# Patient Record
Sex: Female | Born: 1945
Health system: Southern US, Community
[De-identification: ages and names within clinical notes are randomized; demographics above are authoritative.]

## PROBLEM LIST (undated history)

## (undated) DIAGNOSIS — Z808 Family history of malignant neoplasm of other organs or systems: Secondary | ICD-10-CM

## (undated) DIAGNOSIS — R011 Cardiac murmur, unspecified: Secondary | ICD-10-CM

## (undated) DIAGNOSIS — C541 Malignant neoplasm of endometrium: Secondary | ICD-10-CM

## (undated) DIAGNOSIS — IMO0001 Reserved for inherently not codable concepts without codable children: Secondary | ICD-10-CM

## (undated) DIAGNOSIS — Z803 Family history of malignant neoplasm of breast: Secondary | ICD-10-CM

## (undated) DIAGNOSIS — E785 Hyperlipidemia, unspecified: Secondary | ICD-10-CM

## (undated) DIAGNOSIS — J302 Other seasonal allergic rhinitis: Secondary | ICD-10-CM

## (undated) DIAGNOSIS — I10 Essential (primary) hypertension: Secondary | ICD-10-CM

## (undated) DIAGNOSIS — IMO0002 Reserved for concepts with insufficient information to code with codable children: Secondary | ICD-10-CM

## (undated) HISTORY — DX: Essential (primary) hypertension: I10

## (undated) HISTORY — DX: Hyperlipidemia, unspecified: E78.5

## (undated) HISTORY — PX: OTHER SURGICAL HISTORY: SHX169

## (undated) HISTORY — PX: WISDOM TOOTH EXTRACTION: SHX21

## (undated) HISTORY — DX: Family history of malignant neoplasm of other organs or systems: Z80.8

## (undated) HISTORY — PX: TONSILLECTOMY: SUR1361

## (undated) HISTORY — DX: Cardiac murmur, unspecified: R01.1

## (undated) HISTORY — DX: Malignant neoplasm of endometrium: C54.1

## (undated) HISTORY — PX: BREAST SURGERY: SHX581

## (undated) HISTORY — DX: Family history of malignant neoplasm of breast: Z80.3

---

## 1998-06-07 ENCOUNTER — Other Ambulatory Visit: Admission: RE | Admit: 1998-06-07 | Discharge: 1998-06-07 | Payer: Self-pay | Admitting: *Deleted

## 1999-02-12 ENCOUNTER — Other Ambulatory Visit: Admission: RE | Admit: 1999-02-12 | Discharge: 1999-02-12 | Payer: Self-pay | Admitting: *Deleted

## 1999-08-19 ENCOUNTER — Other Ambulatory Visit: Admission: RE | Admit: 1999-08-19 | Discharge: 1999-08-19 | Payer: Self-pay | Admitting: *Deleted

## 1999-12-18 ENCOUNTER — Ambulatory Visit (HOSPITAL_COMMUNITY): Admission: RE | Admit: 1999-12-18 | Discharge: 1999-12-18 | Payer: Self-pay | Admitting: Gastroenterology

## 2000-02-12 ENCOUNTER — Other Ambulatory Visit: Admission: RE | Admit: 2000-02-12 | Discharge: 2000-02-12 | Payer: Self-pay | Admitting: *Deleted

## 2000-09-23 ENCOUNTER — Other Ambulatory Visit: Admission: RE | Admit: 2000-09-23 | Discharge: 2000-09-23 | Payer: Self-pay | Admitting: *Deleted

## 2001-10-19 ENCOUNTER — Other Ambulatory Visit: Admission: RE | Admit: 2001-10-19 | Discharge: 2001-10-19 | Payer: Self-pay | Admitting: *Deleted

## 2002-10-25 ENCOUNTER — Other Ambulatory Visit: Admission: RE | Admit: 2002-10-25 | Discharge: 2002-10-25 | Payer: Self-pay | Admitting: *Deleted

## 2003-10-30 ENCOUNTER — Other Ambulatory Visit: Admission: RE | Admit: 2003-10-30 | Discharge: 2003-10-30 | Payer: Self-pay | Admitting: *Deleted

## 2004-02-25 ENCOUNTER — Encounter: Admission: RE | Admit: 2004-02-25 | Discharge: 2004-02-25 | Payer: Self-pay | Admitting: Internal Medicine

## 2005-03-06 ENCOUNTER — Ambulatory Visit (HOSPITAL_COMMUNITY): Admission: RE | Admit: 2005-03-06 | Discharge: 2005-03-06 | Payer: Self-pay | Admitting: Gastroenterology

## 2008-04-13 ENCOUNTER — Ambulatory Visit: Payer: Self-pay | Admitting: Internal Medicine

## 2008-08-24 ENCOUNTER — Ambulatory Visit: Payer: Self-pay | Admitting: Internal Medicine

## 2009-02-15 ENCOUNTER — Ambulatory Visit: Payer: Self-pay | Admitting: Internal Medicine

## 2009-05-26 ENCOUNTER — Emergency Department (HOSPITAL_BASED_OUTPATIENT_CLINIC_OR_DEPARTMENT_OTHER): Admission: EM | Admit: 2009-05-26 | Discharge: 2009-05-27 | Payer: Self-pay | Admitting: Emergency Medicine

## 2009-05-27 ENCOUNTER — Ambulatory Visit: Payer: Self-pay | Admitting: Interventional Radiology

## 2009-05-27 ENCOUNTER — Ambulatory Visit: Payer: Self-pay | Admitting: Internal Medicine

## 2009-05-31 ENCOUNTER — Ambulatory Visit (HOSPITAL_COMMUNITY): Admission: RE | Admit: 2009-05-31 | Discharge: 2009-05-31 | Payer: Self-pay | Admitting: Internal Medicine

## 2009-06-06 ENCOUNTER — Ambulatory Visit: Payer: Self-pay | Admitting: Internal Medicine

## 2009-06-25 ENCOUNTER — Ambulatory Visit: Payer: Self-pay | Admitting: Internal Medicine

## 2009-08-20 ENCOUNTER — Ambulatory Visit: Payer: Self-pay | Admitting: Internal Medicine

## 2009-09-20 ENCOUNTER — Ambulatory Visit: Payer: Self-pay | Admitting: Internal Medicine

## 2009-11-21 ENCOUNTER — Ambulatory Visit: Payer: Self-pay | Admitting: Internal Medicine

## 2010-02-20 ENCOUNTER — Ambulatory Visit: Payer: Self-pay | Admitting: Internal Medicine

## 2010-03-28 ENCOUNTER — Ambulatory Visit: Payer: Self-pay | Admitting: Internal Medicine

## 2010-06-09 ENCOUNTER — Ambulatory Visit: Payer: Self-pay | Admitting: Internal Medicine

## 2010-09-01 ENCOUNTER — Other Ambulatory Visit: Payer: Self-pay | Admitting: Internal Medicine

## 2010-09-02 ENCOUNTER — Ambulatory Visit (INDEPENDENT_AMBULATORY_CARE_PROVIDER_SITE_OTHER): Payer: BC Managed Care – PPO | Admitting: Internal Medicine

## 2010-09-02 DIAGNOSIS — E785 Hyperlipidemia, unspecified: Secondary | ICD-10-CM

## 2010-09-02 DIAGNOSIS — I10 Essential (primary) hypertension: Secondary | ICD-10-CM

## 2010-10-01 LAB — DIFFERENTIAL
Basophils Absolute: 0.1 10*3/uL (ref 0.0–0.1)
Basophils Relative: 2 % — ABNORMAL HIGH (ref 0–1)
Eosinophils Absolute: 0.1 10*3/uL (ref 0.0–0.7)
Eosinophils Relative: 2 % (ref 0–5)
Lymphocytes Relative: 32 % (ref 12–46)
Lymphs Abs: 2.4 10*3/uL (ref 0.7–4.0)
Monocytes Absolute: 0.7 10*3/uL (ref 0.1–1.0)
Monocytes Relative: 9 % (ref 3–12)
Neutro Abs: 4.1 10*3/uL (ref 1.7–7.7)
Neutrophils Relative %: 56 % (ref 43–77)

## 2010-10-01 LAB — POCT CARDIAC MARKERS
CKMB, poc: 1.1 ng/mL (ref 1.0–8.0)
CKMB, poc: <1 ng/mL — ABNORMAL LOW (ref 1.0–8.0)
Myoglobin, poc: 33 ng/mL (ref 12–200)
Myoglobin, poc: 51.4 ng/mL (ref 12–200)
Troponin i, poc: <0.05 ng/mL (ref 0.00–0.09)
Troponin i, poc: <0.05 ng/mL (ref 0.00–0.09)

## 2010-10-01 LAB — BASIC METABOLIC PANEL
BUN: 15 mg/dL (ref 6–23)
CO2: 26 mEq/L (ref 19–32)
Calcium: 9.7 mg/dL (ref 8.4–10.5)
Chloride: 104 mEq/L (ref 96–112)
Creatinine, Ser: 0.7 mg/dL (ref 0.4–1.2)
GFR calc Af Amer: >60 mL/min (ref >60)
GFR calc non Af Amer: >60 mL/min (ref >60)
Glucose, Bld: 110 mg/dL — ABNORMAL HIGH (ref 70–99)
Potassium: 4.7 mEq/L (ref 3.5–5.1)
Sodium: 142 mEq/L (ref 135–145)

## 2010-10-01 LAB — CBC
HCT: 43.3 % (ref 36.0–46.0)
Hemoglobin: 14.2 g/dL (ref 12.0–15.0)
MCHC: 32.9 g/dL (ref 30.0–36.0)
MCV: 93.8 fL (ref 78.0–100.0)
Platelets: 248 10*3/uL (ref 150–400)
RBC: 4.62 MIL/uL (ref 3.87–5.11)
RDW: 12.2 % (ref 11.5–15.5)
WBC: 7.4 10*3/uL (ref 4.0–10.5)

## 2010-11-14 NOTE — Procedures (Signed)
[place]. [facility]  Patient:    Annette Tucker, Annette Tucker                          MRN: [id number] Proc. Date: 12/18/99 Adm. Date:  [id number] Disc. Date: [id number] Attending:  Charna Elizabeth CC:         Heather Roberts, M.D.                           Procedure Report  DATE OF BIRTH:  10/14/2045  PROCEDURE PERFORMED:  Flexible sigmoidoscopy.  ENDOSCOPIST:  Anselmo Rod, M.D.  INSTRUMENT USED:  Olympus video colonoscope.  INDICATIONS:  Screening flexible sigmoidoscopy being performed in a 65 year old, healthy, white female, rule out colonic polyps, masses, and hemorrhoids.  PREPROCEDURE PREPARATION:  Informed consent was procured.  The patient was fasted for eight hours prior to the procedure and prepped with two Fleets enemas the morning of the procedure.  DESCRIPTION OF PROCEDURE:  A flexible sigmoidoscopy was attempted and there was a large amount of stool in the colon.  Therefore, the scope had to be withdrawn and the patient was reprepped with an additional two Fleets enemas. Once the patient was adequately prepped, she was placed in the left lateral decubitus position.  The Olympus video colonoscope was advanced from the rectum to 80 cm without difficulty.  No masses, polyps, erosions, ulcerations, or diverticula were seen.  The visualization was adequate.  The colonic mucosa appeared healthy and without lesions.  The patient tolerated the procedure well without complications.  A small nonbleeding internal hemorrhoid was seen in retroflexion.  IMPRESSION:  Normal flexible sigmoidoscopy, except for small internal hemorrhoids.  RECOMMENDATIONS:  The patient had been advised to increase the fluids and fiber in her diet and have repeat colorectal cancer screening in the next five years, sooner if she has any symptoms in the interim. DD:  12/18/99 TD:  12/20/99 Job: [id number]

## 2010-11-14 NOTE — Op Note (Signed)
Annette Tucker, Annette Tucker                   ACCOUNT NO.:  [id number]   MEDICAL RECORD NO.:  [id number]          PATIENT TYPE:  AMB   LOCATION:  ENDO                         FACILITY:  [facility]   PHYSICIAN:  Anselmo Rod, M.D.  DATE OF BIRTH:  1946/02/15   DATE OF PROCEDURE:  03/06/2005  DATE OF DISCHARGE:                                 OPERATIVE REPORT   PROCEDURE:  Screening colonoscopy.   ENDOSCOPIST:  Anselmo Rod, M.D.   INSTRUMENT USED:  Olympus video colonoscope.   INDICATIONS FOR PROCEDURE:  A 65 year old white female underwent a screening  colonoscopy to rule out colonic polyps, masses, etc.   PRE-PROCEDURE PREPARATION:  An informed consent was procured from the  patient.  The patient was fasted for eight hours prior to the procedure and  prepped with a bottle of magnesium citrate and one gallon of GoLYTELY on the  night prior to the procedure.  The risks and benefits of the procedure,  including a 10% mid-rate of cancer and polyps were discussed with the  patient as well.   PRE-PROCEDURE PHYSICAL EXAMINATION:  VITAL SIGNS:  Stable.  NECK:  Supple.  CHEST:  Clear to auscultation.  HEART:  S1, S2 regular.  ABDOMEN:  Soft with normal bowel sounds.   DESCRIPTION OF PROCEDURE:  The patient was placed in the left lateral  decubitus position and sedated with 75 mg of Demerol and 7.5 mg of Versed in  slow incremental doses.  She also received 1 gram of Ancef for mitral valve  prolapse prophylaxis. Once the patient was adequately sedated and maintained  on low-flow oxygen and continuous cardiac monitoring, the Olympus video  colonoscope was advanced from the rectum to the cecum.  The appendicular  orifice and the ileocecal valve were clearly visualized and photographed.  No masses, polyps, erosions, ulcerations or diverticula were seen.  Retroflexion in the rectum revealed no abnormalities.  The patient had a  somewhat tortuous colon.  The patient's position had to be changed from  the  left lateral to the supine position.  With gentle application of abdominal  pressure, we reached the cecal base.   The patient tolerated the procedure well without immediate complications.   IMPRESSION:  1.  Normal colonoscopy to the cecum.  No masses or polyps seen.  No evidence      of diverticulosis.  2.  Tortuous colon.   RECOMMENDATIONS:  1.  Continue a high-fiber diet with liberal fluid intake.  2.  Repeat colonoscopy in the next 10 years, unless the patient develops any      abnormal symptoms in the interim.  3.  Outpatient followup as the need arises in the future.      Anselmo Rod, M.D.  Electronically Signed     JNM/MEDQ  D:  03/06/2005  T:  03/06/2005  Job:  [id number]   cc:   Luanna Cole. Lenord Fellers, M.D.  [address]  [place]  [place] [address]  Fax: [phone]   Pershing Cox, M.D.  [address]  [place]  [place] [address]  Fax:  [phone] 

## 2011-02-26 ENCOUNTER — Other Ambulatory Visit: Payer: BC Managed Care – PPO | Admitting: Internal Medicine

## 2011-02-26 DIAGNOSIS — Z0000 Encounter for general adult medical examination without abnormal findings: Secondary | ICD-10-CM

## 2011-02-26 DIAGNOSIS — E785 Hyperlipidemia, unspecified: Secondary | ICD-10-CM

## 2011-02-26 LAB — HEPATIC FUNCTION PANEL
ALT: 13 U/L (ref 0–35)
AST: 19 U/L (ref 0–37)
Albumin: 4.4 g/dL (ref 3.5–5.2)
Alkaline Phosphatase: 63 U/L (ref 39–117)
Bilirubin, Direct: 0.1 mg/dL (ref 0.0–0.3)
Indirect Bilirubin: 0.4 mg/dL (ref 0.0–0.9)
Total Bilirubin: 0.5 mg/dL (ref 0.3–1.2)
Total Protein: 7 g/dL (ref 6.0–8.3)

## 2011-02-26 LAB — CBC WITH DIFFERENTIAL/PLATELET
Basophils Absolute: 0 10*3/uL (ref 0.0–0.1)
Basophils Relative: 0 % (ref 0–1)
Eosinophils Absolute: 0.1 10*3/uL (ref 0.0–0.7)
Eosinophils Relative: 2 % (ref 0–5)
HCT: 42.5 % (ref 36.0–46.0)
Hemoglobin: 13.7 g/dL (ref 12.0–15.0)
Lymphocytes Relative: 38 % (ref 12–46)
Lymphs Abs: 2.1 10*3/uL (ref 0.7–4.0)
MCH: 29.8 pg (ref 26.0–34.0)
MCHC: 32.2 g/dL (ref 30.0–36.0)
MCV: 92.4 fL (ref 78.0–100.0)
Monocytes Absolute: 0.6 10*3/uL (ref 0.1–1.0)
Monocytes Relative: 11 % (ref 3–12)
Neutro Abs: 2.6 10*3/uL (ref 1.7–7.7)
Neutrophils Relative %: 49 % (ref 43–77)
Platelets: 238 10*3/uL (ref 150–400)
RBC: 4.6 MIL/uL (ref 3.87–5.11)
RDW: 13.9 % (ref 11.5–15.5)
WBC: 5.4 10*3/uL (ref 4.0–10.5)

## 2011-02-26 LAB — LIPID PANEL
Cholesterol: 191 mg/dL (ref 0–200)
HDL: 71 mg/dL (ref >39)
LDL Cholesterol: 105 mg/dL — ABNORMAL HIGH (ref 0–99)
Total CHOL/HDL Ratio: 2.7 Ratio
Triglycerides: 76 mg/dL (ref <150)
VLDL: 15 mg/dL (ref 0–40)

## 2011-02-26 LAB — BASIC METABOLIC PANEL WITH GFR
BUN: 15 mg/dL (ref 6–23)
CO2: 25 meq/L (ref 19–32)
Calcium: 9.5 mg/dL (ref 8.4–10.5)
Chloride: 103 meq/L (ref 96–112)
Creat: 0.78 mg/dL (ref 0.50–1.10)
Glucose, Bld: 91 mg/dL (ref 70–99)
Potassium: 4.2 meq/L (ref 3.5–5.3)
Sodium: 140 meq/L (ref 135–145)

## 2011-02-26 LAB — TSH: TSH: 2.473 u[IU]/mL (ref 0.350–4.500)

## 2011-02-27 ENCOUNTER — Encounter: Payer: Self-pay | Admitting: Internal Medicine

## 2011-02-27 ENCOUNTER — Ambulatory Visit (INDEPENDENT_AMBULATORY_CARE_PROVIDER_SITE_OTHER): Payer: BC Managed Care – PPO | Admitting: Internal Medicine

## 2011-02-27 VITALS — BP 124/86 | HR 72 | Temp 98.3°F | Ht 62.75 in | Wt 122.0 lb

## 2011-02-27 DIAGNOSIS — I059 Rheumatic mitral valve disease, unspecified: Secondary | ICD-10-CM

## 2011-02-27 DIAGNOSIS — E785 Hyperlipidemia, unspecified: Secondary | ICD-10-CM | POA: Insufficient documentation

## 2011-02-27 DIAGNOSIS — M8580 Other specified disorders of bone density and structure, unspecified site: Secondary | ICD-10-CM | POA: Insufficient documentation

## 2011-02-27 DIAGNOSIS — M1990 Unspecified osteoarthritis, unspecified site: Secondary | ICD-10-CM

## 2011-02-27 DIAGNOSIS — I341 Nonrheumatic mitral (valve) prolapse: Secondary | ICD-10-CM | POA: Insufficient documentation

## 2011-02-27 DIAGNOSIS — I10 Essential (primary) hypertension: Secondary | ICD-10-CM | POA: Insufficient documentation

## 2011-02-27 DIAGNOSIS — M899 Disorder of bone, unspecified: Secondary | ICD-10-CM

## 2011-02-27 DIAGNOSIS — Z0000 Encounter for general adult medical examination without abnormal findings: Secondary | ICD-10-CM

## 2011-02-27 LAB — POCT URINALYSIS DIPSTICK
Bilirubin, UA: NEGATIVE
Blood, UA: NEGATIVE
Glucose, UA: NEGATIVE
Ketones, UA: NEGATIVE
Leukocytes, UA: NEGATIVE
Nitrite, UA: NEGATIVE
Protein, UA: NEGATIVE
Spec Grav, UA: 1.01
Urobilinogen, UA: NEGATIVE
pH, UA: 6.5

## 2011-02-27 LAB — VITAMIN D 25 HYDROXY (VIT D DEFICIENCY, FRACTURES): Vit D, 25-Hydroxy: 54 ng/mL (ref 30–89)

## 2011-02-27 NOTE — Progress Notes (Signed)
  Subjective:    Patient ID: Annette Tucker, female    DOB: Jun 12, 1946, 65 y.o.   MRN: [id number]  HPI  and in a 75 white female with history of osteoarthritis hips and back, mitral valve prolapse diagnosed in 2001, osteopenia, hyperlipidemia and hypertension in today for evaluation of medical problems. Patient has been maintained on Zocor 20 mg daily, amlodipine 5 mg daily, Altace 5 mg daily, calcium and vitamin D supplementation as well as a baby aspirin daily. Says at times when she checks her blood pressure at time it can be in the low 100s. Sometimes she feels fatigued and falls asleep easily. Explained to her she could try discontinuing Altace for a few weeks to see if that would help percent films; however, we tried discontinuing amlodipine a while back and her blood pressure became elevated once again. I am pleased with her blood pressure today. It is exactly were a wanted. She had a mammogram April 2012, influenza vaccine September 2011, Pneumovax immunization 2003, shingles vaccine September 2011.  Had tonsillectomy at age 45, TMJ mandibular advancement surgery 1986  No other complaints or problems. Fasting lab work within normal limits    Review of Systems  Constitutional: Positive for fatigue.  HENT: Negative.   Eyes: Negative.   Respiratory: Negative.   Cardiovascular: Negative.   Gastrointestinal: Negative.   Genitourinary: Positive for vaginal bleeding.       Patient complains of having vaginal bleeding while using Premarin vaginal cream. She saw GYN physician, Dr. Algie Coffer who did some testing. Patient was to go back for an endometrial biopsy but says when she quit using Premarin vaginal cream to vaginal bleeding stopped  Musculoskeletal: Negative.   Neurological: Negative.   Hematological: Negative.   Psychiatric/Behavioral: Negative.        Objective:   Physical Exam  Vitals reviewed. Constitutional: She is oriented to person, place, and time. She appears well-nourished.  No distress.  HENT:  Head: Normocephalic and atraumatic.  Right Ear: External ear normal.  Mouth/Throat: Oropharynx is clear and moist.  Neck: Neck supple. No JVD present. No thyromegaly present.  Cardiovascular: Normal rate, regular rhythm, normal heart sounds and intact distal pulses.   No murmur heard. Pulmonary/Chest: Effort normal and breath sounds normal. No respiratory distress. She has no rales.  Abdominal: Soft. Bowel sounds are normal. She exhibits no distension and no mass. There is no rebound.  Genitourinary:       Pelvic exam deferred to GYN physician  Musculoskeletal: Normal range of motion. She exhibits no edema.  Lymphadenopathy:    She has no cervical adenopathy.  Neurological: She is alert and oriented to person, place, and time. She has normal reflexes. No cranial nerve deficit. Coordination normal.  Skin: Skin is warm and dry. No rash noted.  Psychiatric: She has a normal mood and affect.          Assessment & Plan:  Hypertension  Hyperlipidemia  Osteopenia  History of mitral valve prolapse  History of osteoarthritis hips and back  For now, advise continue with same medication regimen. Return in 6 months for fasting lipid panel liver functions and office visit.  Patient had colonoscopy 2006 and next one is due 2016. Had bone density study done in GYN office 2009.

## 2011-03-25 ENCOUNTER — Ambulatory Visit (INDEPENDENT_AMBULATORY_CARE_PROVIDER_SITE_OTHER): Payer: BC Managed Care – PPO | Admitting: Internal Medicine

## 2011-03-25 DIAGNOSIS — Z23 Encounter for immunization: Secondary | ICD-10-CM

## 2011-04-20 ENCOUNTER — Encounter: Payer: Self-pay | Admitting: Internal Medicine

## 2011-08-31 ENCOUNTER — Other Ambulatory Visit: Payer: BC Managed Care – PPO | Admitting: Internal Medicine

## 2011-08-31 DIAGNOSIS — E785 Hyperlipidemia, unspecified: Secondary | ICD-10-CM

## 2011-08-31 DIAGNOSIS — Z79899 Other long term (current) drug therapy: Secondary | ICD-10-CM

## 2011-09-01 ENCOUNTER — Ambulatory Visit (INDEPENDENT_AMBULATORY_CARE_PROVIDER_SITE_OTHER): Payer: BC Managed Care – PPO | Admitting: Internal Medicine

## 2011-09-01 ENCOUNTER — Encounter: Payer: Self-pay | Admitting: Internal Medicine

## 2011-09-01 DIAGNOSIS — M549 Dorsalgia, unspecified: Secondary | ICD-10-CM

## 2011-09-01 DIAGNOSIS — I10 Essential (primary) hypertension: Secondary | ICD-10-CM

## 2011-09-01 DIAGNOSIS — E785 Hyperlipidemia, unspecified: Secondary | ICD-10-CM

## 2011-09-01 LAB — LIPID PANEL
Cholesterol: 167 mg/dL (ref 0–200)
HDL: 69 mg/dL (ref >39)
LDL Cholesterol: 85 mg/dL (ref 0–99)
Total CHOL/HDL Ratio: 2.4 Ratio
Triglycerides: 63 mg/dL (ref <150)
VLDL: 13 mg/dL (ref 0–40)

## 2011-09-01 LAB — HEPATIC FUNCTION PANEL
ALT: 13 U/L (ref 0–35)
AST: 25 U/L (ref 0–37)
Albumin: 4.4 g/dL (ref 3.5–5.2)
Alkaline Phosphatase: 83 U/L (ref 39–117)
Bilirubin, Direct: 0.1 mg/dL (ref 0.0–0.3)
Indirect Bilirubin: 0.4 mg/dL (ref 0.0–0.9)
Total Bilirubin: 0.5 mg/dL (ref 0.3–1.2)
Total Protein: 7.2 g/dL (ref 6.0–8.3)

## 2011-09-01 NOTE — Progress Notes (Signed)
  Subjective:    Patient ID: Annette Tucker, female    DOB: [birth year], 66 y.o.   MRN: [id number]  HPI 66 year old white female with history of hypertension and hyperlipidemia in today for six-month recheck. Has some occasional low back pain. Walks 2 miles daily on a treadmill. Went to see dermatologist and had some lesions removed that were benign. Sees Dr. Algie Coffer at [facility] for GYN care. Had some vaginal discharge. Stopped using Premarin vaginal cream and discharge stopped. Immunizations are up-to-date. Has had colonoscopy.    Review of Systems     Objective:   Physical Exam neck is supple without thyromegaly or carotid bruits; chest clear to auscultation; cardiac exam regular rate and rhythm; extremities without edema.        Assessment & Plan:  Hypertension  Hyperlipidemia  Plan: Continue same regimen and return in 6 months for physical examination. Lab work reviewed with her today including lipid panel and liver functions entirely within normal limits.

## 2011-09-01 NOTE — Patient Instructions (Signed)
Continue same medications. Return in early September for physical examination.

## 2011-09-29 ENCOUNTER — Encounter (HOSPITAL_BASED_OUTPATIENT_CLINIC_OR_DEPARTMENT_OTHER): Payer: Self-pay | Admitting: *Deleted

## 2011-09-29 ENCOUNTER — Emergency Department (HOSPITAL_BASED_OUTPATIENT_CLINIC_OR_DEPARTMENT_OTHER)
Admission: EM | Admit: 2011-09-29 | Discharge: 2011-09-29 | Disposition: A | Discharge status: Home / Self Care | Payer: Worker's Compensation | Attending: Emergency Medicine | Admitting: Emergency Medicine

## 2011-09-29 ENCOUNTER — Emergency Department (INDEPENDENT_AMBULATORY_CARE_PROVIDER_SITE_OTHER): Payer: Worker's Compensation

## 2011-09-29 DIAGNOSIS — M25569 Pain in unspecified knee: Secondary | ICD-10-CM | POA: Insufficient documentation

## 2011-09-29 DIAGNOSIS — W1809XA Striking against other object with subsequent fall, initial encounter: Secondary | ICD-10-CM

## 2011-09-29 DIAGNOSIS — I10 Essential (primary) hypertension: Secondary | ICD-10-CM | POA: Insufficient documentation

## 2011-09-29 DIAGNOSIS — S8000XA Contusion of unspecified knee, initial encounter: Secondary | ICD-10-CM | POA: Insufficient documentation

## 2011-09-29 DIAGNOSIS — Z79899 Other long term (current) drug therapy: Secondary | ICD-10-CM | POA: Insufficient documentation

## 2011-09-29 DIAGNOSIS — W19XXXA Unspecified fall, initial encounter: Secondary | ICD-10-CM

## 2011-09-29 DIAGNOSIS — R040 Epistaxis: Secondary | ICD-10-CM

## 2011-09-29 DIAGNOSIS — Z7982 Long term (current) use of aspirin: Secondary | ICD-10-CM | POA: Insufficient documentation

## 2011-09-29 DIAGNOSIS — R220 Localized swelling, mass and lump, head: Secondary | ICD-10-CM | POA: Insufficient documentation

## 2011-09-29 DIAGNOSIS — IMO0002 Reserved for concepts with insufficient information to code with codable children: Secondary | ICD-10-CM | POA: Insufficient documentation

## 2011-09-29 DIAGNOSIS — S0990XA Unspecified injury of head, initial encounter: Secondary | ICD-10-CM

## 2011-09-29 DIAGNOSIS — R51 Headache: Secondary | ICD-10-CM | POA: Insufficient documentation

## 2011-09-29 DIAGNOSIS — W101XXA Fall (on)(from) sidewalk curb, initial encounter: Secondary | ICD-10-CM | POA: Insufficient documentation

## 2011-09-29 DIAGNOSIS — S0993XA Unspecified injury of face, initial encounter: Secondary | ICD-10-CM

## 2011-09-29 DIAGNOSIS — E785 Hyperlipidemia, unspecified: Secondary | ICD-10-CM | POA: Insufficient documentation

## 2011-09-29 DIAGNOSIS — M810 Age-related osteoporosis without current pathological fracture: Secondary | ICD-10-CM | POA: Insufficient documentation

## 2011-09-29 DIAGNOSIS — M129 Arthropathy, unspecified: Secondary | ICD-10-CM | POA: Insufficient documentation

## 2011-09-29 DIAGNOSIS — S0003XA Contusion of scalp, initial encounter: Secondary | ICD-10-CM | POA: Insufficient documentation

## 2011-09-29 DIAGNOSIS — S0083XA Contusion of other part of head, initial encounter: Secondary | ICD-10-CM

## 2011-09-29 MED ORDER — IBUPROFEN 800 MG PO TABS: 800.0000 mg | ORAL_TABLET | Freq: Three times a day (TID) | ORAL | Status: AC

## 2011-09-29 MED ORDER — TETANUS-DIPHTH-ACELL PERTUSSIS 5-2.5-18.5 LF-MCG/0.5 IM SUSP
0.5000 mL | Freq: Once | INTRAMUSCULAR | Status: AC
  Administered 2011-09-29: 0.5 mL via INTRAMUSCULAR
  Filled 2011-09-29: qty 0.5

## 2011-09-29 NOTE — ED Provider Notes (Signed)
History     CSN: [id number]  Arrival date & time 09/29/11  1733   First MD Initiated Contact with Patient 09/29/11 1803      Chief Complaint  Patient presents with  . Abrasion  . Epistaxis    (Consider location/radiation/quality/duration/timing/severity/associated sxs/prior treatment) HPI Comments: Patient a mechanical fall this afternoon when she tripped on a curb and scraped her face and left knee on the sidewalk. She remembers incident and that was consciousness. She complains of nose pain and abrasion to her for head and left knee. She did have some nosebleed initially that has since resolved. Denies any chest pain, shortness of breath no abdominal pain, back pain. She's no pain in her neck, numbness, tingling or weakness. She denies any dizziness or lightheadedness prior to the fall.  The history is provided by the patient.    Past Medical History  Diagnosis Date  . Arthritis   . Heart murmur     mitralvalve prolapse  . Osteoporosis     osteopenia  . Hyperlipidemia   . Hypertension     Past Surgical History  Procedure Date  . Tonsillectomy     age 34  . Tmj mandibular advancement     Family History  Problem Relation Age of Onset  . Mental retardation Mother   . Stroke Mother   . Kidney disease Mother     History  Substance Use Topics  . Smoking status: Never Smoker   . Smokeless tobacco: Never Used  . Alcohol Use: Yes     rarely    OB History    Grav Para Term Preterm Abortions TAB SAB Ect Mult Living                  Review of Systems  Constitutional: Negative for fever.  HENT: Positive for nosebleeds and facial swelling. Negative for neck pain and neck stiffness.   Eyes: Negative for photophobia.  Respiratory: Negative for cough, chest tightness and shortness of breath.   Cardiovascular: Negative for chest pain.  Gastrointestinal: Negative for nausea, vomiting and abdominal pain.  Genitourinary: Negative for dysuria.  Musculoskeletal: Positive  for joint swelling. Negative for back pain.  Skin: Negative for rash.  Neurological: Positive for headaches.    Allergies  Latex  Home Medications   Current Outpatient Rx  Name Route Sig Dispense Refill  . ACETAMINOPHEN 500 MG PO TABS Oral Take 500 mg by mouth once as needed. For pain    . AMLODIPINE BESYLATE 5 MG PO TABS Oral Take 5 mg by mouth daily.     . ASPIRIN 81 MG PO TBEC Oral Take 81 mg by mouth every evening.     Marland Kitchen CALCIUM CARBONATE 600 MG PO TABS Oral Take 600 mg by mouth 2 (two) times daily with a meal.      . VITAMIN D 1000 UNITS PO TABS Oral Take 1,000 Units by mouth daily.    . CO Q-10 100 MG PO CAPS Oral Take 1 capsule by mouth daily.    . OMEGA-3 FATTY ACIDS 1000 MG PO CAPS Oral Take 1 g by mouth daily.     Marland Kitchen FLAX SEED OIL PO Oral Take 1 capsule by mouth every evening.     Marland Kitchen LORATADINE 10 MG PO TABS Oral Take 10 mg by mouth daily.    Marland Kitchen MAGNESIUM 250 MG PO TABS Oral Take 1 tablet by mouth every evening.     . MULTI-VITAMIN/MINERALS PO TABS Oral Take 1 tablet by mouth daily.      Marland Kitchen  RAMIPRIL 5 MG PO CAPS Oral Take 5 mg by mouth daily.      Marland Kitchen SIMVASTATIN 20 MG PO TABS Oral Take 20 mg by mouth at bedtime.      Marland Kitchen VITAMIN E 400 UNITS PO CAPS Oral Take 400 Units by mouth daily.      . IBUPROFEN 800 MG PO TABS Oral Take 1 tablet (800 mg total) by mouth 3 (three) times daily. 21 tablet 0    BP 127/94  Pulse 100  Temp(Src) 98 F (36.7 C) (Oral)  Resp 20  Wt 52 lb 9 oz (23.842 kg)  SpO2 100%  Physical Exam  Constitutional: She is oriented to person, place, and time. She appears well-developed and well-nourished. No distress.  HENT:  Head: Normocephalic and atraumatic.  Right Ear: External ear normal.  Left Ear: External ear normal.  Mouth/Throat: Oropharynx is clear and moist. No oropharyngeal exudate.       No septal hematoma or hemotympanum. Abrasion to the midforehead and bridge of nose with ecchymosis Dried blood in left nare No malocclusion  Eyes:  Conjunctivae and EOM are normal. Pupils are equal, round, and reactive to light.  Neck: Normal range of motion. Neck supple.       No C-spine pain, step-off or deformity  Cardiovascular: Normal rate, regular rhythm and normal heart sounds.   Pulmonary/Chest: Effort normal and breath sounds normal. No respiratory distress.  Abdominal: Soft. There is no tenderness. There is no rebound and no guarding.  Musculoskeletal: She exhibits tenderness.       Ecchymosis and abrasion to her left knee with full range of motion  Neurological: She is alert and oriented to person, place, and time. No cranial nerve deficit.  Skin: Skin is warm.    ED Course  Procedures (including critical care time)  Labs Reviewed - No data to display Ct Head Wo Contrast  09/29/2011  *RADIOLOGY REPORT*  Clinical Data:  Larey Seat.  Hit face and head.  CT HEAD WITHOUT CONTRAST CT MAXILLOFACIAL WITHOUT CONTRAST  Technique:  Multidetector CT imaging of the head and maxillofacial structures were performed using the standard protocol without intravenous contrast. Multiplanar CT image reconstructions of the maxillofacial structures were also generated.  Comparison:  None  CT HEAD  Findings: The ventricles are normal.  No extra-axial fluid collections are seen.  The brainstem and cerebellum are unremarkable.  No acute intracranial findings such as infarction or hemorrhage.  No mass lesions.  The bony calvarium is intact.  The visualized paranasal sinuses and mastoid air cells are clear.  IMPRESSION: No acute intracranial findings or skull fracture.  CT MAXILLOFACIAL  Findings:   No facial bone fractures are identified.  There are small screws and both sides of the mandible likely from previous trauma fixation.  No acute mandible fracture.  The mandibular condyles are normally located.  The globes are intact.  The paranasal sinuses mastoid air cells are clear.  There is marked deviation of the bony nasal septum leftward with leftward spurring and  narrowing of the left inferior meatus. Moderate mucosal thickening of both inferior turbinates is noted. The ostiomeatal complexes are patent.  IMPRESSION: No acute facial bone fractures.  Original Report Authenticated By: P. Loralie Champagne, M.D.   Dg Knee Complete 4 Views Left  09/29/2011  *RADIOLOGY REPORT*  Clinical Data: Left knee pain.  LEFT KNEE - COMPLETE 4+ VIEW  Comparison: None  Findings: The joint spaces are maintained.  Minimal degenerative changes.  No acute fracture or osteochondral lesion.  No joint effusion.  IMPRESSION: Minimal degenerative changes.  No acute bony findings.  Original Report Authenticated By: P. Loralie Champagne, M.D.   Ct Maxillofacial Wo Cm  09/29/2011  *RADIOLOGY REPORT*  Clinical Data:  Larey Seat.  Hit face and head.  CT HEAD WITHOUT CONTRAST CT MAXILLOFACIAL WITHOUT CONTRAST  Technique:  Multidetector CT imaging of the head and maxillofacial structures were performed using the standard protocol without intravenous contrast. Multiplanar CT image reconstructions of the maxillofacial structures were also generated.  Comparison:  None  CT HEAD  Findings: The ventricles are normal.  No extra-axial fluid collections are seen.  The brainstem and cerebellum are unremarkable.  No acute intracranial findings such as infarction or hemorrhage.  No mass lesions.  The bony calvarium is intact.  The visualized paranasal sinuses and mastoid air cells are clear.  IMPRESSION: No acute intracranial findings or skull fracture.  CT MAXILLOFACIAL  Findings:   No facial bone fractures are identified.  There are small screws and both sides of the mandible likely from previous trauma fixation.  No acute mandible fracture.  The mandibular condyles are normally located.  The globes are intact.  The paranasal sinuses mastoid air cells are clear.  There is marked deviation of the bony nasal septum leftward with leftward spurring and narrowing of the left inferior meatus. Moderate mucosal thickening of both  inferior turbinates is noted. The ostiomeatal complexes are patent.  IMPRESSION: No acute facial bone fractures.  Original Report Authenticated By: P. Loralie Champagne, M.D.     1. Fall   2. Facial contusion       MDM  Mechanical fall with abrasion to face and knee. Neurologically intact.  No active epistaxis or septal hematoma.  Imaging negative for fractures. Wounds cleaned.  Tetanus up to date.   Will treat contusions and abrasions.  Return precautions discussed.        Glynn Octave, MD 09/30/11 1009

## 2011-09-29 NOTE — ED Notes (Signed)
Pt fell at 3 pm today fell onto face scraped face and left knee on concrete no loss of consciousness pt reports nose started bleeding during the fall but has since stopped swelling noted to nose

## 2011-09-29 NOTE — ED Notes (Signed)
Vitals and weight at 1751 are incorrect. RN Earlene Plater

## 2011-09-29 NOTE — Discharge Instructions (Signed)

## 2012-03-04 ENCOUNTER — Other Ambulatory Visit: Payer: BC Managed Care – PPO | Admitting: Internal Medicine

## 2012-03-04 DIAGNOSIS — Z79899 Other long term (current) drug therapy: Secondary | ICD-10-CM

## 2012-03-04 DIAGNOSIS — I10 Essential (primary) hypertension: Secondary | ICD-10-CM

## 2012-03-04 DIAGNOSIS — Z0000 Encounter for general adult medical examination without abnormal findings: Secondary | ICD-10-CM

## 2012-03-04 DIAGNOSIS — E785 Hyperlipidemia, unspecified: Secondary | ICD-10-CM

## 2012-03-04 LAB — CBC WITH DIFFERENTIAL/PLATELET
Basophils Absolute: 0 10*3/uL (ref 0.0–0.1)
Basophils Relative: 1 % (ref 0–1)
Eosinophils Absolute: 0.1 10*3/uL (ref 0.0–0.7)
Eosinophils Relative: 2 % (ref 0–5)
HCT: 39.8 % (ref 36.0–46.0)
Hemoglobin: 13.8 g/dL (ref 12.0–15.0)
Lymphocytes Relative: 39 % (ref 12–46)
Lymphs Abs: 1.7 10*3/uL (ref 0.7–4.0)
MCH: 30.4 pg (ref 26.0–34.0)
MCHC: 34.7 g/dL (ref 30.0–36.0)
MCV: 87.7 fL (ref 78.0–100.0)
Monocytes Absolute: 0.4 10*3/uL (ref 0.1–1.0)
Monocytes Relative: 10 % (ref 3–12)
Neutro Abs: 2 10*3/uL (ref 1.7–7.7)
Neutrophils Relative %: 48 % (ref 43–77)
Platelets: 280 10*3/uL (ref 150–400)
RBC: 4.54 MIL/uL (ref 3.87–5.11)
RDW: 13.9 % (ref 11.5–15.5)
WBC: 4.2 10*3/uL (ref 4.0–10.5)

## 2012-03-04 LAB — LIPID PANEL
Cholesterol: 176 mg/dL (ref 0–200)
HDL: 68 mg/dL (ref >39)
LDL Cholesterol: 96 mg/dL (ref 0–99)
Total CHOL/HDL Ratio: 2.6 Ratio
Triglycerides: 59 mg/dL (ref <150)
VLDL: 12 mg/dL (ref 0–40)

## 2012-03-04 LAB — COMPREHENSIVE METABOLIC PANEL
ALT: 14 U/L (ref 0–35)
AST: 20 U/L (ref 0–37)
Albumin: 4.5 g/dL (ref 3.5–5.2)
Alkaline Phosphatase: 63 U/L (ref 39–117)
BUN: 15 mg/dL (ref 6–23)
CO2: 24 mEq/L (ref 19–32)
Calcium: 9.7 mg/dL (ref 8.4–10.5)
Chloride: 102 mEq/L (ref 96–112)
Creat: 0.78 mg/dL (ref 0.50–1.10)
Glucose, Bld: 89 mg/dL (ref 70–99)
Potassium: 4.2 mEq/L (ref 3.5–5.3)
Sodium: 136 mEq/L (ref 135–145)
Total Bilirubin: 0.6 mg/dL (ref 0.3–1.2)
Total Protein: 7 g/dL (ref 6.0–8.3)

## 2012-03-04 LAB — TSH: TSH: 2.453 u[IU]/mL (ref 0.350–4.500)

## 2012-03-05 LAB — VITAMIN D 25 HYDROXY (VIT D DEFICIENCY, FRACTURES): Vit D, 25-Hydroxy: 71 ng/mL (ref 30–89)

## 2012-03-07 ENCOUNTER — Other Ambulatory Visit: Payer: Self-pay | Admitting: Internal Medicine

## 2012-03-08 ENCOUNTER — Encounter: Payer: Self-pay | Admitting: Internal Medicine

## 2012-03-08 ENCOUNTER — Ambulatory Visit (INDEPENDENT_AMBULATORY_CARE_PROVIDER_SITE_OTHER): Payer: BC Managed Care – PPO | Admitting: Internal Medicine

## 2012-03-08 VITALS — BP 122/84 | HR 80 | Temp 97.7°F | Ht 63.0 in | Wt 124.0 lb

## 2012-03-08 DIAGNOSIS — M899 Disorder of bone, unspecified: Secondary | ICD-10-CM

## 2012-03-08 DIAGNOSIS — I10 Essential (primary) hypertension: Secondary | ICD-10-CM

## 2012-03-08 DIAGNOSIS — E785 Hyperlipidemia, unspecified: Secondary | ICD-10-CM

## 2012-03-08 DIAGNOSIS — Z0000 Encounter for general adult medical examination without abnormal findings: Secondary | ICD-10-CM

## 2012-03-08 DIAGNOSIS — M8580 Other specified disorders of bone density and structure, unspecified site: Secondary | ICD-10-CM

## 2012-03-08 DIAGNOSIS — Z8739 Personal history of other diseases of the musculoskeletal system and connective tissue: Secondary | ICD-10-CM

## 2012-03-08 DIAGNOSIS — M1990 Unspecified osteoarthritis, unspecified site: Secondary | ICD-10-CM

## 2012-03-08 DIAGNOSIS — Z8679 Personal history of other diseases of the circulatory system: Secondary | ICD-10-CM

## 2012-03-08 LAB — POCT URINALYSIS DIPSTICK
Bilirubin, UA: NEGATIVE
Blood, UA: NEGATIVE
Glucose, UA: NEGATIVE
Ketones, UA: NEGATIVE
Leukocytes, UA: NEGATIVE
Nitrite, UA: NEGATIVE
Protein, UA: NEGATIVE
Spec Grav, UA: <=1.005
Urobilinogen, UA: NEGATIVE
pH, UA: 7

## 2012-03-08 MED ORDER — SIMVASTATIN 20 MG PO TABS: 20.0000 mg | ORAL_TABLET | Freq: Every day | ORAL | Status: DC

## 2012-03-08 MED ORDER — AMLODIPINE BESYLATE 5 MG PO TABS: 5.0000 mg | ORAL_TABLET | Freq: Every day | ORAL | Status: DC

## 2012-03-08 MED ORDER — RAMIPRIL 5 MG PO CAPS: 5.0000 mg | ORAL_CAPSULE | Freq: Every day | ORAL | Status: DC

## 2012-03-08 NOTE — Patient Instructions (Addendum)
Continue same meds and return in one year. 

## 2012-03-10 ENCOUNTER — Other Ambulatory Visit: Payer: Self-pay | Admitting: Internal Medicine

## 2012-03-11 ENCOUNTER — Encounter: Payer: Self-pay | Admitting: Internal Medicine

## 2012-03-30 ENCOUNTER — Ambulatory Visit (INDEPENDENT_AMBULATORY_CARE_PROVIDER_SITE_OTHER): Payer: BC Managed Care – PPO | Admitting: Internal Medicine

## 2012-03-30 DIAGNOSIS — Z23 Encounter for immunization: Secondary | ICD-10-CM

## 2012-05-24 ENCOUNTER — Encounter: Payer: Self-pay | Admitting: Internal Medicine

## 2012-05-24 NOTE — Progress Notes (Signed)
Subjective:    Patient ID: Annette Tucker, female    DOB: [birth year], 66 y.o.   MRN: [id number]  HPI 66 year old white female in today for health maintenance and evaluation of medical problems. History of hyperlipidemia, hypertension, osteopenia, back pain, osteoarthritis. Patient has a new lesion left forehead she wants me to check.   Was diagnosed with mitral valve prolapse in 2001 by Dr. Caprice Kluver on echocardiogram. She has been maintained on Zocor 20 mg daily, amlodipine 5 mg daily, Altase 5 mg daily, calcium and vitamin D supplementation. Also takes baby aspirin daily.  Had Pneumovax immunization 2003, zoster vaccine September 2011.  Has seen Dr. Algie Coffer for GYN care. At one point developed vaginal bleeding while using Premarin cream. Patient was to go back for endometrial biopsy but says when she quit using the cream to vaginal bleeding stopped. Needs followup with GYN.  Had tonsillectomy at age 40, TMJ mandibular advancement surgery 1986.   Patient took Fosamax for couple of years but GYN told her to stop it in 2008. She had a colonoscopy in September 2006.  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 She is a native of [place], [place]. Mother lives with her. She does not smoke or consume alcohol. He is single.  Family history: Mother has Alzheimer's disease. Father died at age 66 with kidney disease with history of MI, hypertension, and CVA. One brother in good health with hyperlipidemia.    Review of Systems  Constitutional: Negative.   HENT: Negative.   Eyes: Negative.   Respiratory: Negative.   Cardiovascular: Negative.   Gastrointestinal: Negative.   Genitourinary: Negative.   Musculoskeletal: Negative.   Skin: Negative.         New forehead lesion  Hematological: Negative.   Psychiatric/Behavioral: Negative.        Objective:   Physical Exam  Vitals reviewed. Constitutional: She is oriented to person, place, and time. She appears well-developed and well-nourished. No distress.  HENT:  Head: Normocephalic and atraumatic.  Right Ear: External ear normal.  Left Ear: External ear normal.  Mouth/Throat: Oropharynx is clear and moist. No oropharyngeal exudate.  Eyes: Conjunctivae normal and EOM are normal. Pupils are equal, round, and reactive to light. Right eye exhibits no discharge. Left eye exhibits no discharge. No scleral icterus.  Neck: Neck supple. No JVD present. No thyromegaly present.  Cardiovascular: Normal rate, regular rhythm, normal heart sounds and intact distal pulses.   No murmur heard. Pulmonary/Chest: Effort normal and breath sounds normal. No respiratory distress. She has no wheezes. She has no rales. She exhibits no tenderness.  Abdominal: Soft. Bowel sounds are normal. She exhibits no distension and no mass. There is no tenderness. There is no rebound and no guarding.  Genitourinary:       Deferred to GYN  Musculoskeletal: She exhibits no edema.  Lymphadenopathy:    She has no cervical adenopathy.  Neurological: She is alert and oriented to person, place, and time. She has normal reflexes. She displays normal reflexes. No cranial nerve deficit. Coordination normal.  Skin: Skin is warm and dry. No rash noted. She is not diaphoretic.       Seborrheic keratosis left forehead  Psychiatric: She has a normal mood and affect. Her behavior is normal. Judgment and thought content normal.          Assessment & Plan:  Hypertension-well-controlled on current regimen  Hyperlipidemia-stable on Zocor  History of mitral valve prolapse diagnosed in 2001 by echocardiogram-asymptomatic does not need SBE prophylaxis  Osteoarthritis of hips and back-stable on glucosamine and chondroitin sulfate  History of osteopenia treated with vitamin D and calcium.  New keratosis forehead     Plan: Patient is  to return in 6 months for office visit lipid panel liver functions and blood pressure check. If she prefers we can see her in one year if she is concerned about expense.

## 2012-11-21 ENCOUNTER — Other Ambulatory Visit: Payer: Self-pay | Admitting: Internal Medicine

## 2013-02-22 ENCOUNTER — Other Ambulatory Visit: Payer: Self-pay | Admitting: Internal Medicine

## 2013-03-10 ENCOUNTER — Other Ambulatory Visit: Payer: Self-pay | Admitting: Internal Medicine

## 2013-03-13 ENCOUNTER — Other Ambulatory Visit: Payer: BC Managed Care – PPO | Admitting: Internal Medicine

## 2013-03-13 DIAGNOSIS — I10 Essential (primary) hypertension: Secondary | ICD-10-CM

## 2013-03-13 DIAGNOSIS — Z130 Encounter for screening for diseases of the blood and blood-forming organs and certain disorders involving the immune mechanism: Secondary | ICD-10-CM

## 2013-03-13 DIAGNOSIS — Z1329 Encounter for screening for other suspected endocrine disorder: Secondary | ICD-10-CM

## 2013-03-13 DIAGNOSIS — E785 Hyperlipidemia, unspecified: Secondary | ICD-10-CM

## 2013-03-13 LAB — CBC WITH DIFFERENTIAL/PLATELET
Basophils Absolute: 0 10*3/uL (ref 0.0–0.1)
Basophils Relative: 1 % (ref 0–1)
Eosinophils Absolute: 0.1 10*3/uL (ref 0.0–0.7)
Eosinophils Relative: 2 % (ref 0–5)
HCT: 41.7 % (ref 36.0–46.0)
Hemoglobin: 14.4 g/dL (ref 12.0–15.0)
Lymphocytes Relative: 42 % (ref 12–46)
Lymphs Abs: 2 10*3/uL (ref 0.7–4.0)
MCH: 30.3 pg (ref 26.0–34.0)
MCHC: 34.5 g/dL (ref 30.0–36.0)
MCV: 87.8 fL (ref 78.0–100.0)
Monocytes Absolute: 0.5 10*3/uL (ref 0.1–1.0)
Monocytes Relative: 9 % (ref 3–12)
Neutro Abs: 2.3 10*3/uL (ref 1.7–7.7)
Neutrophils Relative %: 46 % (ref 43–77)
Platelets: 272 10*3/uL (ref 150–400)
RBC: 4.75 MIL/uL (ref 3.87–5.11)
RDW: 14.1 % (ref 11.5–15.5)
WBC: 4.8 10*3/uL (ref 4.0–10.5)

## 2013-03-13 LAB — COMPREHENSIVE METABOLIC PANEL
ALT: 14 U/L (ref 0–35)
AST: 20 U/L (ref 0–37)
Albumin: 4.6 g/dL (ref 3.5–5.2)
Alkaline Phosphatase: 68 U/L (ref 39–117)
BUN: 14 mg/dL (ref 6–23)
CO2: 29 mEq/L (ref 19–32)
Calcium: 10.1 mg/dL (ref 8.4–10.5)
Chloride: 101 mEq/L (ref 96–112)
Creat: 0.87 mg/dL (ref 0.50–1.10)
Glucose, Bld: 90 mg/dL (ref 70–99)
Potassium: 4.4 mEq/L (ref 3.5–5.3)
Sodium: 138 mEq/L (ref 135–145)
Total Bilirubin: 0.7 mg/dL (ref 0.3–1.2)
Total Protein: 7.5 g/dL (ref 6.0–8.3)

## 2013-03-13 LAB — LIPID PANEL
Cholesterol: 192 mg/dL (ref 0–200)
HDL: 73 mg/dL (ref >39)
LDL Cholesterol: 105 mg/dL — ABNORMAL HIGH (ref 0–99)
Total CHOL/HDL Ratio: 2.6 Ratio
Triglycerides: 70 mg/dL (ref <150)
VLDL: 14 mg/dL (ref 0–40)

## 2013-03-14 ENCOUNTER — Encounter: Payer: Self-pay | Admitting: Internal Medicine

## 2013-03-14 ENCOUNTER — Ambulatory Visit (INDEPENDENT_AMBULATORY_CARE_PROVIDER_SITE_OTHER): Payer: BC Managed Care – PPO | Admitting: Internal Medicine

## 2013-03-14 VITALS — BP 106/66 | HR 80 | Ht 63.0 in | Wt 121.0 lb

## 2013-03-14 DIAGNOSIS — R609 Edema, unspecified: Secondary | ICD-10-CM

## 2013-03-14 DIAGNOSIS — Z8679 Personal history of other diseases of the circulatory system: Secondary | ICD-10-CM

## 2013-03-14 DIAGNOSIS — Z23 Encounter for immunization: Secondary | ICD-10-CM

## 2013-03-14 DIAGNOSIS — M899 Disorder of bone, unspecified: Secondary | ICD-10-CM

## 2013-03-14 DIAGNOSIS — I10 Essential (primary) hypertension: Secondary | ICD-10-CM

## 2013-03-14 DIAGNOSIS — Z0000 Encounter for general adult medical examination without abnormal findings: Secondary | ICD-10-CM

## 2013-03-14 DIAGNOSIS — M8580 Other specified disorders of bone density and structure, unspecified site: Secondary | ICD-10-CM

## 2013-03-14 DIAGNOSIS — E785 Hyperlipidemia, unspecified: Secondary | ICD-10-CM

## 2013-03-14 LAB — POCT URINALYSIS DIPSTICK
Bilirubin, UA: NEGATIVE
Blood, UA: NEGATIVE
Glucose, UA: NEGATIVE
Ketones, UA: NEGATIVE
Leukocytes, UA: NEGATIVE
Nitrite, UA: NEGATIVE
Protein, UA: NEGATIVE
Spec Grav, UA: 1.01
Urobilinogen, UA: NEGATIVE
pH, UA: 6.5

## 2013-03-14 LAB — TSH: TSH: 2.718 u[IU]/mL (ref 0.350–4.500)

## 2013-03-14 LAB — VITAMIN D 25 HYDROXY (VIT D DEFICIENCY, FRACTURES): Vit D, 25-Hydroxy: 69 ng/mL (ref 30–89)

## 2013-03-14 NOTE — Progress Notes (Signed)
Subjective:    Patient ID: Annette Tucker, female    DOB: August 28, 1945, 67 y.o.   MRN: [id number]  HPI 67 year old White female for health maintenance and evaluation of medical problems. Went to [place] recently and had dependent edema on trip which could have exacerbated by amlodipine, salt intake, flying for an extended period of time.   She has a history of hyperlipidemia, hypertension, osteopenia, back pain and osteoarthritis.  Was diagnosed with mitral valve prolapse in 2001 by Dr. Caprice Kluver on echocardiogram. She has been maintained on Zocor, amlodipine, Altace, calcium and vitamin D supplement. She takes baby aspirin daily.  Had Pneumovax immunization 2003, Zostavax vaccine September 2011.  Has seen Dr. Algie Coffer for GYN care. At one point developed vaginal bleeding while using Premarin cream. Patient was to go back for endometrial biopsy but says when she quit using the cream to vaginal bleeding stopped.  Additional past medical history: Tonsillectomy at age 24, TMJ mandibular advancement surgery 1986. Colonoscopy September 2006. Patient took Fosamax for a couple of years but gynecologist told her to stop it in 2008.  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 [place]. Her mother lives with her. She does not smoke or consume alcohol. She is single.  Family history: Mother has Alzheimer's disease. Father died at age 73 with kidney disease with history of MI, hypertension and CVA. One brother in good health with hyperlipidemia.    Review of Systems  Constitutional:       Falls aslepp in afternoon and BP running low  HENT: Negative.   Eyes: Negative.   Respiratory: Negative.   Cardiovascular: Negative.   Gastrointestinal: Negative.   Endocrine: Negative.   Genitourinary: Negative.   Allergic/Immunologic: Positive for environmental  allergies.  Neurological: Negative.   Hematological: Negative.   Psychiatric/Behavioral: Negative.        Objective:   Physical Exam  Vitals reviewed. Constitutional: She is oriented to person, place, and time. She appears well-developed and well-nourished. No distress.  Falls asleep when BP is running low.  HENT:  Head: Normocephalic and atraumatic.  Right Ear: External ear normal.  Left Ear: External ear normal.  Mouth/Throat: Oropharynx is clear and moist. No oropharyngeal exudate.  Eyes: Conjunctivae and EOM are normal. Pupils are equal, round, and reactive to light. Right eye exhibits no discharge. Left eye exhibits no discharge. No scleral icterus.  Neck: Neck supple. No JVD present. No thyromegaly present.  Cardiovascular: Normal rate, regular rhythm, normal heart sounds and intact distal pulses.   No murmur heard. No click appreciated  Pulmonary/Chest: Effort normal and breath sounds normal. No respiratory distress. She has no wheezes. She has no rales. She exhibits no tenderness.  Breasts normal female  Abdominal: Soft. Bowel sounds are normal. She exhibits no distension and no mass. There is no tenderness. There is no rebound and no guarding.  Genitourinary:  Deferred to GYN  Musculoskeletal: Normal range of motion. She exhibits no edema.  Lymphadenopathy:    She has no cervical adenopathy.  Neurological: She is alert and oriented to person, place, and time. She has normal reflexes.  Skin: Skin is warm and dry. No rash noted. She is not diaphoretic.  Psychiatric: She has a normal mood and affect. Her behavior is normal. Judgment and thought content normal.  Assessment & Plan:  BP running low at times will discontinue amlodipine and monitor but continue other meds  History of hypertension   Hyperlipidemia  Osteopenia- GYN says to defer bone density until next year.  History of MVP-asymptomatic     Plan: Stop amlodipine and monitor blood pressure.  Return in 6 months.

## 2013-03-18 NOTE — Patient Instructions (Addendum)
Stop amlodipine, continue to monitor blood pressure and  return in 6 months

## 2013-05-22 ENCOUNTER — Other Ambulatory Visit: Payer: Self-pay | Admitting: Internal Medicine

## 2013-08-22 ENCOUNTER — Other Ambulatory Visit: Payer: Self-pay | Admitting: Internal Medicine

## 2013-11-21 ENCOUNTER — Other Ambulatory Visit: Payer: Self-pay | Admitting: Internal Medicine

## 2014-02-20 ENCOUNTER — Other Ambulatory Visit: Payer: Self-pay | Admitting: Internal Medicine

## 2014-02-23 ENCOUNTER — Other Ambulatory Visit: Payer: Medicare HMO | Admitting: Internal Medicine

## 2014-02-23 DIAGNOSIS — Z130 Encounter for screening for diseases of the blood and blood-forming organs and certain disorders involving the immune mechanism: Secondary | ICD-10-CM

## 2014-02-23 DIAGNOSIS — E785 Hyperlipidemia, unspecified: Secondary | ICD-10-CM

## 2014-02-23 DIAGNOSIS — I10 Essential (primary) hypertension: Secondary | ICD-10-CM

## 2014-02-23 DIAGNOSIS — Z79899 Other long term (current) drug therapy: Secondary | ICD-10-CM

## 2014-02-23 DIAGNOSIS — Z1329 Encounter for screening for other suspected endocrine disorder: Secondary | ICD-10-CM

## 2014-02-23 LAB — CBC WITH DIFFERENTIAL/PLATELET
Basophils Absolute: 0 10*3/uL (ref 0.0–0.1)
Basophils Relative: 1 % (ref 0–1)
Eosinophils Absolute: 0 10*3/uL (ref 0.0–0.7)
Eosinophils Relative: 1 % (ref 0–5)
HCT: 39.9 % (ref 36.0–46.0)
Hemoglobin: 13.9 g/dL (ref 12.0–15.0)
Lymphocytes Relative: 39 % (ref 12–46)
Lymphs Abs: 1.7 10*3/uL (ref 0.7–4.0)
MCH: 30.2 pg (ref 26.0–34.0)
MCHC: 34.8 g/dL (ref 30.0–36.0)
MCV: 86.6 fL (ref 78.0–100.0)
Monocytes Absolute: 0.4 10*3/uL (ref 0.1–1.0)
Monocytes Relative: 8 % (ref 3–12)
Neutro Abs: 2.2 10*3/uL (ref 1.7–7.7)
Neutrophils Relative %: 51 % (ref 43–77)
Platelets: 260 10*3/uL (ref 150–400)
RBC: 4.61 MIL/uL (ref 3.87–5.11)
RDW: 14 % (ref 11.5–15.5)
WBC: 4.4 10*3/uL (ref 4.0–10.5)

## 2014-02-23 LAB — COMPREHENSIVE METABOLIC PANEL
ALT: 12 U/L (ref 0–35)
AST: 18 U/L (ref 0–37)
Albumin: 4.5 g/dL (ref 3.5–5.2)
Alkaline Phosphatase: 68 U/L (ref 39–117)
BUN: 14 mg/dL (ref 6–23)
CO2: 28 mEq/L (ref 19–32)
Calcium: 9.7 mg/dL (ref 8.4–10.5)
Chloride: 103 mEq/L (ref 96–112)
Creat: 0.76 mg/dL (ref 0.50–1.10)
Glucose, Bld: 92 mg/dL (ref 70–99)
Potassium: 4.1 mEq/L (ref 3.5–5.3)
Sodium: 139 mEq/L (ref 135–145)
Total Bilirubin: 0.6 mg/dL (ref 0.2–1.2)
Total Protein: 7.2 g/dL (ref 6.0–8.3)

## 2014-02-23 LAB — LIPID PANEL
Cholesterol: 179 mg/dL (ref 0–200)
HDL: 70 mg/dL (ref >39)
LDL Cholesterol: 94 mg/dL (ref 0–99)
Total CHOL/HDL Ratio: 2.6 Ratio
Triglycerides: 75 mg/dL (ref <150)
VLDL: 15 mg/dL (ref 0–40)

## 2014-02-23 LAB — TSH: TSH: 2.7 u[IU]/mL (ref 0.350–4.500)

## 2014-02-26 ENCOUNTER — Encounter: Payer: Self-pay | Admitting: Internal Medicine

## 2014-02-26 ENCOUNTER — Ambulatory Visit (INDEPENDENT_AMBULATORY_CARE_PROVIDER_SITE_OTHER): Payer: Medicare HMO | Admitting: Internal Medicine

## 2014-02-26 VITALS — BP 120/86 | HR 72 | Ht 63.0 in | Wt 121.5 lb

## 2014-02-26 DIAGNOSIS — I10 Essential (primary) hypertension: Secondary | ICD-10-CM

## 2014-02-26 DIAGNOSIS — Z0000 Encounter for general adult medical examination without abnormal findings: Secondary | ICD-10-CM

## 2014-02-26 DIAGNOSIS — E785 Hyperlipidemia, unspecified: Secondary | ICD-10-CM

## 2014-02-26 LAB — POCT URINALYSIS DIPSTICK
Bilirubin, UA: NEGATIVE
Blood, UA: NEGATIVE
Glucose, UA: NEGATIVE
Ketones, UA: NEGATIVE
Leukocytes, UA: NEGATIVE
Nitrite, UA: NEGATIVE
Protein, UA: NEGATIVE
Spec Grav, UA: <=1.005
Urobilinogen, UA: NEGATIVE
pH, UA: 7.5

## 2014-02-26 NOTE — Patient Instructions (Signed)
Try Advil and back off of exercising for a few days to see if back and abdominal pain resolved. Continue same medications for hypertension and hyperlipidemia. Return in one year or as needed.

## 2014-02-26 NOTE — Progress Notes (Signed)
Subjective:    Patient ID: Annette Tucker, female    DOB: Jul 20, 1945, 68 y.o.   MRN: [id number]  HPI 68 year old white female in today for welcome to Medicare exam. She has a history of hyperlipidemia and hypertension both are well controlled. She is on Altace 5 mg daily. Not taking amlodipine at the present time. When she did take amlodipine her blood pressure tended to run low and she did not feel well. She is on Zocor 20 mg daily and aspirin 81 mg daily. She has a history of some back pain related to a coccyx injury a number of years ago. Recently she noticed recurrence of that lower back pain and says it has been radiating around to her right abdomen. She also saw Dr. Valentino Saxon in June ,her GYN physician, because of some vaginal spotting. This was evaluated and thought not to be serious. She is now concerned about this right-sided abdominal pain. Denies being constipated or bloated. Never had this pain until her back started to hurt. Her urine is normal today. She was worried about a kidney stone. Has not tried Advil. He gets on the treadmill and walks a couple of miles several days a week. Usually walks at a brisk pace.  She has a history of osteopenia and osteoarthritis.  Was diagnosed with mitral valve prolapse in 2001 by Dr. Aldona Bar on echocardiogram. She takes vitamin D supplement. Had Zostavax vaccine September 2011.  At one point developed vaginal bleeding while using Premarin cream. She says she quit using the cream and vaginal bleeding stopped.  Tonsillectomy at age 45. TMJ mandibular advancement surgery 1986. Colonoscopy September 2006. Patient took Fosamax for a couple of years but gynecologist told her to stop in 2008.  Social history: She completed college and worked in Scientist, research (medical) for a number of years. Her job was terminated at a [employer] when the company was sold in 2009. Native of [place]. She does not smoke or consume alcohol. She is single. Mother lives  with her.  Family history: Mother has Alzheimer's disease. Father died at age 89 with kidney disease with history of MI hypertension and CVA. One brother with hyperlipidemia.      Review of Systems  Gastrointestinal:       Some radiation of right back pain into right lower abdomen  Genitourinary: Negative for dysuria and difficulty urinating.  Musculoskeletal: Positive for back pain.       Objective:   Physical Exam  Vitals reviewed. Constitutional: She is oriented to person, place, and time. She appears well-developed and well-nourished. No distress.  HENT:  Head: Normocephalic and atraumatic.  Right Ear: External ear normal.  Left Ear: External ear normal.  Mouth/Throat: Oropharynx is clear and moist. No oropharyngeal exudate.  Eyes: Conjunctivae and EOM are normal. Pupils are equal, round, and reactive to light. Right eye exhibits no discharge. Left eye exhibits no discharge. No scleral icterus.  Neck: Neck supple. No JVD present. No thyromegaly present.  Cardiovascular: Normal rate, regular rhythm, normal heart sounds and intact distal pulses.   No murmur heard. Pulmonary/Chest: Effort normal and breath sounds normal. No respiratory distress. She has no wheezes. She has no rales. She exhibits no tenderness.  Abdominal: Soft. Bowel sounds are normal.  There is no significant abdominal tenderness to palpation and no rebound tenderness. There is no distention.  Genitourinary:  Deferred to GYN  Musculoskeletal: Normal range of motion. She exhibits no edema.  Lymphadenopathy:    She has no  cervical adenopathy.  Neurological: She is alert and oriented to person, place, and time. She has normal reflexes. No cranial nerve deficit. Coordination normal.  Skin: Skin is warm and dry. No rash noted. She is not diaphoretic.  Psychiatric: She has a normal mood and affect. Her behavior is normal. Judgment and thought content normal.          Assessment & Plan:  HTN under good  control off amlodipine Hyperlipidemia  Back pain radiating to right lower quadrant-suspect this is musculoskeletal in nature  Plan: Try Advil and back off of exercising for a few days to see if symptoms resolve  Return one year or as needed.   Subjective:   Patient presents for Medicare Annual/Subsequent preventive examination.  Review Past Medical/Family/Social: see EPIC   Risk Factors  Current exercise habits: walk on treadmill 2 miles  day Dietary issues discussed: low fat, low carb  Cardiac risk factors: HTN, hyperlipidemia  Depression Screen  (Note: if answer to either of the following is "Yes", a more complete depression screening is indicated)   Over the past two weeks, have you felt down, depressed or hopeless? No  Over the past two weeks, have you felt little interest or pleasure in doing things? No Have you lost interest or pleasure in daily life? No Do you often feel hopeless? No Do you cry easily over simple problems? No   Activities of Daily Living  In your present state of health, do you have any difficulty performing the following activities?:   Driving? No  Managing money? No  Feeding yourself? No  Getting from bed to chair? No  Climbing a flight of stairs? No  Preparing food and eating?: No  Bathing or showering? No  Getting dressed: No  Getting to the toilet? No  Using the toilet:No  Moving around from place to place: No  In the past year have you fallen or had a near fall?:No  Are you sexually active? No  Do you have more than one partner? No   Hearing Difficulties: No  Do you often ask people to speak up or repeat themselves? No  Do you experience ringing or noises in your ears? No  Do you have difficulty understanding soft or whispered voices? No  Do you feel that you have a problem with memory? No Do you often misplace items? No    Home Safety:  Do you have a smoke alarm at your residence? Yes Do you have grab bars in the bathroom?  no Do you have throw rugs in your house? yes   Cognitive Testing  Alert? Yes Normal Appearance?Yes  Oriented to person? Yes Place? Yes  Time? Yes  Recall of three objects? Yes  Can perform simple calculations? Yes  Displays appropriate judgment?Yes  Can read the correct time from a watch face?Yes   List the Names of Other Physician/Practitioners you currently use:  See referral list for the physicians patient is currently seeing.  Dr. Valentino Saxon- GYN  Dr. Tonia Brooms- dermatologist Review of Systems: See above  Objective:     General appearance: Appears stated age Head: Normocephalic, without obvious abnormality, atraumatic  Eyes: conj clear, EOMi PEERLA  Ears: normal TM's and external ear canals both ears  Nose: Nares normal. Septum midline. Mucosa normal. No drainage or sinus tenderness.  Throat: lips, mucosa, and tongue normal; teeth and gums normal  Neck: no adenopathy, no carotid bruit, no JVD, supple, symmetrical, trachea midline and thyroid not enlarged, symmetric, no tenderness/mass/nodules  No CVA  tenderness.  Lungs: clear to auscultation bilaterally  Breasts: normal appearance, no masses or tenderness Heart: regular rate and rhythm, S1, S2 normal, no murmur, click, rub or gallop  Abdomen: soft, non-tender; bowel sounds normal; no masses, no organomegaly  Musculoskeletal: ROM normal in all joints, no crepitus, no deformity, Normal muscle strengthen. Back  is symmetric, no curvature. Skin: Skin color, texture, turgor normal. No rashes or lesions  Lymph nodes: Cervical, supraclavicular, and axillary nodes normal.  Neurologic: CN 2 -12 Normal, Normal symmetric reflexes. Normal coordination and gait  Psych: Alert & Oriented x 3, Mood appear stable.    Assessment:    Annual wellness medicare exam   Plan:    During the course of the visit the patient was educated and counseled about appropriate screening and preventive services including:  M[facility]ogram at [facility] has upcoming  appt. Colonoscopy due next year.      Patient Instructions (the written plan) was given to the patient.  Medicare Attestation  I have personally reviewed:  The patient's medical and social history  Their use of alcohol, tobacco or illicit drugs  Their current medications and supplements  The patient's functional ability including ADLs,fall risks, home safety risks, cognitive, and hearing and visual impairment  Diet and physical activities  Evidence for depression or mood disorders  The patient's weight, height, BMI, and visual acuity have been recorded in the chart. I have made referrals, counseling, and provided education to the patient based on review of the above and I have provided the patient with a written personalized care plan for preventive services.

## 2014-04-11 ENCOUNTER — Ambulatory Visit (INDEPENDENT_AMBULATORY_CARE_PROVIDER_SITE_OTHER): Payer: Medicare HMO | Admitting: Internal Medicine

## 2014-04-11 DIAGNOSIS — Z23 Encounter for immunization: Secondary | ICD-10-CM

## 2014-04-20 ENCOUNTER — Encounter: Payer: Self-pay | Admitting: Internal Medicine

## 2014-05-10 ENCOUNTER — Other Ambulatory Visit: Payer: Self-pay | Admitting: Obstetrics

## 2014-05-15 ENCOUNTER — Encounter (HOSPITAL_COMMUNITY)
Admission: RE | Admit: 2014-05-15 | Discharge: 2014-05-15 | Disposition: A | Discharge status: Home / Self Care | Payer: Medicare HMO | Source: Ambulatory Visit | Attending: Obstetrics | Admitting: Obstetrics

## 2014-05-15 ENCOUNTER — Encounter (HOSPITAL_COMMUNITY): Payer: Self-pay

## 2014-05-15 DIAGNOSIS — R011 Cardiac murmur, unspecified: Secondary | ICD-10-CM | POA: Diagnosis not present

## 2014-05-15 DIAGNOSIS — N959 Unspecified menopausal and perimenopausal disorder: Secondary | ICD-10-CM | POA: Diagnosis not present

## 2014-05-15 DIAGNOSIS — N840 Polyp of corpus uteri: Secondary | ICD-10-CM | POA: Diagnosis not present

## 2014-05-15 DIAGNOSIS — I10 Essential (primary) hypertension: Secondary | ICD-10-CM | POA: Diagnosis not present

## 2014-05-15 DIAGNOSIS — E785 Hyperlipidemia, unspecified: Secondary | ICD-10-CM | POA: Diagnosis not present

## 2014-05-15 DIAGNOSIS — M549 Dorsalgia, unspecified: Secondary | ICD-10-CM | POA: Diagnosis not present

## 2014-05-15 HISTORY — DX: Other seasonal allergic rhinitis: J30.2

## 2014-05-15 LAB — BASIC METABOLIC PANEL
Anion gap: 9 (ref 5–15)
BUN: 15 mg/dL (ref 6–23)
CO2: 28 mEq/L (ref 19–32)
Calcium: 9.6 mg/dL (ref 8.4–10.5)
Chloride: 100 mEq/L (ref 96–112)
Creatinine, Ser: 0.69 mg/dL (ref 0.50–1.10)
GFR calc Af Amer: >90 mL/min (ref >90)
GFR calc non Af Amer: 89 mL/min — ABNORMAL LOW (ref >90)
Glucose, Bld: 91 mg/dL (ref 70–99)
Potassium: 5 mEq/L (ref 3.7–5.3)
Sodium: 137 mEq/L (ref 137–147)

## 2014-05-15 LAB — CBC
HCT: 40.5 % (ref 36.0–46.0)
Hemoglobin: 14.1 g/dL (ref 12.0–15.0)
MCH: 31.7 pg (ref 26.0–34.0)
MCHC: 34.8 g/dL (ref 30.0–36.0)
MCV: 91 fL (ref 78.0–100.0)
Platelets: 232 10*3/uL (ref 150–400)
RBC: 4.45 MIL/uL (ref 3.87–5.11)
RDW: 13 % (ref 11.5–15.5)
WBC: 5.9 10*3/uL (ref 4.0–10.5)

## 2014-05-15 NOTE — Patient Instructions (Addendum)
   Your procedure is scheduled on: Thursday, Nov 19  Enter through the [employer] [facility] at: 11:45 AM Pick up the phone at the desk and dial [phone] and inform us of your arrival.  Please call this number if you have any problems the morning of surgery: [phone]  Remember: Do not eat food after midnight: Wednesday Do not drink clear liquids after: 9 AM Thursday, day of surgery Take these medicines the morning of surgery with a SIP OF WATER: ramipril   Do not wear jewelry, make-up, or FINGER nail polish No metal in your hair or on your body. Do not wear lotions, powders, perfumes.  You may wear deodorant.  Do not bring valuables to the hospital. Contacts, dentures or bridgework may not be worn into surgery.  Patients discharged on the day of surgery will not be allowed to drive home.  Home with brother Jenny Reichmann cell [phone]

## 2014-05-17 ENCOUNTER — Ambulatory Visit (HOSPITAL_COMMUNITY): Payer: Medicare HMO | Admitting: Anesthesiology

## 2014-05-17 ENCOUNTER — Encounter (HOSPITAL_COMMUNITY): Payer: Self-pay | Admitting: Anesthesiology

## 2014-05-17 ENCOUNTER — Encounter (HOSPITAL_COMMUNITY)
Admission: RE | Disposition: A | Discharge status: Home / Self Care | Payer: Self-pay | Source: Ambulatory Visit | Attending: Obstetrics

## 2014-05-17 ENCOUNTER — Ambulatory Visit (HOSPITAL_COMMUNITY)
Admission: RE | Admit: 2014-05-17 | Discharge: 2014-05-17 | Disposition: A | Discharge status: Home / Self Care | Payer: Medicare HMO | Source: Ambulatory Visit | Attending: Obstetrics | Admitting: Obstetrics

## 2014-05-17 DIAGNOSIS — E785 Hyperlipidemia, unspecified: Secondary | ICD-10-CM | POA: Diagnosis not present

## 2014-05-17 DIAGNOSIS — N840 Polyp of corpus uteri: Secondary | ICD-10-CM | POA: Insufficient documentation

## 2014-05-17 DIAGNOSIS — I10 Essential (primary) hypertension: Secondary | ICD-10-CM | POA: Insufficient documentation

## 2014-05-17 DIAGNOSIS — M549 Dorsalgia, unspecified: Secondary | ICD-10-CM | POA: Insufficient documentation

## 2014-05-17 DIAGNOSIS — R011 Cardiac murmur, unspecified: Secondary | ICD-10-CM | POA: Insufficient documentation

## 2014-05-17 DIAGNOSIS — N959 Unspecified menopausal and perimenopausal disorder: Secondary | ICD-10-CM | POA: Insufficient documentation

## 2014-05-17 HISTORY — PX: DILATATION & CURETTAGE/HYSTEROSCOPY WITH TRUECLEAR: SHX6353

## 2014-05-17 SURGERY — DILATATION & CURETTAGE/HYSTEROSCOPY WITH TRUCLEAR
Anesthesia: General

## 2014-05-17 MED ORDER — LIDOCAINE HCL (CARDIAC) 20 MG/ML IV SOLN
INTRAVENOUS | Status: DC | PRN
  Administered 2014-05-17: 50 mg via INTRAVENOUS

## 2014-05-17 MED ORDER — ONDANSETRON HCL 4 MG/2ML IJ SOLN
INTRAMUSCULAR | Status: AC
  Filled 2014-05-17: qty 2

## 2014-05-17 MED ORDER — FENTANYL CITRATE 0.05 MG/ML IJ SOLN
INTRAMUSCULAR | Status: AC
  Filled 2014-05-17: qty 2

## 2014-05-17 MED ORDER — METOCLOPRAMIDE HCL 5 MG/ML IJ SOLN: 10.0000 mg | Freq: Once | INTRAMUSCULAR | Status: DC | PRN

## 2014-05-17 MED ORDER — KETOROLAC TROMETHAMINE 30 MG/ML IJ SOLN
30.0000 mg | Freq: Once | INTRAMUSCULAR | Status: AC
  Administered 2014-05-17: 30 mg via INTRAVENOUS

## 2014-05-17 MED ORDER — BUPIVACAINE HCL (PF) 0.5 % IJ SOLN
INTRAMUSCULAR | Status: AC
  Filled 2014-05-17: qty 30

## 2014-05-17 MED ORDER — IBUPROFEN 600 MG PO TABS: 600.0000 mg | ORAL_TABLET | Freq: Four times a day (QID) | ORAL | Status: DC | PRN

## 2014-05-17 MED ORDER — SODIUM CHLORIDE 0.9 % IR SOLN
Status: DC | PRN
  Administered 2014-05-17: 3000 mL

## 2014-05-17 MED ORDER — PROPOFOL 10 MG/ML IV EMUL
INTRAVENOUS | Status: AC
  Filled 2014-05-17: qty 20

## 2014-05-17 MED ORDER — DEXAMETHASONE SODIUM PHOSPHATE 10 MG/ML IJ SOLN
INTRAMUSCULAR | Status: DC | PRN
  Administered 2014-05-17: 4 mg via INTRAVENOUS

## 2014-05-17 MED ORDER — MIDAZOLAM HCL 2 MG/2ML IJ SOLN
INTRAMUSCULAR | Status: AC
  Filled 2014-05-17: qty 2

## 2014-05-17 MED ORDER — SCOPOLAMINE 1 MG/3DAYS TD PT72: 1.0000 | MEDICATED_PATCH | Freq: Once | TRANSDERMAL | Status: DC

## 2014-05-17 MED ORDER — KETOROLAC TROMETHAMINE 30 MG/ML IJ SOLN
INTRAMUSCULAR | Status: AC
  Administered 2014-05-17: 30 mg via INTRAVENOUS
  Filled 2014-05-17: qty 1

## 2014-05-17 MED ORDER — EPHEDRINE 5 MG/ML INJ
INTRAVENOUS | Status: AC
  Filled 2014-05-17: qty 10

## 2014-05-17 MED ORDER — CHLOROPROCAINE HCL 1 % IJ SOLN
INTRAMUSCULAR | Status: AC
  Filled 2014-05-17: qty 30

## 2014-05-17 MED ORDER — ONDANSETRON HCL 4 MG/2ML IJ SOLN
INTRAMUSCULAR | Status: DC | PRN
  Administered 2014-05-17: 4 mg via INTRAVENOUS

## 2014-05-17 MED ORDER — EPHEDRINE SULFATE 50 MG/ML IJ SOLN
INTRAMUSCULAR | Status: DC | PRN
  Administered 2014-05-17 (×2): 10 mg via INTRAVENOUS
  Administered 2014-05-17: 5 mg via INTRAVENOUS
  Administered 2014-05-17: 10 mg via INTRAVENOUS

## 2014-05-17 MED ORDER — VASOPRESSIN 20 UNIT/ML IV SOLN
INTRAVENOUS | Status: DC | PRN
  Administered 2014-05-17: .3 [IU]

## 2014-05-17 MED ORDER — DEXAMETHASONE SODIUM PHOSPHATE 4 MG/ML IJ SOLN
INTRAMUSCULAR | Status: AC
  Filled 2014-05-17: qty 1

## 2014-05-17 MED ORDER — OXYCODONE-ACETAMINOPHEN 5-325 MG PO TABS: 1.0000 | ORAL_TABLET | ORAL | Status: DC | PRN

## 2014-05-17 MED ORDER — MEPERIDINE HCL 25 MG/ML IJ SOLN: 6.2500 mg | INTRAMUSCULAR | Status: DC | PRN

## 2014-05-17 MED ORDER — VASOPRESSIN 20 UNIT/ML IV SOLN
INTRAVENOUS | Status: AC
  Filled 2014-05-17: qty 1

## 2014-05-17 MED ORDER — LACTATED RINGERS IV SOLN
INTRAVENOUS | Status: DC
  Administered 2014-05-17: 13:00:00 via INTRAVENOUS

## 2014-05-17 MED ORDER — PROPOFOL 10 MG/ML IV BOLUS
INTRAVENOUS | Status: DC | PRN
  Administered 2014-05-17: 150 mg via INTRAVENOUS

## 2014-05-17 MED ORDER — BUPIVACAINE HCL 0.5 % IJ SOLN
INTRAMUSCULAR | Status: DC | PRN
  Administered 2014-05-17: 10 mL

## 2014-05-17 MED ORDER — FENTANYL CITRATE 0.05 MG/ML IJ SOLN
INTRAMUSCULAR | Status: DC | PRN
  Administered 2014-05-17 (×3): 50 ug via INTRAVENOUS

## 2014-05-17 MED ORDER — FENTANYL CITRATE 0.05 MG/ML IJ SOLN: 25.0000 ug | INTRAMUSCULAR | Status: DC | PRN

## 2014-05-17 MED ORDER — MIDAZOLAM HCL 2 MG/2ML IJ SOLN
INTRAMUSCULAR | Status: DC | PRN
  Administered 2014-05-17: 1 mg via INTRAVENOUS

## 2014-05-17 MED ORDER — SILVER NITRATE-POT NITRATE 75-25 % EX MISC
CUTANEOUS | Status: AC
  Filled 2014-05-17: qty 1

## 2014-05-17 MED ORDER — SCOPOLAMINE 1 MG/3DAYS TD PT72
MEDICATED_PATCH | TRANSDERMAL | Status: AC
  Filled 2014-05-17: qty 1

## 2014-05-17 MED ORDER — LIDOCAINE HCL (CARDIAC) 20 MG/ML IV SOLN
INTRAVENOUS | Status: AC
  Filled 2014-05-17: qty 5

## 2014-05-17 SURGICAL SUPPLY — 21 items
BLADE INCISOR TRUC PLUS 2.9 (ABLATOR) IMPLANT
CANISTERS HI-FLOW 3000CC (CANNISTER) ×4 IMPLANT
CATH FOLEY LF 3WAY 5CC16FR (CATHETERS) ×1 IMPLANT
CATH ROBINSON RED A/P 16FR (CATHETERS) ×1 IMPLANT
CLOTH BEACON ORANGE TIMEOUT ST (SAFETY) ×2 IMPLANT
CONTAINER PREFILL 10% NBF 60ML (FORM) ×4 IMPLANT
GLOVE BIO SURGEON STRL SZ 6.5 (GLOVE) ×4 IMPLANT
GLOVE BIOGEL PI IND STRL 7.0 (GLOVE) ×1 IMPLANT
GLOVE BIOGEL PI INDICATOR 7.0 (GLOVE) ×1
GOWN STRL REUS W/TWL LRG LVL3 (GOWN DISPOSABLE) ×4 IMPLANT
INCISOR TRUC PLUS BLADE 2.9 (ABLATOR) ×2
KIT HYSTEROSCOPY TRUCLEAR (ABLATOR) IMPLANT
MORCELLATOR RECIP TRUCLEAR 4.0 (ABLATOR) IMPLANT
NDL HYPO 25X1 1.5 SAFETY (NEEDLE) IMPLANT
NEEDLE HYPO 25X1 1.5 SAFETY (NEEDLE) ×2 IMPLANT
PACK VAGINAL MINOR WOMEN LF (CUSTOM PROCEDURE TRAY) ×2 IMPLANT
PAD OB MATERNITY 4.3X12.25 (PERSONAL CARE ITEMS) ×2 IMPLANT
STENT BALLN UTERINE 4CM 6FR (STENTS) IMPLANT
SYR TB 1ML LUER SLIP (SYRINGE) ×1 IMPLANT
TOWEL OR 17X24 6PK STRL BLUE (TOWEL DISPOSABLE) ×4 IMPLANT
WATER STERILE IRR 1000ML POUR (IV SOLUTION) ×2 IMPLANT

## 2014-05-17 NOTE — Anesthesia Preprocedure Evaluation (Signed)
Anesthesia Evaluation  Patient identified by MRN, date of birth, ID band Patient awake    Reviewed: Allergy & Precautions, H&P , NPO status , Patient's Chart, lab work & pertinent test results  Airway Mallampati: II  TM Distance: >3 FB Neck ROM: Full    Dental no notable dental hx. (+) Teeth Intact   Pulmonary neg pulmonary ROS,  breath sounds clear to auscultation  Pulmonary exam normal       Cardiovascular hypertension, Pt. on medications + Valvular Problems/Murmurs Rhythm:Regular Rate:Normal     Neuro/Psych negative neurological ROS  negative psych ROS   GI/Hepatic negative GI ROS, Neg liver ROS,   Endo/Other  Hyperlipidemia  Renal/GU negative Renal ROS  negative genitourinary   Musculoskeletal  (+) Arthritis -, Osteoarthritis,  Osteopenia   Abdominal   Peds  Hematology negative hematology ROS (+)   Anesthesia Other Findings   Reproductive/Obstetrics Endometrial polyp                             Anesthesia Physical Anesthesia Plan  ASA: II  Anesthesia Plan: General   Post-op Pain Management:    Induction: Intravenous  Airway Management Planned: LMA  Additional Equipment:   Intra-op Plan:   Post-operative Plan: Extubation in OR  Informed Consent: I have reviewed the patients History and Physical, chart, labs and discussed the procedure including the risks, benefits and alternatives for the proposed anesthesia with the patient or authorized representative who has indicated his/her understanding and acceptance.   Dental advisory given  Plan Discussed with: Anesthesiologist, CRNA and Surgeon  Anesthesia Plan Comments:         Anesthesia Quick Evaluation

## 2014-05-17 NOTE — H&P (Signed)
CC: hysteroscopy/ polypectomy  HPI: 68 yo post-menopausal pt with h/o PMB, cramping and back pain. Eval reveals small uterus, <50cc, 5cm in lengthy and 2 small endometrial polyps with surrounding endometrial fluid.  Past Medical History  Diagnosis Date  . Hyperlipidemia   . Hypertension   . Heart murmur     as child, no problems   . Seasonal allergies     Past Surgical History  Procedure Laterality Date  . Tonsillectomy      age 68  . Tmj mandibular advancement    . Wisdom tooth extraction    . Breast surgery      left breast bx - benign  . Colonoscopy  2006   Latex allergy  PE: Filed Vitals:   05/17/14 1153  BP: 144/89  Pulse: 89  Temp: 97.9 F (36.6 C)  Resp: 16   Gen: well appearing Abd: soft, NT, ND GU: def to OR LE: NT, no edema  CBC    Component Value Date/Time   WBC 5.9 05/15/2014 1145   RBC 4.45 05/15/2014 1145   HGB 14.1 05/15/2014 1145   HCT 40.5 05/15/2014 1145   PLT 232 05/15/2014 1145   MCV 91.0 05/15/2014 1145   MCH 31.7 05/15/2014 1145   MCHC 34.8 05/15/2014 1145   RDW 13.0 05/15/2014 1145   LYMPHSABS 1.7 02/23/2014 0905   MONOABS 0.4 02/23/2014 0905   EOSABS 0.0 02/23/2014 0905   BASOSABS 0.0 02/23/2014 0905     A/P: PMB w/ endometrial pollyp. For resection. R/B d/w pt.  Rashid Whitenight A. 05/17/2014 1:22 PM

## 2014-05-17 NOTE — Discharge Instructions (Signed)
No Ibuprofen containing products (ie Advil, Aleve, Motrin, etc.) until after 7:00 pm tonight.  DISCHARGE INSTRUCTIONS: HYSTEROSCOPY / ENDOMETRIAL ABLATION The following instructions have been prepared to help you care for yourself upon your return home.  Personal hygiene:  Use sanitary pads for vaginal drainage, not tampons.  Shower the day after your procedure.  NO tub baths, pools or Jacuzzis for 2-3 weeks.  Wipe front to back after using the bathroom.  Activity and limitations:  Do NOT drive or operate any equipment for 24 hours. The effects of anesthesia are still present and drowsiness may result.  Do NOT rest in bed all day.  Walking is encouraged.  Walk up and down stairs slowly.  You may resume your normal activity in one to two days or as indicated by your physician. Sexual activity: NO intercourse for at least 2 weeks after the procedure, or as indicated by your Doctor.  Diet: Eat a light meal as desired this evening. You may resume your usual diet tomorrow.  Return to Work: You may resume your work activities in one to two days or as indicated by Marine scientist.  What to expect after your surgery: Expect to have vaginal bleeding/discharge for 2-3 days and spotting for up to 10 days. It is not unusual to have soreness for up to 1-2 weeks. You may have a slight burning sensation when you urinate for the first day. Mild cramps may continue for a couple of days. You may have a regular period in 2-6 weeks.  Call your doctor for any of the following:  Excessive vaginal bleeding or clotting, saturating and changing one pad every hour.  Inability to urinate 6 hours after discharge from hospital.  Pain not relieved by pain medication.  Fever of 100.4 F or greater.  Unusual vaginal discharge or odor.  Return to office _________________Call for an appointment ___________________ Patients signature: ______________________ Nurses signature  ________________________  [facility] [phone]

## 2014-05-17 NOTE — Op Note (Signed)
05/17/2014  2:55 PM  PATIENT:  Annette Tucker  68 y.o. female  PRE-OPERATIVE DIAGNOSIS:  Endometrial Polyps  POST-OPERATIVE DIAGNOSIS:  Endometrial Polyps  PROCEDURE:  Procedure(s): HYSTEROSCOPY WITH POLYPECTOMY (N/A)  SURGEON:  Surgeon(s) and Role:    * Lance Huaracha A. Pamala Hurry, MD - Primary  PHYSICIAN ASSISTANT:   ASSISTANTS: none   ANESTHESIA:   local and general  EBL:  Total I/O In: 1300 [I.V.:1300] Out: 160 [Urine:150; Blood:10]  BLOOD ADMINISTERED:none  DRAINS: none   LOCAL MEDICATIONS USED:  OTHER 0.3 mL of vasopressin constituting 6 units of vasopressin mixed in 30 mL of half percent Marcaine. 10 mL of this solution was used.  SPECIMEN:  Source of Specimen:  Endometrial polyps  DISPOSITION OF SPECIMEN:  PATHOLOGY  COUNTS:  YES  TOURNIQUET:  * No tourniquets in log *  DICTATION: .Note written in EPIC  PLAN OF CARE: Discharge to home after PACU  PATIENT DISPOSITION:  PACU - hemodynamically stable.   Delay start of Pharmacological VTE agent (>24hrs) due to surgical blood loss or risk of bleeding: yes  Findings: Small atrophic endometrium, atrophic vaginitis, small uterus, no pelvic masses, 2 small polyps in the uterus and one additional polyp in the cervical canal, visualization of bilateral ostia, hemostasis post-procedure  Indications: Endometrial polyps, postmenopausal vaginal spotting, back pain   After informed consent including discussion of risks of bleeding, infection, perforation (higher risk given her postmenopausal atrophy),  the patient was taken to the operating room where general anesthesia was initiated without difficulty. She was prepped and draped in normal sterile fashion in the dorsal supine lithotomy position.  A bimanual examination was done to assess the size and position of the uterus. A small speculum was placed in the vagina and single tooth tenaculum used to grasp the anterior lip of the cervix. Local anaesthetic with vasopressin was injected  at 5 and 7 o'clock in there cervico-paracervical junction.   The cervix was then serially dilated to a #19 Pratt dilator. This was done with great care given her small atrophic uterus. Uterine sound to 5-1/2 cm was done .the hysteroscope was inserted. Survey of the endometrium/ pathology with findings as above. A small but elongated polyp was noted in the cervix and this was removed first with the true clear device. Additional dilation was done with care and the true clear was eventually passed into the uterine cavity. 2 additional polyps were noted. The remainder of the endometrium appeared atrophic. Bilateral ostia were visualized. The TruClear blade was then placed through the operating channel, suction was applied and the polyp  was serially grasped with the blade and morcellated. This continued until all polyps were removed. Hemostasis was noted.  The hysteroscope was then removed. Tenaculum was removed. The tenaculum site was hemostatic and the case was terminated. The patient tolerated the procedure well. Sponge, lap and needle counts were correct and the patient was taken to the recovery room in stable condition.   Hysteroscopic deficit was estimated at 800 mL though this is likely an overestimate given the large amount of hysteroscopic fluid that had leaked on the floor.  Newell Frater A. 05/17/2014 2:57 PM

## 2014-05-17 NOTE — Transfer of Care (Signed)
Immediate Anesthesia Transfer of Care Note  Patient: Annette Tucker  Procedure(s) Performed: Procedure(s): HYSTEROSCOPY WITH POLYPECTOMY (N/A)  Patient Location: PACU  Anesthesia Type:General  Level of Consciousness: awake, alert  and oriented  Airway & Oxygen Therapy: Patient Spontanous Breathing and Patient connected to nasal cannula oxygen  Post-op Assessment: Report given to PACU RN and Post -op Vital signs reviewed and stable  Post vital signs: Reviewed and stable  Complications: No apparent anesthesia complications

## 2014-05-17 NOTE — Brief Op Note (Signed)
05/17/2014  2:55 PM  PATIENT:  Annette Tucker  68 y.o. female  PRE-OPERATIVE DIAGNOSIS:  Endometrial Polyps  POST-OPERATIVE DIAGNOSIS:  Endometrial Polyps  PROCEDURE:  Procedure(s): HYSTEROSCOPY WITH POLYPECTOMY (N/A)  SURGEON:  Surgeon(s) and Role:    * Mykael Trott A. Pamala Hurry, MD - Primary  PHYSICIAN ASSISTANT:   ASSISTANTS: none   ANESTHESIA:   local and general  EBL:  Total I/O In: 1300 [I.V.:1300] Out: 160 [Urine:150; Blood:10]  BLOOD ADMINISTERED:none  DRAINS: none   LOCAL MEDICATIONS USED:  OTHER 0.3 mL of vasopressin constituting 6 units of vasopressin mixed in 30 mL of half percent Marcaine. 10 mL of this solution was used.  SPECIMEN:  Source of Specimen:  Endometrial polyps  DISPOSITION OF SPECIMEN:  PATHOLOGY  COUNTS:  YES  TOURNIQUET:  * No tourniquets in log *  DICTATION: .Note written in EPIC  PLAN OF CARE: Discharge to home after PACU  PATIENT DISPOSITION:  PACU - hemodynamically stable.   Delay start of Pharmacological VTE agent (>24hrs) due to surgical blood loss or risk of bleeding: yes

## 2014-05-17 NOTE — Anesthesia Postprocedure Evaluation (Signed)
Anesthesia Post Note  Patient: Annette Tucker  Procedure(s) Performed: Procedure(s) (LRB): HYSTEROSCOPY WITH POLYPECTOMY (N/A)  Anesthesia type: General  Patient location: PACU  Post pain: Pain level controlled  Post assessment: Post-op Vital signs reviewed  Last Vitals:  Filed Vitals:   05/17/14 1500  BP: 136/62  Pulse: 90  Temp:   Resp: 16    Post vital signs: Reviewed  Level of consciousness: sedated  Complications: No apparent anesthesia complications

## 2014-05-18 ENCOUNTER — Encounter (HOSPITAL_COMMUNITY): Payer: Self-pay | Admitting: Obstetrics

## 2014-05-22 ENCOUNTER — Other Ambulatory Visit: Payer: Self-pay | Admitting: Internal Medicine

## 2014-05-30 ENCOUNTER — Encounter: Payer: Self-pay | Admitting: Gynecologic Oncology

## 2014-05-30 ENCOUNTER — Ambulatory Visit: Payer: Medicare HMO | Attending: Gynecologic Oncology | Admitting: Gynecologic Oncology

## 2014-05-30 ENCOUNTER — Ambulatory Visit: Payer: Medicare HMO

## 2014-05-30 VITALS — BP 164/90 | HR 85 | Temp 98.2°F | Resp 18 | Ht 63.0 in | Wt 124.0 lb

## 2014-05-30 DIAGNOSIS — C541 Malignant neoplasm of endometrium: Secondary | ICD-10-CM | POA: Diagnosis not present

## 2014-05-30 DIAGNOSIS — Z809 Family history of malignant neoplasm, unspecified: Secondary | ICD-10-CM | POA: Diagnosis not present

## 2014-05-30 DIAGNOSIS — M545 Low back pain: Secondary | ICD-10-CM | POA: Diagnosis not present

## 2014-05-30 DIAGNOSIS — C55 Malignant neoplasm of uterus, part unspecified: Secondary | ICD-10-CM | POA: Insufficient documentation

## 2014-05-30 DIAGNOSIS — E785 Hyperlipidemia, unspecified: Secondary | ICD-10-CM | POA: Insufficient documentation

## 2014-05-30 DIAGNOSIS — Z803 Family history of malignant neoplasm of breast: Secondary | ICD-10-CM | POA: Diagnosis not present

## 2014-05-30 DIAGNOSIS — I10 Essential (primary) hypertension: Secondary | ICD-10-CM | POA: Diagnosis not present

## 2014-05-30 DIAGNOSIS — R011 Cardiac murmur, unspecified: Secondary | ICD-10-CM | POA: Diagnosis not present

## 2014-05-30 DIAGNOSIS — Z79899 Other long term (current) drug therapy: Secondary | ICD-10-CM | POA: Insufficient documentation

## 2014-05-30 LAB — COMPREHENSIVE METABOLIC PANEL (CC13)
ALT: 15 U/L (ref 0–55)
AST: 20 U/L (ref 5–34)
Albumin: 4.5 g/dL (ref 3.5–5.0)
Alkaline Phosphatase: 82 U/L (ref 40–150)
Anion Gap: 11 mEq/L (ref 3–11)
BUN: 15.1 mg/dL (ref 7.0–26.0)
CO2: 26 mEq/L (ref 22–29)
Calcium: 10.3 mg/dL (ref 8.4–10.4)
Chloride: 103 mEq/L (ref 98–109)
Creatinine: 0.8 mg/dL (ref 0.6–1.1)
Glucose: 104 mg/dl (ref 70–140)
Potassium: 4.3 mEq/L (ref 3.5–5.1)
Sodium: 139 mEq/L (ref 136–145)
Total Bilirubin: 0.29 mg/dL (ref 0.20–1.20)
Total Protein: 8 g/dL (ref 6.4–8.3)

## 2014-05-30 LAB — CBC WITH DIFFERENTIAL/PLATELET
BASO%: 0.4 % (ref 0.0–2.0)
Basophils Absolute: 0 10*3/uL (ref 0.0–0.1)
EOS%: 0.6 % (ref 0.0–7.0)
Eosinophils Absolute: 0.1 10*3/uL (ref 0.0–0.5)
HCT: 42.6 % (ref 34.8–46.6)
HGB: 14.6 g/dL (ref 11.6–15.9)
LYMPH%: 18.8 % (ref 14.0–49.7)
MCH: 30.5 pg (ref 25.1–34.0)
MCHC: 34.3 g/dL (ref 31.5–36.0)
MCV: 89.1 fL (ref 79.5–101.0)
MONO#: 0.6 10*3/uL (ref 0.1–0.9)
MONO%: 7.3 % (ref 0.0–14.0)
NEUT#: 5.8 10*3/uL (ref 1.5–6.5)
NEUT%: 72.9 % (ref 38.4–76.8)
Platelets: 216 10*3/uL (ref 145–400)
RBC: 4.78 10*6/uL (ref 3.70–5.45)
RDW: 13 % (ref 11.2–14.5)
WBC: 8 10*3/uL (ref 3.9–10.3)
lymph#: 1.5 10*3/uL (ref 0.9–3.3)
nRBC: 0 % (ref 0–0)

## 2014-05-30 NOTE — Patient Instructions (Addendum)
Preparing for your Surgery  Plan for surgery on December 8 with [name].  Pre-operative Testing -You will receive a phone call from presurgical testing at [facility] to arrange for a pre-operative testing appointment before your surgery.  This appointment normally occurs one to two weeks before your scheduled surgery.   -Bring your insurance card, copy of an advanced directive if applicable, medication list  -At that visit, you will be asked to sign a consent for a possible blood transfusion in case a transfusion becomes necessary during surgery.  The need for a blood transfusion is rare but having consent is a necessary part of your care.     -You should not be taking blood thinners or aspirin at least ten days prior to surgery unless instructed by your surgeon.  Day Before Surgery at [place] will be asked to take in only clear liquids the day before surgery.  Examples of clear liquids include broths, jello, and clear juices.  You may also be advised to perform a Miralax bowel prep or fleets enema the night before your surgery based off of your provider's recommendations.  You will be advised to have nothing to eat or drink after midnight the evening before.    Your role in recovery Your role is to become active as soon as directed by your doctor, while still giving yourself time to heal.  Rest when you feel tired. You will be asked to do the following in order to speed your recovery:  - Cough and breathe deeply. This helps toclear and expand your lungs and can prevent pneumonia. You may be given a spirometer to practice deep breathing. A staff member will show you how to use the spirometer. - Do mild physical activity. Walking or moving your legs help your circulation and body functions return to normal. A staff member will help you when you try to walk and will provide you with simple exercises. Do not try to get up or walk alone the first time. - Actively manage  your pain. Managing your pain lets you move in comfort. We will ask you to rate your pain on a scale of zero to 10. It is your responsibility to tell your doctor or nurse where and how much you hurt so your pain can be treated.  Special Considerations -If you are diabetic, you may be placed on insulin after surgery to have closer control over your blood sugars to promote healing and recovery.  This does not mean that you will be discharged on insulin.  If applicable, your oral antidiabetics will be resumed when you are tolerating a solid diet.  -Your final pathology results from surgery should be available by the Friday after surgery and the results will be relayed to you when available.  Blood Transfusion Information WHAT IS A BLOOD TRANSFUSION? A transfusion is the replacement of blood or some of its parts. Blood is made up of multiple cells which provide different functions.  Red blood cells carry oxygen and are used for blood loss replacement.  White blood cells fight against infection.  Platelets control bleeding.  Plasma helps clot blood.  Other blood products are available for specialized needs, such as hemophilia or other clotting disorders. BEFORE THE TRANSFUSION  Who gives blood for transfusions?   You may be able to donate blood to be used at a later date on yourself (autologous donation).  Relatives can be asked to donate blood. This is generally not any safer than if you  have received blood from a stranger. The same precautions are taken to ensure safety when a relative's blood is donated.  Healthy volunteers who are fully evaluated to make sure their blood is safe. This is blood bank blood. Transfusion therapy is the safest it has ever been in the practice of medicine. Before blood is taken from a donor, a complete history is taken to make sure that person has no history of diseases nor engages in risky social behavior (examples are intravenous drug use or sexual activity  with multiple partners). The donor's travel history is screened to minimize risk of transmitting infections, such as malaria. The donated blood is tested for signs of infectious diseases, such as HIV and hepatitis. The blood is then tested to be sure it is compatible with you in order to minimize the chance of a transfusion reaction. If you or a relative donates blood, this is often done in anticipation of surgery and is not appropriate for emergency situations. It takes many days to process the donated blood. RISKS AND COMPLICATIONS Although transfusion therapy is very safe and saves many lives, the main dangers of transfusion include:   Getting an infectious disease.  Developing a transfusion reaction. This is an allergic reaction to something in the blood you were given. Every precaution is taken to prevent this. The decision to have a blood transfusion has been considered carefully by your caregiver before blood is given. Blood is not given unless the benefits outweigh the risks.     Uterine Cancer Uterine cancer is an abnormal growth of tissue (tumor) in the uterus that is cancerous (malignant). Unlike noncancerous (benign) tumors, malignant tumors can spread to other parts of your body. The wall of the uterus has two layers of tissue. The inner layer is the endometrium. The outer layer of muscle tissue is the myometrium. The most common type of uterine cancer begins in the endometrium. This is called endometrial cancer. Cancer that begins in the myometrium is called uterine sarcoma, which is very rare.  RISK FACTORS  Although the exact cause of uterine cancer is unknown, there are a number of risk factors that can increase your chances of getting uterine cancer. They include:  Your age. Uterine cancer occurs mostly in women older than 50 years.   Having an enlarged endometrium (endometrial hyperplasia).   Using hormone therapy.   Obesity.   Taking the drug tamoxifen.   White  race.   Infertility.   Never being pregnant.   Beginning menstrual periods at an age younger than 12 years.   Having menstrual periods at an age older than 45 years.   Personal history of ovarian, intestinal, or colorectal cancer.   Having a family history of uterine cancer.   Having a family history of hereditary nonpolyposis colon cancer (HNPCC).   Having diabetes, high blood pressure, thyroid disease, or gallbladder disease.   Long-term use of high-dose birth control pills.   Exposure to radiation.   Smoking.  SIGNS AND SYMPTOMS   Abnormal vaginal bleeding or discharge. Bleeding may start as a watery, blood-streaked flow that gradually contains more blood.   Any vaginal bleeding after menopause.   Difficult or painful urination.   Pain during intercourse.   Pain in the pelvic area.  Mass in the vagina.  Pain or fullness in the abdomen.  Frequent urination.  Bleeding between periods.  Growth of the stomach.   Unexplained weight loss.  Uterine cancer usually occurs after menopause. However, it may also occur around  the time that menopause begins. Abnormal vaginal bleeding is the most common symptom of uterine cancer. Women should not assume that abnormal vaginal bleeding is part of menopause. DIAGNOSIS  Your health care provider will ask about your medical history. He or she may also perform a number of procedures, such as:  A physical and pelvic exam. Your health care provider will feel your pelvis for any lumps.   Blood and urine tests.   X-rays.   Imaging tests, such as CT scans, ultrasonography, or MRIs.   A hysteroscopy to view the inside of your uterus.   A Pap test to sample cells from the cervix and upper vagina to check for abnormal cells.   Taking a tissue sample (biopsy) from the uterine lining to look for cancer cells.   A dilation and curettage (D&C). This involves stretching (dilation) the cervix and scraping  (curettage) the inside lining of the uterus to get a tissue sample. The sample is examined under a microscope to look for cancer cells.  Your cancer will be staged to determine its severity and extent. Staging is a careful attempt to find out the size of the tumor, whether the cancer has spread, and if so, to what parts of the body. You may need to have more tests to determine the stage of your cancer. The test results will help determine what treatment plan is best for you. Cancer stages include:   Stage I. The cancer is only found in the uterus.  Stage II. The cancer has spread to the cervix.  Stage III. The cancer has spread outside the uterus, but not outside the pelvis. The cancer may have spread to the lymph nodes in the pelvis.  Stage IV. The cancer has spread to other parts of the body, such as the bladder or rectum. TREATMENT  Most women with uterine cancer are treated with surgery. This includes removing the uterus, cervix, fallopian tubes, and ovaries (total hysterectomy). Your lymph nodes near the tumor may also be removed. Some women have radiation, chemotherapy, or hormonal therapy. Other women have a combination of these therapies. HOME CARE INSTRUCTIONS   Take medicines only as directed by your health care provider.   Maintain a healthy diet.  Exercise regularly.   If you have diabetes, high blood pressure, thyroid disease, or gallbladder disease, follow your health care provider's instructions to keep it under control.   Do not smoke.   Consider joining a support group. This may help you learn to cope with the stress of having uterine cancer.   Seek advice to help you manage treatment side effects.   Keep all follow-up visits as directed by your health care provider.  SEEK MEDICAL CARE IF:  You have increased stomach or pelvic pain.  You cannot urinate.  You have abnormal bleeding. Document Released: 06/15/2005 Document Revised: 10/30/2013 Document  Reviewed: 12/02/2012 [employer] Patient Information 2015 [place], [place]. This information is not intended to replace advice given to you by your health care provider. Make sure you discuss any questions you have with your health care provider.   Your surgery is scheduled for December 8.

## 2014-05-30 NOTE — Progress Notes (Signed)
Consult Note: Gyn-Onc  Annette Tucker 68 y.o. female  CC:  Chief Complaint  Patient presents with  . endometrial adenocarcinoma    HPI: Patient is seen today in consultation at the request of Dr. Pamala Hurry.  patient is a 68 year old gravida 0 who began experiencing some low back pain and then spotting starting in August.. She had an exam which showed significant atrophy and hormone cream was prescribed. She then continued to have some spotting and discharge went to see Dr. Pamala Hurry. Ultrasound was performed that revealed a 4 x 3 x 2 centimeter uterus with a 4.8 milliliter endometrial stripe. There were 2 small lesions measuring 0.5 cm each consistent with polyps. She had been on hormone replacement therapy until 2012. She had been on it approximately 10 years. Pathology from her D&C revealed high-grade endometrial cancer with serous features. For this reason that she is referred to [place] today.  She states since the [facility] she's been feeling well. The low back pain is fairly chronic for her but that was slightly different which is what prompted her visit. She does have a family history of cancer. She has a maternal aunt with postmenopausal breast cancer. She's a paternal first cousin with postmenopausal breast cancer. She is up-to-date on her mammograms. Her last one was in October. She's due for colonoscopy in 2016. Her primary physician is Dr. Tedra Senegal.  Review of Systems:  Constitutional: Denies fever. Skin: No rash Cardiovascular: No chest pain, shortness of breath, or edema  Pulmonary: No cough Gastro Intestinal:  No nausea, vomiting, constipation, or diarrhea reported. No bright red blood per rectum or change in bowel movement.  Genitourinary: No frequency, urgency, or dysuria.  + vaginal bleeding and discharge.  Musculoskeletal: + LBP Neurologic: No weakness, numbness, or change in gait.  Psychology: No changes   Current Meds:  Outpatient Encounter Prescriptions as of 05/30/2014   Medication Sig  . acetaminophen (TYLENOL) 500 MG tablet Take 500 mg by mouth once as needed. For pain  . aspirin 81 MG EC tablet Take 81 mg by mouth every evening. Stopped on 05/10/14 in preparation for surgery.  . Calcium Carbonate-Vitamin D (CALCIUM 600+D) 600-400 MG-UNIT per tablet Take 1 tablet by mouth 2 (two) times daily.  . cholecalciferol (VITAMIN D) 1000 UNITS tablet Take 1,000 Units by mouth every other day.   . Coenzyme Q10 (CO Q-10) 100 MG CAPS Take 1 capsule by mouth daily.  . fish oil-omega-3 fatty acids 1000 MG capsule Take 1 g by mouth daily.   . Flaxseed, Linseed, (FLAX SEED OIL PO) Take 1,000 Units by mouth every evening.   Marland Kitchen ibuprofen (ADVIL,MOTRIN) 600 MG tablet Take 1 tablet (600 mg total) by mouth every 6 (six) hours as needed for mild pain.  . Magnesium 250 MG TABS Take 1 tablet by mouth every evening.   . Multiple Vitamins-Minerals (MULTIVITAMIN WITH MINERALS) tablet Take 1 tablet by mouth daily.    Marland Kitchen PROCTOZONE-HC 2.5 % rectal cream Place 1 application rectally as needed.   . ramipril (ALTACE) 5 MG capsule TAKE 1 CAPSULE (5 MG TOTAL) BY MOUTH DAILY.  . simvastatin (ZOCOR) 20 MG tablet TAKE 1 TABLET (20 MG TOTAL) BY MOUTH AT BEDTIME.  . vitamin E 400 UNIT capsule Take 400 Units by mouth daily.    . [DISCONTINUED] calcium carbonate (OS-CAL) 600 MG TABS Take 600 mg by mouth 2 (two) times daily with a meal.    . loratadine (CLARITIN) 10 MG tablet Take 10 mg by mouth daily as  needed.   . [DISCONTINUED] amLODipine (NORVASC) 5 MG tablet TAKE 1 TABLET (5 MG TOTAL) BY MOUTH DAILY. (Patient not taking: Reported on 05/11/2014)  . [DISCONTINUED] oxyCODONE-acetaminophen (ROXICET) 5-325 MG per tablet Take 1 tablet by mouth every 4 (four) hours as needed for severe pain (1-2 pills every 4-6 hrs as needed).  . [DISCONTINUED] ramipril (ALTACE) 5 MG capsule TAKE 1 CAPSULE (5 MG TOTAL) BY MOUTH DAILY.  . [DISCONTINUED] simvastatin (ZOCOR) 20 MG tablet TAKE 1 TABLET (20 MG TOTAL) BY MOUTH  AT BEDTIME.    Allergy:  Allergies  Allergen Reactions  . Latex Rash    Social Hx:   History   Social History  . Marital Status: Single    Spouse Name: N/A    Number of Children: N/A  . Years of Education: N/A   Occupational History  . Not on file.   Social History Main Topics  . Smoking status: Never Smoker   . Smokeless tobacco: Never Used  . Alcohol Use: Yes     Comment: rarely  . Drug Use: No  . Sexual Activity: Yes    Birth Control/ Protection: Post-menopausal   Other Topics Concern  . Not on file   Social History Narrative    Past Surgical Hx:  Past Surgical History  Procedure Laterality Date  . Tonsillectomy      age 24  . Tmj mandibular advancement    . Wisdom tooth extraction    . Breast surgery      left breast bx - benign  . Colonoscopy  2006  . Dilatation & curettage/hysteroscopy with trueclear N/A 05/17/2014    Procedure: HYSTEROSCOPY WITH POLYPECTOMY;  Surgeon: Claiborne Billings A. Pamala Hurry, MD;  Location: [facility];  Service: Gynecology;  Laterality: N/A;    Past Medical Hx:  Past Medical History  Diagnosis Date  . Hyperlipidemia   . Hypertension   . Heart murmur     as child, no problems   . Seasonal allergies     Oncology Hx:    Endometrial ca   05/17/2014 Initial Diagnosis Endometrial ca    Family Hx:  Family History  Problem Relation Age of Onset  . Mental retardation Mother   . Stroke Mother   . Kidney disease Mother     Vitals:  Blood pressure 164/90, pulse 85, temperature 98.2 F (36.8 C), temperature source Oral, resp. rate 18, height 5\' 3"  (1.6 m), weight 124 lb (56.246 kg).  Physical Exam:  well-nourished well-developed female in no acute distress.  Neck: no lymphadenopathy no thyromegaly.  Lungs: Clear to auscultation.  Cardiovascular: Regular rate and rhythm.  Abdomen: Soft, nontender, nondistended. There are no palpable masses or hepatosplenomegaly.  Groins: No lymphadenopathy.  Extremities: No edema.  Pelvic:  Normal female genitalia. The vagina is markedly atrophic. The cervix is nulliparous. There are no visible lesions. Bimanual examination the cervix is palpably normal. The uterus is of normal size shape and consistency. There are no adnexal masses.  Assessment/Plan:  68 year old with a clinical stage I uterine serous carcinoma. We had a lengthy conversation with the patient regarding her pathologic diagnosis. We will proceed with a CT scan tomorrow to ensure that there is no obvious extrauterine disease. I discussed with the patient that the serous carcinomas are more associated with extrauterine disease. We will also check a CA-125, CBC and chemistries today.  She is tentatively scheduled for surgery on December 8. We'll proceed with a total robotic hysterectomy, bilateral salpingo-oophorectomy, and bilateral pelvic and periaortic lymph node dissection.  She notes that even if she has local disease we may still recommend adjuvant chemotherapy and radiation due to the serous histology.  Surgery risks including but not limited to bleeding, infection, injury to surrounding organs and thromboembolic disease were discussed. She understands that if we need to proceed with a laparotomy she'll require 28 days of prophylactic Lovenox.   She has very limited social support here in [place]. She will try to arrange for someone to come with her the day of surgery. She had originally been planning on catching. I discussed with her that because from a lift she had at least a friend here with her the day of surgery. Her questions were elicited and  answered to her satisfaction. We will follow-up in results of her lab work in her CT scan and we'll contact her with the results.  Bralen Wiltgen A., MD 05/30/2014, 11:06 AM

## 2014-05-30 NOTE — Progress Notes (Signed)
Please put orders in Epic surgery 06-05-14 pre op 05-31-14 Thanks 

## 2014-05-31 ENCOUNTER — Encounter (HOSPITAL_COMMUNITY): Payer: Self-pay

## 2014-05-31 ENCOUNTER — Ambulatory Visit (HOSPITAL_COMMUNITY)
Admission: RE | Admit: 2014-05-31 | Discharge: 2014-05-31 | Disposition: A | Discharge status: Home / Self Care | Payer: Medicare HMO | Source: Ambulatory Visit | Attending: Gynecologic Oncology | Admitting: Gynecologic Oncology

## 2014-05-31 ENCOUNTER — Encounter (HOSPITAL_COMMUNITY)
Admission: RE | Admit: 2014-05-31 | Discharge: 2014-05-31 | Disposition: A | Discharge status: Home / Self Care | Payer: Medicare HMO | Source: Ambulatory Visit | Attending: Gynecologic Oncology | Admitting: Gynecologic Oncology

## 2014-05-31 ENCOUNTER — Ambulatory Visit: Payer: BC Managed Care – PPO | Admitting: Gynecologic Oncology

## 2014-05-31 DIAGNOSIS — I7090 Unspecified atherosclerosis: Secondary | ICD-10-CM | POA: Insufficient documentation

## 2014-05-31 DIAGNOSIS — R109 Unspecified abdominal pain: Secondary | ICD-10-CM | POA: Diagnosis not present

## 2014-05-31 DIAGNOSIS — C541 Malignant neoplasm of endometrium: Secondary | ICD-10-CM | POA: Insufficient documentation

## 2014-05-31 DIAGNOSIS — N281 Cyst of kidney, acquired: Secondary | ICD-10-CM | POA: Insufficient documentation

## 2014-05-31 DIAGNOSIS — M479 Spondylosis, unspecified: Secondary | ICD-10-CM | POA: Insufficient documentation

## 2014-05-31 LAB — URINALYSIS, ROUTINE W REFLEX MICROSCOPIC
Bilirubin Urine: NEGATIVE
Glucose, UA: NEGATIVE mg/dL
Hgb urine dipstick: NEGATIVE
Ketones, ur: NEGATIVE mg/dL
Leukocytes, UA: NEGATIVE
Nitrite: NEGATIVE
Protein, ur: NEGATIVE mg/dL
Specific Gravity, Urine: 1.023 (ref 1.005–1.030)
Urobilinogen, UA: 0.2 mg/dL (ref 0.0–1.0)
pH: 6.5 (ref 5.0–8.0)

## 2014-05-31 LAB — COMPREHENSIVE METABOLIC PANEL
ALT: 14 U/L (ref 0–35)
AST: 22 U/L (ref 0–37)
Albumin: 4.7 g/dL (ref 3.5–5.2)
Alkaline Phosphatase: 81 U/L (ref 39–117)
Anion gap: 14 (ref 5–15)
BUN: 12 mg/dL (ref 6–23)
CO2: 26 mEq/L (ref 19–32)
Calcium: 10.7 mg/dL — ABNORMAL HIGH (ref 8.4–10.5)
Chloride: 100 mEq/L (ref 96–112)
Creatinine, Ser: 0.76 mg/dL (ref 0.50–1.10)
GFR calc Af Amer: >90 mL/min (ref >90)
GFR calc non Af Amer: 87 mL/min — ABNORMAL LOW (ref >90)
Glucose, Bld: 110 mg/dL — ABNORMAL HIGH (ref 70–99)
Potassium: 4.9 mEq/L (ref 3.7–5.3)
Sodium: 140 mEq/L (ref 137–147)
Total Bilirubin: 0.4 mg/dL (ref 0.3–1.2)
Total Protein: 8.5 g/dL — ABNORMAL HIGH (ref 6.0–8.3)

## 2014-05-31 LAB — CBC WITH DIFFERENTIAL/PLATELET
Basophils Absolute: 0 10*3/uL (ref 0.0–0.1)
Basophils Relative: 0 % (ref 0–1)
Eosinophils Absolute: 0 10*3/uL (ref 0.0–0.7)
Eosinophils Relative: 0 % (ref 0–5)
HCT: 43.2 % (ref 36.0–46.0)
Hemoglobin: 14.7 g/dL (ref 12.0–15.0)
Lymphocytes Relative: 10 % — ABNORMAL LOW (ref 12–46)
Lymphs Abs: 0.9 10*3/uL (ref 0.7–4.0)
MCH: 30.9 pg (ref 26.0–34.0)
MCHC: 34 g/dL (ref 30.0–36.0)
MCV: 90.8 fL (ref 78.0–100.0)
Monocytes Absolute: 0.6 10*3/uL (ref 0.1–1.0)
Monocytes Relative: 6 % (ref 3–12)
Neutro Abs: 7.8 10*3/uL — ABNORMAL HIGH (ref 1.7–7.7)
Neutrophils Relative %: 84 % — ABNORMAL HIGH (ref 43–77)
Platelets: 268 10*3/uL (ref 150–400)
RBC: 4.76 MIL/uL (ref 3.87–5.11)
RDW: 13.2 % (ref 11.5–15.5)
WBC: 9.3 10*3/uL (ref 4.0–10.5)

## 2014-05-31 LAB — CA 125: CA 125: 23 U/mL (ref <35)

## 2014-05-31 LAB — CA 125(PREVIOUS METHOD): CA 125: 13.4 U/mL (ref 0.0–30.2)

## 2014-05-31 LAB — ABO/RH: ABO/RH(D): AB POS

## 2014-05-31 MED ORDER — IOHEXOL 300 MG/ML  SOLN
100.0000 mL | Freq: Once | INTRAMUSCULAR | Status: AC | PRN
  Administered 2014-05-31: 100 mL via INTRAVENOUS

## 2014-05-31 NOTE — Pre-Procedure Instructions (Addendum)
EKG REPORT IS IN EPIC FROM 02/26/14 CXR WAS DONE TODAY PREOP AT [facility]. CHART TO FOLLOW UP NURSE WILHEMINA HENDRICK, RN FOR REVIEW OF ALL PREOP TEST RESULTS.

## 2014-05-31 NOTE — Patient Instructions (Addendum)
CLEAR LIQUID DIET ALL DAY - THE DAY BEFORE YOUR SURGERY.  PLEASE SEE LIST OF CLEAR LIQUIDS PROVIDED WITH THESE INSTRUCTIONS FOR SURGERY.  YOUR SURGERY IS SCHEDULED AT [facility]  ON:  Tuesday   December 8th  REPORT TO  SHORT STAY CENTER AT:  12:30 PM   DO NOT EAT  ANYTHING AFTER MIDNIGHT THE NIGHT BEFORE YOUR SURGERY.   NO FOOD, NO CHEWING GUM, NO MINTS, NO CANDIES, NO CHEWING TOBACCO. YOU MAY HAVE CLEAR LIQUIDS TO DRINK FROM MIDNIGHT UNTIL 8:30 AM DAY OF YOUR SURGERY - LIKE WATER, TEA, CRANBERRY JUICE.    NOTHING TO DRINK AFTER 8:30AM DAY OF SURGERY.  PLEASE TAKE THE FOLLOWING MEDICATIONS THE AM OF YOUR SURGERY WITH A FEW SIPS OF WATER:  DO NOT TAKE ANY MEDICATIONS.  .  DO NOT BRING VALUABLES, MONEY, CREDIT CARDS.  DO NOT WEAR JEWELRY, MAKE-UP, NAIL POLISH AND NO METAL PINS OR CLIPS IN YOUR HAIR. CONTACT LENS, DENTURES / PARTIALS, GLASSES SHOULD NOT BE WORN TO SURGERY AND IN MOST CASES-HEARING AIDS WILL NEED TO BE REMOVED.  BRING YOUR GLASSES CASE, ANY EQUIPMENT NEEDED FOR YOUR CONTACT LENS. FOR PATIENTS ADMITTED TO THE HOSPITAL--CHECK OUT TIME THE DAY OF DISCHARGE IS 11:00 AM.  ALL INPATIENT ROOMS ARE PRIVATE - WITH BATHROOM, TELEPHONE, TELEVISION AND WIFI INTERNET.  AFTER YOUR SURGERY - REMEMBER TO DO DEEP BREATHING, COUGHING, AND TURN WHILE LYING IN BED - DO LEG EXERCISES WHILE IN BED  -- TO HELP PREVENT LUNG PROBLEMS AND BLOOD CLOTS.   PLEASE BE AWARE THAT YOU MAY NEED ADDITIONAL BLOOD DRAWN DAY OF YOUR SURGERY  _______________________________________________________________________   [facility] - Preparing for Surgery Before surgery, you can play an important role.  Because skin is not sterile, your skin needs to be as free of germs as possible.  You can reduce the number of germs on your skin by washing with CHG (chlorahexidine gluconate) soap before surgery.  CHG is an antiseptic cleaner which kills germs and bonds with the skin to continue killing germs  even after washing. Please DO NOT use if you have an allergy to CHG or antibacterial soaps.  If your skin becomes reddened/irritated stop using the CHG and inform your nurse when you arrive at Short Stay. Do not shave (including legs and underarms) for at least 48 hours prior to the first CHG shower.  You may shave your face/neck. Please follow these instructions carefully:  1.  Shower with CHG Soap the night before surgery and the  morning of Surgery.  2.  If you choose to wash your hair, wash your hair first as usual with your  normal  shampoo.  3.  After you shampoo, rinse your hair and body thoroughly to remove the  shampoo.                           4.  Use CHG as you would any other liquid soap.  You can apply chg directly  to the skin and wash                       Gently with a scrungie or clean washcloth.  5.  Apply the CHG Soap to your body ONLY FROM THE NECK DOWN.   Do not use on face/ open  Wound or open sores. Avoid contact with eyes, ears mouth and genitals (private parts).                       Wash face,  Genitals (private parts) with your normal soap.             6.  Wash thoroughly, paying special attention to the area where your surgery  will be performed.  7.  Thoroughly rinse your body with warm water from the neck down.  8.  DO NOT shower/wash with your normal soap after using and rinsing off  the CHG Soap.                9.  Pat yourself dry with a clean towel.            10.  Wear clean pajamas.            11.  Place clean sheets on your bed the night of your first shower and do not  sleep with pets. Day of Surgery : Do not apply any lotions/deodorants the morning of surgery.  Please wear clean clothes to the hospital/surgery center.  FAILURE TO FOLLOW THESE INSTRUCTIONS MAY RESULT IN THE CANCELLATION OF YOUR SURGERY PATIENT SIGNATURE_________________________________  NURSE  SIGNATURE__________________________________  ________________________________________________________________________   Annette Tucker  An incentive spirometer is a tool that can help keep your lungs clear and active. This tool measures how well you are filling your lungs with each breath. Taking long deep breaths may help reverse or decrease the chance of developing breathing (pulmonary) problems (especially infection) following:  A long period of time when you are unable to move or be active. BEFORE THE PROCEDURE   If the spirometer includes an indicator to show your best effort, your nurse or respiratory therapist will set it to a desired goal.  If possible, sit up straight or lean slightly forward. Try not to slouch.  Hold the incentive spirometer in an upright position. INSTRUCTIONS FOR USE   Sit on the edge of your bed if possible, or sit up as far as you can in bed or on a chair.  Hold the incentive spirometer in an upright position.  Breathe out normally.  Place the mouthpiece in your mouth and seal your lips tightly around it.  Breathe in slowly and as deeply as possible, raising the piston or the ball toward the top of the column.  Hold your breath for 3-5 seconds or for as long as possible. Allow the piston or ball to fall to the bottom of the column.  Remove the mouthpiece from your mouth and breathe out normally.  Rest for a few seconds and repeat Steps 1 through 7 at least 10 times every 1-2 hours when you are awake. Take your time and take a few normal breaths between deep breaths.  The spirometer may include an indicator to show your best effort. Use the indicator as a goal to work toward during each repetition.  After each set of 10 deep breaths, practice coughing to be sure your lungs are clear. If you have an incision (the cut made at the time of surgery), support your incision when coughing by placing a pillow or rolled up towels firmly against it. Once  you are able to get out of bed, walk around indoors and cough well. You may stop using the incentive spirometer when instructed by your caregiver.  RISKS AND COMPLICATIONS  Take your time so you do not  get dizzy or light-headed.  If you are in pain, you may need to take or ask for pain medication before doing incentive spirometry. It is harder to take a deep breath if you are having pain. AFTER USE  Rest and breathe slowly and easily.  It can be helpful to keep track of a log of your progress. Your caregiver can provide you with a simple table to help with this. If you are using the spirometer at home, follow these instructions: [place] IF:   You are having difficultly using the spirometer.  You have trouble using the spirometer as often as instructed.  Your pain medication is not giving enough relief while using the spirometer.  You develop fever of 100.5 F (38.1 C) or higher. SEEK IMMEDIATE MEDICAL CARE IF:   You cough up bloody sputum that had not been present before.  You develop fever of 102 F (38.9 C) or greater.  You develop worsening pain at or near the incision site. MAKE SURE YOU:   Understand these instructions.  Will watch your condition.  Will get help right away if you are not doing well or get worse. Document Released: 10/26/2006 Document Revised: 09/07/2011 Document Reviewed: 12/27/2006 ExitCare Patient Information 2014 ExitCare, [place].   ________________________________________________________________________  WHAT IS A BLOOD TRANSFUSION? Blood Transfusion Information  A transfusion is the replacement of blood or some of its parts. Blood is made up of multiple cells which provide different functions.  Red blood cells carry oxygen and are used for blood loss replacement.  White blood cells fight against infection.  Platelets control bleeding.  Plasma helps clot blood.  Other blood products are available for specialized needs, such as  hemophilia or other clotting disorders. BEFORE THE TRANSFUSION  Who gives blood for transfusions?   Healthy volunteers who are fully evaluated to make sure their blood is safe. This is blood bank blood. Transfusion therapy is the safest it has ever been in the practice of medicine. Before blood is taken from a donor, a complete history is taken to make sure that person has no history of diseases nor engages in risky social behavior (examples are intravenous drug use or sexual activity with multiple partners). The donor's travel history is screened to minimize risk of transmitting infections, such as malaria. The donated blood is tested for signs of infectious diseases, such as HIV and hepatitis. The blood is then tested to be sure it is compatible with you in order to minimize the chance of a transfusion reaction. If you or a relative donates blood, this is often done in anticipation of surgery and is not appropriate for emergency situations. It takes many days to process the donated blood. RISKS AND COMPLICATIONS Although transfusion therapy is very safe and saves many lives, the main dangers of transfusion include:   Getting an infectious disease.  Developing a transfusion reaction. This is an allergic reaction to something in the blood you were given. Every precaution is taken to prevent this. The decision to have a blood transfusion has been considered carefully by your caregiver before blood is given. Blood is not given unless the benefits outweigh the risks. AFTER THE TRANSFUSION  Right after receiving a blood transfusion, you will usually feel much better and more energetic. This is especially true if your red blood cells have gotten low (anemic). The transfusion raises the level of the red blood cells which carry oxygen, and this usually causes an energy increase.  The nurse administering the transfusion will  monitor you carefully for complications. HOME CARE INSTRUCTIONS  No special  instructions are needed after a transfusion. You may find your energy is better. Speak with your caregiver about any limitations on activity for underlying diseases you may have. SEEK MEDICAL CARE IF:   Your condition is not improving after your transfusion.  You develop redness or irritation at the intravenous (IV) site. SEEK IMMEDIATE MEDICAL CARE IF:  Any of the following symptoms occur over the next 12 hours:  Shaking chills.  You have a temperature by mouth above 102 F (38.9 C), not controlled by medicine.  Chest, back, or muscle pain.  People around you feel you are not acting correctly or are confused.  Shortness of breath or difficulty breathing.  Dizziness and fainting.  You get a rash or develop hives.  You have a decrease in urine output.  Your urine turns a dark color or changes to pink, red, or brown. Any of the following symptoms occur over the next 10 days:  You have a temperature by mouth above 102 F (38.9 C), not controlled by medicine.  Shortness of breath.  Weakness after normal activity.  The white part of the eye turns yellow (jaundice).  You have a decrease in the amount of urine or are urinating less often.  Your urine turns a dark color or changes to pink, red, or brown. Document Released: 06/12/2000 Document Revised: 09/07/2011 Document Reviewed: 01/30/2008 ExitCare Patient Information 2014 ExitCare, [place].  _______________________________________________________________________   CLEAR LIQUID DIET   Foods Allowed                                                                     Foods Excluded  Coffee and tea, regular and decaf                             liquids that you cannot  Plain Jell-O in any flavor                                             see through such as: Fruit ices (not with fruit pulp)                                     milk, soups, orange juice  Iced Popsicles                                    All solid  food Carbonated beverages, regular and diet                                    Cranberry, grape and apple juices Sports drinks like Gatorade Lightly seasoned clear broth or consume(fat free) Sugar, honey syrup  Sample Menu Breakfast  Lunch                                     Supper Cranberry juice                    Beef broth                            Chicken broth Jell-O                                     Grape juice                           Apple juice Coffee or tea                        Jell-O                                      Popsicle                                                Coffee or tea                        Coffee or tea  _____________________________________________________________________

## 2014-06-04 ENCOUNTER — Telehealth: Payer: Self-pay | Admitting: Gynecologic Oncology

## 2014-06-04 NOTE — Telephone Encounter (Signed)
Telephone call to check on pre-operative status.  Patient complaint with pre-operative instructions.  Reinforced NPO after midnight.  No questions or concerns voiced.  Instructed to call for any needs.  Informed of her time change and the need to arrive at short stay at [place] understanding.

## 2014-06-05 ENCOUNTER — Ambulatory Visit (HOSPITAL_COMMUNITY)
Admission: RE | Admit: 2014-06-05 | Discharge: 2014-06-06 | Disposition: A | Discharge status: Home / Self Care | Payer: Medicare HMO | Source: Ambulatory Visit | Attending: Obstetrics & Gynecology | Admitting: Obstetrics & Gynecology

## 2014-06-05 ENCOUNTER — Encounter (HOSPITAL_COMMUNITY): Payer: Self-pay | Admitting: *Deleted

## 2014-06-05 ENCOUNTER — Ambulatory Visit (HOSPITAL_COMMUNITY): Payer: Medicare HMO | Admitting: Anesthesiology

## 2014-06-05 ENCOUNTER — Encounter (HOSPITAL_COMMUNITY)
Admission: RE | Disposition: A | Discharge status: Home / Self Care | Payer: Self-pay | Source: Ambulatory Visit | Attending: Obstetrics & Gynecology

## 2014-06-05 DIAGNOSIS — C541 Malignant neoplasm of endometrium: Secondary | ICD-10-CM | POA: Diagnosis present

## 2014-06-05 DIAGNOSIS — Z7982 Long term (current) use of aspirin: Secondary | ICD-10-CM | POA: Diagnosis not present

## 2014-06-05 DIAGNOSIS — Z803 Family history of malignant neoplasm of breast: Secondary | ICD-10-CM | POA: Diagnosis not present

## 2014-06-05 DIAGNOSIS — Z79899 Other long term (current) drug therapy: Secondary | ICD-10-CM | POA: Diagnosis not present

## 2014-06-05 DIAGNOSIS — Z91040 Latex allergy status: Secondary | ICD-10-CM | POA: Insufficient documentation

## 2014-06-05 DIAGNOSIS — C55 Malignant neoplasm of uterus, part unspecified: Secondary | ICD-10-CM | POA: Diagnosis present

## 2014-06-05 HISTORY — PX: ROBOTIC ASSISTED TOTAL HYSTERECTOMY WITH BILATERAL SALPINGO OOPHERECTOMY: SHX6086

## 2014-06-05 HISTORY — PX: LYMPH NODE DISSECTION: SHX5087

## 2014-06-05 LAB — TYPE AND SCREEN
ABO/RH(D): AB POS
Antibody Screen: NEGATIVE

## 2014-06-05 SURGERY — ROBOTIC ASSISTED TOTAL HYSTERECTOMY WITH BILATERAL SALPINGO OOPHORECTOMY
Anesthesia: General

## 2014-06-05 MED ORDER — ENOXAPARIN SODIUM 40 MG/0.4ML ~~LOC~~ SOLN
40.0000 mg | SUBCUTANEOUS | Status: DC
  Administered 2014-06-06: 40 mg via SUBCUTANEOUS
  Filled 2014-06-05 (×2): qty 0.4

## 2014-06-05 MED ORDER — HYDROMORPHONE HCL 1 MG/ML IJ SOLN
INTRAMUSCULAR | Status: DC | PRN
  Administered 2014-06-05 (×4): 0.5 mg via INTRAVENOUS

## 2014-06-05 MED ORDER — ROCURONIUM BROMIDE 100 MG/10ML IV SOLN
INTRAVENOUS | Status: DC | PRN
  Administered 2014-06-05: 10 mg via INTRAVENOUS
  Administered 2014-06-05: 40 mg via INTRAVENOUS
  Administered 2014-06-05: 10 mg via INTRAVENOUS

## 2014-06-05 MED ORDER — CEFAZOLIN SODIUM-DEXTROSE 2-3 GM-% IV SOLR
2.0000 g | INTRAVENOUS | Status: AC
  Administered 2014-06-05: 2 g via INTRAVENOUS

## 2014-06-05 MED ORDER — DEXAMETHASONE SODIUM PHOSPHATE 10 MG/ML IJ SOLN
INTRAMUSCULAR | Status: AC
Start: 2014-06-05 — End: 2014-06-05
  Filled 2014-06-05: qty 1

## 2014-06-05 MED ORDER — PROPOFOL 10 MG/ML IV BOLUS
INTRAVENOUS | Status: DC | PRN
  Administered 2014-06-05: 80 mg via INTRAVENOUS

## 2014-06-05 MED ORDER — LACTATED RINGERS IR SOLN
Status: DC | PRN
  Administered 2014-06-05: 1000 mL

## 2014-06-05 MED ORDER — HYDROMORPHONE HCL 2 MG/ML IJ SOLN
INTRAMUSCULAR | Status: AC
  Filled 2014-06-05: qty 1

## 2014-06-05 MED ORDER — TRAMADOL HCL 50 MG PO TABS: 50.0000 mg | ORAL_TABLET | Freq: Four times a day (QID) | ORAL | Status: DC | PRN

## 2014-06-05 MED ORDER — ONDANSETRON HCL 4 MG PO TABS: 4.0000 mg | ORAL_TABLET | Freq: Four times a day (QID) | ORAL | Status: DC | PRN

## 2014-06-05 MED ORDER — LACTATED RINGERS IV SOLN: INTRAVENOUS | Status: DC

## 2014-06-05 MED ORDER — HYDROMORPHONE HCL 1 MG/ML IJ SOLN: 0.5000 mg | INTRAMUSCULAR | Status: DC | PRN

## 2014-06-05 MED ORDER — FENTANYL CITRATE 0.05 MG/ML IJ SOLN
INTRAMUSCULAR | Status: AC
  Filled 2014-06-05: qty 5

## 2014-06-05 MED ORDER — LACTATED RINGERS IV SOLN
INTRAVENOUS | Status: DC
  Administered 2014-06-05: 12:00:00 via INTRAVENOUS
  Administered 2014-06-05: 1000 mL via INTRAVENOUS

## 2014-06-05 MED ORDER — ESMOLOL HCL 10 MG/ML IV SOLN
INTRAVENOUS | Status: AC
  Filled 2014-06-05: qty 10

## 2014-06-05 MED ORDER — LIDOCAINE HCL (CARDIAC) 20 MG/ML IV SOLN
INTRAVENOUS | Status: DC | PRN
  Administered 2014-06-05: 50 mg via INTRAVENOUS

## 2014-06-05 MED ORDER — ONDANSETRON HCL 4 MG/2ML IJ SOLN: 4.0000 mg | Freq: Four times a day (QID) | INTRAMUSCULAR | Status: DC | PRN

## 2014-06-05 MED ORDER — ESMOLOL HCL 10 MG/ML IV SOLN
INTRAVENOUS | Status: DC | PRN
  Administered 2014-06-05: 10 mg via INTRAVENOUS

## 2014-06-05 MED ORDER — CEFAZOLIN SODIUM-DEXTROSE 2-3 GM-% IV SOLR
INTRAVENOUS | Status: AC
  Filled 2014-06-05: qty 50

## 2014-06-05 MED ORDER — GLYCOPYRROLATE 0.2 MG/ML IJ SOLN
INTRAMUSCULAR | Status: DC | PRN
  Administered 2014-06-05: .6 mg via INTRAVENOUS

## 2014-06-05 MED ORDER — KCL IN DEXTROSE-NACL 20-5-0.45 MEQ/L-%-% IV SOLN
INTRAVENOUS | Status: DC
  Administered 2014-06-05 – 2014-06-06 (×2): via INTRAVENOUS
  Filled 2014-06-05 (×4): qty 1000

## 2014-06-05 MED ORDER — GLYCOPYRROLATE 0.2 MG/ML IJ SOLN
INTRAMUSCULAR | Status: AC
  Filled 2014-06-05: qty 3

## 2014-06-05 MED ORDER — ENOXAPARIN SODIUM 40 MG/0.4ML ~~LOC~~ SOLN
40.0000 mg | SUBCUTANEOUS | Status: AC
  Administered 2014-06-05: 40 mg via SUBCUTANEOUS
  Filled 2014-06-05: qty 0.4

## 2014-06-05 MED ORDER — SIMVASTATIN 20 MG PO TABS
20.0000 mg | ORAL_TABLET | Freq: Every day | ORAL | Status: DC
  Administered 2014-06-05: 20 mg via ORAL
  Filled 2014-06-05 (×2): qty 1

## 2014-06-05 MED ORDER — DEXAMETHASONE SODIUM PHOSPHATE 10 MG/ML IJ SOLN
INTRAMUSCULAR | Status: DC | PRN
  Administered 2014-06-05: 4 mg via INTRAVENOUS

## 2014-06-05 MED ORDER — AMLODIPINE BESYLATE 2.5 MG PO TABS
2.5000 mg | ORAL_TABLET | Freq: Every morning | ORAL | Status: DC
  Filled 2014-06-05 (×2): qty 1

## 2014-06-05 MED ORDER — RAMIPRIL 5 MG PO CAPS
5.0000 mg | ORAL_CAPSULE | Freq: Every morning | ORAL | Status: DC
  Filled 2014-06-05 (×2): qty 1

## 2014-06-05 MED ORDER — MENTHOL 3 MG MT LOZG
1.0000 | LOZENGE | OROMUCOSAL | Status: DC | PRN
  Administered 2014-06-05: 3 mg via ORAL
  Filled 2014-06-05: qty 9

## 2014-06-05 MED ORDER — OXYCODONE-ACETAMINOPHEN 5-325 MG PO TABS: 1.0000 | ORAL_TABLET | ORAL | Status: DC | PRN

## 2014-06-05 MED ORDER — ASPIRIN EC 81 MG PO TBEC
81.0000 mg | DELAYED_RELEASE_TABLET | Freq: Every evening | ORAL | Status: DC
  Filled 2014-06-05: qty 1

## 2014-06-05 MED ORDER — NEOSTIGMINE METHYLSULFATE 10 MG/10ML IV SOLN
INTRAVENOUS | Status: DC | PRN
  Administered 2014-06-05: 3.5 mg via INTRAVENOUS

## 2014-06-05 MED ORDER — ONDANSETRON HCL 4 MG/2ML IJ SOLN
INTRAMUSCULAR | Status: AC
  Filled 2014-06-05: qty 2

## 2014-06-05 MED ORDER — ONDANSETRON HCL 4 MG/2ML IJ SOLN
INTRAMUSCULAR | Status: DC | PRN
  Administered 2014-06-05: 4 mg via INTRAVENOUS

## 2014-06-05 MED ORDER — PROPOFOL 10 MG/ML IV BOLUS
INTRAVENOUS | Status: AC
  Filled 2014-06-05: qty 20

## 2014-06-05 MED ORDER — FENTANYL CITRATE 0.05 MG/ML IJ SOLN
INTRAMUSCULAR | Status: DC | PRN
  Administered 2014-06-05: 100 ug via INTRAVENOUS
  Administered 2014-06-05 (×3): 50 ug via INTRAVENOUS

## 2014-06-05 MED ORDER — HYDROMORPHONE HCL 1 MG/ML IJ SOLN: 0.2500 mg | INTRAMUSCULAR | Status: DC | PRN

## 2014-06-05 SURGICAL SUPPLY — 55 items
APL SKNCLS STERI-STRIP NONHPOA (GAUZE/BANDAGES/DRESSINGS) ×1
BAG SPEC RTRVL LRG 6X4 10 (ENDOMECHANICALS) ×2
BENZOIN TINCTURE PRP APPL 2/3 (GAUZE/BANDAGES/DRESSINGS) ×2 IMPLANT
CABLE HIGH FREQUENCY MONO STRZ (ELECTRODE) ×2 IMPLANT
CHLORAPREP W/TINT 26ML (MISCELLANEOUS) ×2 IMPLANT
CORDS BIPOLAR (ELECTRODE) ×2 IMPLANT
COVER SURGICAL LIGHT HANDLE (MISCELLANEOUS) ×2 IMPLANT
COVER TIP SHEARS 8 DVNC (MISCELLANEOUS) ×1 IMPLANT
COVER TIP SHEARS 8MM DA VINCI (MISCELLANEOUS) ×1
DRAPE SHEET LG 3/4 BI-LAMINATE (DRAPES) ×4 IMPLANT
DRAPE SURG IRRIG POUCH 19X23 (DRAPES) ×2 IMPLANT
DRAPE TABLE BACK 44X90 PK DISP (DRAPES) ×4 IMPLANT
DRAPE UTILITY XL STRL (DRAPES) ×2 IMPLANT
DRAPE WARM FLUID 44X44 (DRAPE) ×2 IMPLANT
DRSG TEGADERM 2-3/8X2-3/4 SM (GAUZE/BANDAGES/DRESSINGS) ×6 IMPLANT
DRSG TEGADERM 4X4.75 (GAUZE/BANDAGES/DRESSINGS) ×2 IMPLANT
DRSG TEGADERM 6X8 (GAUZE/BANDAGES/DRESSINGS) ×4 IMPLANT
ELECT REM PT RETURN 9FT ADLT (ELECTROSURGICAL) ×2
ELECTRODE REM PT RTRN 9FT ADLT (ELECTROSURGICAL) ×1 IMPLANT
GAUZE SPONGE 2X2 8PLY STRL LF (GAUZE/BANDAGES/DRESSINGS) ×1 IMPLANT
GLOVE BIO SURGEON STRL SZ 6.5 (GLOVE) ×4 IMPLANT
GLOVE BIO SURGEON STRL SZ7.5 (GLOVE) ×4 IMPLANT
GLOVE BIOGEL PI IND STRL 7.0 (GLOVE) ×2 IMPLANT
GLOVE BIOGEL PI INDICATOR 7.0 (GLOVE) ×2
GOWN STRL REUS W/ TWL XL LVL3 (GOWN DISPOSABLE) ×2 IMPLANT
GOWN STRL REUS W/TWL XL LVL3 (GOWN DISPOSABLE) ×4
HOLDER FOLEY CATH W/STRAP (MISCELLANEOUS) ×2 IMPLANT
KIT ACCESSORY DA VINCI DISP (KITS) ×1
KIT ACCESSORY DVNC DISP (KITS) ×1 IMPLANT
KIT BASIN OR (CUSTOM PROCEDURE TRAY) ×2 IMPLANT
MANIPULATOR UTERINE 4.5 ZUMI (MISCELLANEOUS) ×2 IMPLANT
OCCLUDER COLPOPNEUMO (BALLOONS) ×2 IMPLANT
POUCH SPECIMEN RETRIEVAL 10MM (ENDOMECHANICALS) ×4 IMPLANT
SET TUBE IRRIG SUCTION NO TIP (IRRIGATION / IRRIGATOR) ×2 IMPLANT
SHEET LAVH (DRAPES) ×2 IMPLANT
SOLUTION ANTI FOG 6CC (MISCELLANEOUS) ×2 IMPLANT
SOLUTION ELECTROLUBE (MISCELLANEOUS) ×2 IMPLANT
SPONGE GAUZE 2X2 STER 10/PKG (GAUZE/BANDAGES/DRESSINGS) ×1
SPONGE LAP 18X18 X RAY DECT (DISPOSABLE) IMPLANT
STRIP CLOSURE SKIN 1/2X4 (GAUZE/BANDAGES/DRESSINGS) ×2 IMPLANT
SUT VIC AB 0 CT1 27 (SUTURE) ×6
SUT VIC AB 0 CT1 27XBRD ANTBC (SUTURE) ×3 IMPLANT
SUT VIC AB 4-0 PS2 27 (SUTURE) ×4 IMPLANT
SUT VICRYL 0 UR6 27IN ABS (SUTURE) ×2 IMPLANT
SYR 50ML LL SCALE MARK (SYRINGE) ×2 IMPLANT
SYR BULB IRRIGATION 50ML (SYRINGE) IMPLANT
TOWEL OR 17X26 10 PK STRL BLUE (TOWEL DISPOSABLE) ×4 IMPLANT
TRAP SPECIMEN MUCOUS 40CC (MISCELLANEOUS) ×2 IMPLANT
TRAY FOLEY CATH 14FRSI W/METER (CATHETERS) ×2 IMPLANT
TRAY LAPAROSCOPIC (CUSTOM PROCEDURE TRAY) ×2 IMPLANT
TROCAR 12M 150ML BLUNT (TROCAR) ×2 IMPLANT
TROCAR BLADELESS OPT 5 100 (ENDOMECHANICALS) ×2 IMPLANT
TROCAR XCEL 12X100 BLDLESS (ENDOMECHANICALS) ×2 IMPLANT
TUBING INSUFFLATION 10FT LAP (TUBING) ×2 IMPLANT
WATER STERILE IRR 1500ML POUR (IV SOLUTION) ×4 IMPLANT

## 2014-06-05 NOTE — Op Note (Signed)
PATIENT: Annette Tucker DATE OF BIRTH: 05/16/1946 ENCOUNTER DATE: 06/05/2014    Preop Diagnosis: Grade 3 endometrioid adenocarcinoma.   Postoperative Diagnosis: Same.   Surgery: Total robotic hysterectomy bilateral salpingo-oophorectomy, bilateral pelvic and para-aortic lymph node dissection.   Surgeons:  Lucita Lora. Alycia Rossetti, MD; Lahoma Crocker, MD   Anesthesia: General   Estimated blood loss: 25 ml   IVF: 1200 ml   Urine output: 518  ml   Complications: None   Pathology: Uterus, cervix, bilateral tubes and ovaries, bilateral pelvic and para-aortic lymph nodes to pathology.   Operative findings: Normal uterus, cervix, adnexal and abdominal survey, No pathologically enlarged nodes identified.   Procedure: The patient was identified in the preoperative holding area. Informed consent was signed on the chart. Patient was seen history was reviewed and exam was performed.   The patient was then taken to the operating room and placed in the supine position with SCD hose on. She was then placed in the dorsolithotomy position. Her arms were tucked at her side with appropriate precautions on the gel pad. General anesthesia was then induced without difficulty. Shoulder blocks were then placed in the usual fashion with appropriate precautions. A OG-tube was placed to suction. First timeout was performed to confirm the patient, procedure, antibiotic, allergy status, estimated blood loss and OR time. The perineum was then prepped in the usual fashion with Betadine. A 14 French Foley was inserted into the bladder under sterile conditions. A sterile speculum was placed in the vagina. The cervix was without lesions. The cervix was grasped with a single-tooth tenaculum. The dilator without difficulty. A ZUMI with a small Koe ring was placed without difficulty. The abdomen was then prepped with a Chlor prep sponge per protocol.   Patient was then draped after the prep was dried. Second timeout was performed  to confirm the above. After again confirming OG tube placement and it was to suction. A stab-wound was made in left upper quadrant 2 cm below the costal margin on the left in the midclavicular line. A 5 mm operative report was used to assure intra-abdominal placement. The abdomen was insufflated. At this point all points during the procedure the patient's intra-abdominal pressure was not increased over 15 mm of mercury. After insufflation was complete, the patient was placed in deep Trendelenburg position. 25 cm above the pubic symphysis that area was marked the camera port. Bilateral robotic ports were marked 10 cm from the midline incision at approximately 5 angle. A fourth robotic arm was placed 2 cm medial and superior to the ACIS. Under direct visualization each of the trochars was placed into the abdomen. The small bowel was folded on its mesentery to allow visualization to the pelvis. The 5 mm LUQ port was then converted to a 10/12 port under direct visualization.  After assuring adequate visualization, the robot was then docked in the usual fashion. Under direct visualization the robotic instruments replaced.   The peritoneum overlying the right common iliac artery was incised to the level of the duodenum. The ureter was identified and retracted laterally. The nodal bundle extending from the mid common iliac artery to the duodenum which was overlying the vena cava was removed with pinpoint cautery used. The nodal bundle was delivered through the 12/12 trocar. The incision was then extended to the peritoneum overlying the left common iliac artery. The retroperitoneum was entered and the ureter was deviated laterally. The nodal bundle to the level of the IMA was removed. There was adequate hemostasis and the  left para-aortic nodes were removed through the assistant port.   The round ligament on the patient's right side was transected with monopolar cautery. The anterior and posterior leaves of the broad  ligament were then taken down in the usual fashion. The ureter was identified on the medial leaf of the broad ligament. A window was made between the IP and the ureter. The IP was coagulated with bipolar cautery and transected. The posterior leaf of the broad ligament was taken down to the level of the KOH ring. The bladder flap was created using meticulous dissection and pinpoint cautery. The uterine vessels were coagulated with bipolar cautery. The uterine vessels were then transected and the C loop was created. The same procedure was performed on the patient's left side.   The pneumo-occulder in the vagina was then insufflated. The colpotomy was then created in the usual fashion. The specimen was then delivered through the vagina and. Our attention was then drawn to opening the paravesical space on her right side the perirectal space was also opened. The obturator nerve was identified. The nodal bundle extending over the external iliac artery down to the external iliac vein was taken down using sharp dissection and monopolar cautery. The genitofemoral nerve was identified and spared. We continued our dissection down to the level of the obturator nerve inferiorly and the superficial circumflex iliac vein caudally. The cephalad margin was the bifurcation of the iliac artery. The nodal bundle superior to the obturator nerve was taken. All pedicles were noted to be hemostatic the ureter was noted to be well medial of the area of dissection. The nodal bundle was then placed and an Endo catch bag. The same procedure was performed on the left side.  All specimens were delivered to the vagina. The vaginal cuff was closed with a running 0 Vicryl on CT 1 suture. The abdomen and pelvis were copiously irrigated and noted to be hemostatic. The robotic instruments were removed under direct visualization as were the robotic trochars. The pneumoperitoneum was removed. The patient was then taken out of the Trendelenburg  position. Using of 0 Vicryl on a UR 6 needle the midline port fascia was closed after being grasped with allis clamps. The subcutaneous tissues of the port in the left upper quadrant was reapproximated. The skin was closed using 4-0 Vicryl. Steri-Strips and benzoin were applied. The pneumo occluded balloon was removed from the vagina. The vagina was swabbed and noted to be hemostatic.   All instrument needle and Ray-Tec counts were correct x2. The patient tolerated the procedure well and was taken to the recovery room in stable condition. This is Nancy Marus dictating an operative note on patient Annette Tucker.

## 2014-06-05 NOTE — Anesthesia Postprocedure Evaluation (Signed)
  Anesthesia Post-op Note  Patient: Annette Tucker  Procedure(s) Performed: Procedure(s) (LRB): ROBOTIC ASSISTED TOTAL HYSTERECTOMY WITH BILATERAL SALPINGO OOPHORECTOMY (N/A) LYMPH NODE DISSECTION (N/A)  Patient Location: PACU  Anesthesia Type: General  Level of Consciousness: awake and alert   Airway and Oxygen Therapy: Patient Spontanous Breathing  Post-op Pain: mild  Post-op Assessment: Post-op Vital signs reviewed, Patient's Cardiovascular Status Stable, Respiratory Function Stable, Patent Airway and No signs of Nausea or vomiting  Last Vitals:  Filed Vitals:   06/05/14 1330  BP: 120/75  Pulse: 88  Temp:   Resp: 15    Post-op Vital Signs: stable   Complications: No apparent anesthesia complications

## 2014-06-05 NOTE — Progress Notes (Signed)
Social visit- day of surgery  Pt notes mild gas pains in upper abdomen, no other pain. Notes "saturated pad" after walking. Pt hungry.Tolerating clear liquids. Pt notes sore throat.  Nurse questions removal of packing.   PE: Filed Vitals:   06/05/14 1416 06/05/14 1515 06/05/14 1615 06/05/14 1715  BP: 151/74 147/82 128/76 140/73  Pulse: 88 96 94 101  Temp: 98 F (36.7 C) 97.9 F (36.6 C) 98.3 F (36.8 C) 99.2 F (37.3 C)  TempSrc:  Oral Oral Oral  Resp: 14 18 18 18   Height:      Weight:      SpO2: 100% 100% 100% 99%   Gen: well appearing, no distress Gu: small staining on pad. Gentle digital exam of lower vagina- no packing present, small blood tinged watery d/c.   A/P: Recovering well - Instructed nurse no  vaginal packing to be removed and should not probe in am. No mention in op note and no evidence of packing on exam. - Cepacol for throat.  Annette Tucker A. 06/05/2014 8:48 PM

## 2014-06-05 NOTE — Anesthesia Preprocedure Evaluation (Addendum)
Anesthesia Evaluation  Patient identified by MRN, date of birth, ID band Patient awake    Reviewed: Allergy & Precautions, H&P , NPO status , Patient's Chart, lab work & pertinent test results  Airway Mallampati: II  TM Distance: >3 FB Neck ROM: Full    Dental no notable dental hx. (+) Teeth Intact, Dental Advisory Given   Pulmonary neg pulmonary ROS,  breath sounds clear to auscultation  Pulmonary exam normal       Cardiovascular hypertension, Pt. on medications + Valvular Problems/Murmurs Rhythm:Regular Rate:Normal     Neuro/Psych negative neurological ROS  negative psych ROS   GI/Hepatic negative GI ROS, Neg liver ROS,   Endo/Other  Hyperlipidemia  Renal/GU negative Renal ROS  negative genitourinary   Musculoskeletal  (+) Arthritis -, Osteoarthritis,  Osteopenia   Abdominal   Peds  Hematology negative hematology ROS (+)   Anesthesia Other Findings   Reproductive/Obstetrics Endometrial polyp                            Anesthesia Physical Anesthesia Plan  ASA: II  Anesthesia Plan: General   Post-op Pain Management:    Induction: Intravenous  Airway Management Planned: Oral ETT  Additional Equipment:   Intra-op Plan:   Post-operative Plan: Extubation in OR  Informed Consent:   Plan Discussed with: Surgeon  Anesthesia Plan Comments:         Anesthesia Quick Evaluation

## 2014-06-05 NOTE — H&P (View-Only) (Signed)
Consult Note: Gyn-Onc  Annette Tucker 68 y.o. female  CC:  Chief Complaint  Patient presents with  . endometrial adenocarcinoma    HPI: Patient is seen today in consultation at the request of Dr. Pamala Hurry.  patient is a 68 year old gravida 0 who began experiencing some low back pain and then spotting starting in August.. She had an exam which showed significant atrophy and hormone cream was prescribed. She then continued to have some spotting and discharge went to see Dr. Pamala Hurry. Ultrasound was performed that revealed a 4 x 3 x 2 centimeter uterus with a 4.8 milliliter endometrial stripe. There were 2 small lesions measuring 0.5 cm each consistent with polyps. She had been on hormone replacement therapy until 2012. She had been on it approximately 10 years. Pathology from her D&C revealed high-grade endometrial cancer with serous features. For this reason that she is referred to [place] today.  She states since the [facility] she's been feeling well. The low back pain is fairly chronic for her but that was slightly different which is what prompted her visit. She does have a family history of cancer. She has a maternal aunt with postmenopausal breast cancer. She's a paternal first cousin with postmenopausal breast cancer. She is up-to-date on her mammograms. Her last one was in October. She's due for colonoscopy in 2016. Her primary physician is Dr. Tedra Senegal.  Review of Systems:  Constitutional: Denies fever. Skin: No rash Cardiovascular: No chest pain, shortness of breath, or edema  Pulmonary: No cough Gastro Intestinal:  No nausea, vomiting, constipation, or diarrhea reported. No bright red blood per rectum or change in bowel movement.  Genitourinary: No frequency, urgency, or dysuria.  + vaginal bleeding and discharge.  Musculoskeletal: + LBP Neurologic: No weakness, numbness, or change in gait.  Psychology: No changes   Current Meds:  Outpatient Encounter Prescriptions as of 05/30/2014   Medication Sig  . acetaminophen (TYLENOL) 500 MG tablet Take 500 mg by mouth once as needed. For pain  . aspirin 81 MG EC tablet Take 81 mg by mouth every evening. Stopped on 05/10/14 in preparation for surgery.  . Calcium Carbonate-Vitamin D (CALCIUM 600+D) 600-400 MG-UNIT per tablet Take 1 tablet by mouth 2 (two) times daily.  . cholecalciferol (VITAMIN D) 1000 UNITS tablet Take 1,000 Units by mouth every other day.   . Coenzyme Q10 (CO Q-10) 100 MG CAPS Take 1 capsule by mouth daily.  . fish oil-omega-3 fatty acids 1000 MG capsule Take 1 g by mouth daily.   . Flaxseed, Linseed, (FLAX SEED OIL PO) Take 1,000 Units by mouth every evening.   Marland Kitchen ibuprofen (ADVIL,MOTRIN) 600 MG tablet Take 1 tablet (600 mg total) by mouth every 6 (six) hours as needed for mild pain.  . Magnesium 250 MG TABS Take 1 tablet by mouth every evening.   . Multiple Vitamins-Minerals (MULTIVITAMIN WITH MINERALS) tablet Take 1 tablet by mouth daily.    Marland Kitchen PROCTOZONE-HC 2.5 % rectal cream Place 1 application rectally as needed.   . ramipril (ALTACE) 5 MG capsule TAKE 1 CAPSULE (5 MG TOTAL) BY MOUTH DAILY.  . simvastatin (ZOCOR) 20 MG tablet TAKE 1 TABLET (20 MG TOTAL) BY MOUTH AT BEDTIME.  . vitamin E 400 UNIT capsule Take 400 Units by mouth daily.    . [DISCONTINUED] calcium carbonate (OS-CAL) 600 MG TABS Take 600 mg by mouth 2 (two) times daily with a meal.    . loratadine (CLARITIN) 10 MG tablet Take 10 mg by mouth daily as  needed.   . [DISCONTINUED] amLODipine (NORVASC) 5 MG tablet TAKE 1 TABLET (5 MG TOTAL) BY MOUTH DAILY. (Patient not taking: Reported on 05/11/2014)  . [DISCONTINUED] oxyCODONE-acetaminophen (ROXICET) 5-325 MG per tablet Take 1 tablet by mouth every 4 (four) hours as needed for severe pain (1-2 pills every 4-6 hrs as needed).  . [DISCONTINUED] ramipril (ALTACE) 5 MG capsule TAKE 1 CAPSULE (5 MG TOTAL) BY MOUTH DAILY.  . [DISCONTINUED] simvastatin (ZOCOR) 20 MG tablet TAKE 1 TABLET (20 MG TOTAL) BY MOUTH  AT BEDTIME.    Allergy:  Allergies  Allergen Reactions  . Latex Rash    Social Hx:   History   Social History  . Marital Status: Single    Spouse Name: N/A    Number of Children: N/A  . Years of Education: N/A   Occupational History  . Not on file.   Social History Main Topics  . Smoking status: Never Smoker   . Smokeless tobacco: Never Used  . Alcohol Use: Yes     Comment: rarely  . Drug Use: No  . Sexual Activity: Yes    Birth Control/ Protection: Post-menopausal   Other Topics Concern  . Not on file   Social History Narrative    Past Surgical Hx:  Past Surgical History  Procedure Laterality Date  . Tonsillectomy      age 9  . Tmj mandibular advancement    . Wisdom tooth extraction    . Breast surgery      left breast bx - benign  . Colonoscopy  2006  . Dilatation & curettage/hysteroscopy with trueclear N/A 05/17/2014    Procedure: HYSTEROSCOPY WITH POLYPECTOMY;  Surgeon: Claiborne Billings A. Pamala Hurry, MD;  Location: [facility];  Service: Gynecology;  Laterality: N/A;    Past Medical Hx:  Past Medical History  Diagnosis Date  . Hyperlipidemia   . Hypertension   . Heart murmur     as child, no problems   . Seasonal allergies     Oncology Hx:    Endometrial ca   05/17/2014 Initial Diagnosis Endometrial ca    Family Hx:  Family History  Problem Relation Age of Onset  . Mental retardation Mother   . Stroke Mother   . Kidney disease Mother     Vitals:  Blood pressure 164/90, pulse 85, temperature 98.2 F (36.8 C), temperature source Oral, resp. rate 18, height 5\' 3"  (1.6 m), weight 124 lb (56.246 kg).  Physical Exam:  well-nourished well-developed female in no acute distress.  Neck: no lymphadenopathy no thyromegaly.  Lungs: Clear to auscultation.  Cardiovascular: Regular rate and rhythm.  Abdomen: Soft, nontender, nondistended. There are no palpable masses or hepatosplenomegaly.  Groins: No lymphadenopathy.  Extremities: No edema.  Pelvic:  Normal female genitalia. The vagina is markedly atrophic. The cervix is nulliparous. There are no visible lesions. Bimanual examination the cervix is palpably normal. The uterus is of normal size shape and consistency. There are no adnexal masses.  Assessment/Plan:  68 year old with a clinical stage I uterine serous carcinoma. We had a lengthy conversation with the patient regarding her pathologic diagnosis. We will proceed with a CT scan tomorrow to ensure that there is no obvious extrauterine disease. I discussed with the patient that the serous carcinomas are more associated with extrauterine disease. We will also check a CA-125, CBC and chemistries today.  She is tentatively scheduled for surgery on December 8. We'll proceed with a total robotic hysterectomy, bilateral salpingo-oophorectomy, and bilateral pelvic and periaortic lymph node dissection.  She notes that even if she has local disease we may still recommend adjuvant chemotherapy and radiation due to the serous histology.  Surgery risks including but not limited to bleeding, infection, injury to surrounding organs and thromboembolic disease were discussed. She understands that if we need to proceed with a laparotomy she'll require 28 days of prophylactic Lovenox.   She has very limited social support here in [place]. She will try to arrange for someone to come with her the day of surgery. She had originally been planning on catching. I discussed with her that because from a lift she had at least a friend here with her the day of surgery. Her questions were elicited and  answered to her satisfaction. We will follow-up in results of her lab work in her CT scan and we'll contact her with the results.  Cheyeanne Roadcap A., MD 05/30/2014, 11:06 AM

## 2014-06-05 NOTE — Progress Notes (Signed)
MD at bedside. 

## 2014-06-05 NOTE — Interval H&P Note (Signed)
History and Physical Interval Note:  06/05/2014 9:30 AM  Annette Tucker  has presented today for surgery, with the diagnosis of endometrial ca  The various methods of treatment have been discussed with the patient and family. After consideration of risks, benefits and other options for treatment, the patient has consented to  Procedure(s): ROBOTIC ASSISTED TOTAL HYSTERECTOMY WITH BILATERAL SALPINGO OOPHORECTOMY (N/A) LYMPH NODE DISSECTION (N/A) as a surgical intervention .  The patient's history has been reviewed, patient examined, no change in status, stable for surgery.  I have reviewed the patient's chart and labs.  Questions were answered to the patient's satisfaction.     [place] A.

## 2014-06-05 NOTE — Transfer of Care (Signed)
Immediate Anesthesia Transfer of Care Note  Patient: Annette Tucker  Procedure(s) Performed: Procedure(s) (LRB): ROBOTIC ASSISTED TOTAL HYSTERECTOMY WITH BILATERAL SALPINGO OOPHORECTOMY (N/A) LYMPH NODE DISSECTION (N/A)  Patient Location: PACU  Anesthesia Type: General  Level of Consciousness: sedated, patient cooperative and responds to stimulation  Airway & Oxygen Therapy: Patient Spontanous Breathing and Patient connected to face mask oxgen  Post-op Assessment: Report given to PACU RN and Post -op Vital signs reviewed and stable  Post vital signs: Reviewed and stable  Complications: No apparent anesthesia complications

## 2014-06-05 NOTE — Plan of Care (Signed)
Problem: Phase I Progression Outcomes Goal: Pain controlled with appropriate interventions Outcome: Completed/Met Date Met:  06/05/14 Goal: Admission history reviewed Outcome: Completed/Met Date Met:  06/05/14 Goal: Dangle/OOB as tolerated per MD order Outcome: Completed/Met Date Met:  06/05/14 Goal: VS, stable, temp < 100.4 degrees F Outcome: Completed/Met Date Met:  06/05/14 Goal: I & O every 4 hrs or as ordered Outcome: Completed/Met Date Met:  06/05/14 Goal: IS, TCDB as ordered Outcome: Completed/Met Date Met:  06/05/14

## 2014-06-06 ENCOUNTER — Encounter (HOSPITAL_COMMUNITY): Payer: Self-pay | Admitting: Gynecologic Oncology

## 2014-06-06 DIAGNOSIS — C541 Malignant neoplasm of endometrium: Secondary | ICD-10-CM | POA: Diagnosis not present

## 2014-06-06 LAB — BASIC METABOLIC PANEL
Anion gap: 10 (ref 5–15)
BUN: 6 mg/dL (ref 6–23)
CO2: 25 mEq/L (ref 19–32)
Calcium: 9.1 mg/dL (ref 8.4–10.5)
Chloride: 101 mEq/L (ref 96–112)
Creatinine, Ser: 0.73 mg/dL (ref 0.50–1.10)
GFR calc Af Amer: >90 mL/min (ref >90)
GFR calc non Af Amer: 88 mL/min — ABNORMAL LOW (ref >90)
Glucose, Bld: 131 mg/dL — ABNORMAL HIGH (ref 70–99)
Potassium: 4.4 mEq/L (ref 3.7–5.3)
Sodium: 136 mEq/L — ABNORMAL LOW (ref 137–147)

## 2014-06-06 LAB — CBC
HCT: 35.6 % — ABNORMAL LOW (ref 36.0–46.0)
Hemoglobin: 12 g/dL (ref 12.0–15.0)
MCH: 30.5 pg (ref 26.0–34.0)
MCHC: 33.7 g/dL (ref 30.0–36.0)
MCV: 90.4 fL (ref 78.0–100.0)
Platelets: 213 10*3/uL (ref 150–400)
RBC: 3.94 MIL/uL (ref 3.87–5.11)
RDW: 13 % (ref 11.5–15.5)
WBC: 11.3 10*3/uL — ABNORMAL HIGH (ref 4.0–10.5)

## 2014-06-06 NOTE — Discharge Summary (Signed)
Physician Discharge Summary  Patient ID: Annette Tucker MRN: [id number] DOB/AGE: [birth year] 68 y.o.  Admit date: 06/05/2014 Discharge date: 06/06/2014  Admission Diagnoses: Endometrial ca  Discharge Diagnoses:  Principal Problem:   Endometrial ca Active Problems:   Uterine cancer   Discharged Condition:  The patient is in good condition and stable for discharge.    Hospital Course: On 06/05/2014, the patient underwent the following: Procedure(s): ROBOTIC ASSISTED TOTAL HYSTERECTOMY WITH BILATERAL SALPINGO OOPHORECTOMY LYMPH NODE DISSECTION.  The postoperative course was uneventful.  She was discharged to home on postoperative day 1 tolerating a regular diet with no pain reported.  Consults: None  Significant Diagnostic Studies: None  Treatments: surgery: see above  Discharge Exam: Blood pressure 119/61, pulse 78, temperature 98.1 F (36.7 C), temperature source Oral, resp. rate 18, height 5\' 3"  (1.6 m), weight 120 lb (54.432 kg), SpO2 100 %. General appearance: alert, cooperative and no distress Resp: clear to auscultation bilaterally Cardio: regular rate and rhythm, S1, S2 normal, no murmur, click, rub or gallop GI: soft, non-tender; bowel sounds normal; no masses,  no organomegaly and mildly tympanic on percussion Extremities: extremities normal, atraumatic, no cyanosis or edema Incision/Wound: Lap sites to the abdomen with steri strips clean, dry and intact with no drainage or erythema Peri-pad minimally stained with dark blood.  Disposition: 01-Home or Self Care  Discharge Instructions    Call MD for:  difficulty breathing, headache or visual disturbances    Complete by:  As directed      Call MD for:  extreme fatigue    Complete by:  As directed      Call MD for:  hives    Complete by:  As directed      Call MD for:  persistant dizziness or light-headedness    Complete by:  As directed      Call MD for:  persistant nausea and vomiting    Complete by:  As directed     Call MD for:  redness, tenderness, or signs of infection (pain, swelling, redness, odor or green/yellow discharge around incision site)    Complete by:  As directed      Call MD for:  severe uncontrolled pain    Complete by:  As directed      Call MD for:  temperature >100.4    Complete by:  As directed      Diet - low sodium heart healthy    Complete by:  As directed      Driving Restrictions    Complete by:  As directed   No driving for 1 week.  Do not take narcotics and drive.     Increase activity slowly    Complete by:  As directed      Lifting restrictions    Complete by:  As directed   No lifting greater than 10 lbs.     Sexual Activity Restrictions    Complete by:  As directed   No sexual activity, nothing in the vagina, for 8 weeks.            Medication List    TAKE these medications        acetaminophen 500 MG tablet  Commonly known as:  TYLENOL  Take 500 mg by mouth once as needed for moderate pain.     amLODipine 5 MG tablet  Commonly known as:  NORVASC  Take 2.5 mg by mouth every morning.     aspirin 81 MG EC tablet  Take 81  mg by mouth every evening. Stopped on 05/10/14 in preparation for surgery.     CALCIUM 600+D 600-400 MG-UNIT per tablet  Generic drug:  Calcium Carbonate-Vitamin D  Take 1 tablet by mouth 2 (two) times daily. @lunch  and in the evening     cholecalciferol 1000 UNITS tablet  Commonly known as:  VITAMIN D  Take 1,000 Units by mouth every other day.     Co Q-10 100 MG Caps  Take 1 capsule by mouth every morning.     fish oil-omega-3 fatty acids 1000 MG capsule  Take 1 g by mouth every morning.     FLAX SEED OIL PO  Take 1,000 Units by mouth every evening.     ibuprofen 600 MG tablet  Commonly known as:  MOTRIN IB  Take 1 tablet (600 mg total) by mouth every 6 (six) hours as needed for mild pain.     loratadine 10 MG tablet  Commonly known as:  CLARITIN  Take 10 mg by mouth daily as needed for allergies.     PROCTOZONE-HC  2.5 % rectal cream  Generic drug:  hydrocortisone  Place 1 application rectally as needed.     ramipril 5 MG capsule  Commonly known as:  ALTACE  Take 5 mg by mouth every morning.     ramipril 5 MG capsule  Commonly known as:  ALTACE  TAKE 1 CAPSULE (5 MG TOTAL) BY MOUTH DAILY.     simvastatin 20 MG tablet  Commonly known as:  ZOCOR  TAKE 1 TABLET (20 MG TOTAL) BY MOUTH AT BEDTIME.     vitamin E 400 UNIT capsule  Take 400 Units by mouth every morning.      ASK your doctor about these medications        Magnesium 250 MG Tabs  Take 1 tablet by mouth every evening.     multivitamin with minerals tablet  Take 1 tablet by mouth every morning.           Follow-up Information    Follow up with [facility] A[facility] MD On 07/19/2014.   Specialty:  Gynecologic Oncology   Why:  at 1:15pm at the [facility] information:   [place]. [place] [address] [phone]       Greater than thirty minutes were spend for face to face discharge instructions and discharge orders/summary in EPIC.   Signed: CROSS, MELISSA DEAL 06/06/2014, 10:06 AM

## 2014-06-06 NOTE — Plan of Care (Signed)
Problem: Phase II Progression Outcomes Goal: Pain controlled on oral analgesia Outcome: Completed/Met Date Met:  06/06/14 Goal: Progress activity as tolerated unless otherwise ordered Outcome: Completed/Met Date Met:  06/06/14 Goal: Afebrile, VS remain stable Outcome: Completed/Met Date Met:  06/06/14 Goal: Foley discontinued Outcome: Completed/Met Date Met:  06/06/14 Goal: Voiding trials/Bladder training within 48 hrs Outcome: Completed/Met Date Met:  06/06/14 Goal: Incision/dressings dry and intact Outcome: Completed/Met Date Met:  06/06/14 Goal: Remove staples if indicated/incision care Outcome: Not Applicable Date Met:  30/94/07 Goal: Other Phase II Outcomes/Goals Outcome: Completed/Met Date Met:  06/06/14  Problem: Discharge Progression Outcomes Goal: Barriers To Progression Addressed/Resolved Outcome: Completed/Met Date Met:  06/06/14 Goal: Discharge plan in place and appropriate Outcome: Completed/Met Date Met:  06/06/14 Goal: Pain controlled with appropriate interventions Outcome: Completed/Met Date Met:  06/06/14 Goal: Hemodynamically stable Outcome: Completed/Met Date Met:  68/08/81 Goal: Complications resolved/controlled Outcome: Not Applicable Date Met:  04/28/58 Goal: Tolerating diet Outcome: Completed/Met Date Met:  06/06/14 Goal: Discontinue staples (if applicable) Outcome: Not Applicable Date Met:  45/85/92 Goal: Activity appropriate for discharge plan Outcome: Completed/Met Date Met:  06/06/14 Goal: Other Discharge Outcomes/Goals Outcome: Completed/Met Date Met:  06/06/14

## 2014-06-06 NOTE — Discharge Instructions (Signed)
06/06/2014  Return to work: 4-6 weeks if applicable  Activity: 1. Be up and out of the bed during the day.  Take a nap if needed.  You may walk up steps but be careful and use the hand rail.  Stair climbing will tire you more than you think, you may need to stop part way and rest.   2. No lifting or straining for 6 weeks.  3. No driving for 1 week(s).  Do not drive if you are taking narcotic pain medicine.  4. Shower daily.  Use soap and water on your incision and pat dry; don't rub.  No tub baths until cleared by your surgeon.   5. No sexual activity and nothing in the vagina for 8 weeks.  Diet: 1. Low sodium Heart Healthy Diet is recommended.  2. It is safe to use a laxative, such as Miralax or Colace, if you have difficulty moving your bowels.   Wound Care: 1. Keep clean and dry.  Shower daily.  Reasons to call the Doctor:  Fever - Oral temperature greater than 100.4 degrees Fahrenheit  Foul-smelling vaginal discharge  Difficulty urinating  Nausea and vomiting  Increased pain at the site of the incision that is unrelieved with pain medicine.  Difficulty breathing with or without chest pain  New calf pain especially if only on one side  Sudden, continuing increased vaginal bleeding with or without clots.   Contacts: For questions or concerns you should contact:  [name] at [facility]  Dr. Lahoma Crocker at [phone]  [name], NP at [phone]  After Hours: call [phone] and ask for the GYN Oncologist on call  Abdominal Hysterectomy, Care After These instructions give you information on caring for yourself after your procedure. Your doctor may also give you more specific instructions. Call your doctor if you have any problems or questions after your procedure.  HOME CARE It takes 4-6 weeks to recover from this surgery. Follow all of your doctor's instructions.   Only take medicines as told by your doctor.  Change your bandage  as told by your doctor.  Take showers for 4-6 weeks.   Do not douche, use tampons, or have sex (intercourse) for at least 8 weeks or as told.  Follow your doctor's advice about exercise, lifting objects, driving, and general activities.  Get plenty of rest and sleep.  Do not lift anything heavier than a gallon of milk (about 10 pounds [4.5 kilograms]) for the first month after surgery.  Get back to your normal diet as told by your doctor.  Do not drink alcohol until your doctor says it is okay.  Take a medicine to help you poop (laxative) as told by your doctor.  Eating foods high in fiber may help you poop. Eat a lot of raw fruits and vegetables, whole grains, and beans.  Drink enough fluids to keep your pee (urine) clear or pale yellow.  Keep follow-up doctor visits as told. GET HELP IF:  You have chills or fever.  You have puffiness, redness, or pain in area of the cut (incision).  You have yellowish-white fluid (pus) coming from the cut.  You have a bad smell coming from the cut or bandage.  Your cut pulls apart.  You feel dizzy or light-headed.  You have pain or bleeding when you pee.  You keep having watery poop (diarrhea).  You keep feeling sick to your stomach (nauseous) or keep throwing up (vomiting).  You have fluid (discharge) coming from your  vagina.  You have a rash.  You have a reaction to your medicine.  You need stronger pain medicine. GET HELP RIGHT AWAY IF:   You have a fever and your symptoms suddenly get worse.  You have bad belly (abdominal) pain.  You have chest pain.  You are short of breath.  You pass out (faint).  You have pain, puffiness, or redness of your leg.  You bleed a lot from your vagina and notice clumps of tissue (clots). MAKE SURE YOU:   Understand these instructions.  Will watch your condition.  Will get help right away if you are not doing well or get worse. Document Released: 03/24/2008 Document Revised:  06/20/2013 Document Reviewed: 04/07/2013 [facility] Patient Information 2015 [place], [place]. This information is not intended to replace advice given to you by your health care provider. Make sure you discuss any questions you have with your health care provider.

## 2014-06-08 ENCOUNTER — Telehealth: Payer: Self-pay | Admitting: Gynecologic Oncology

## 2014-06-08 NOTE — Telephone Encounter (Signed)
Post op telephone call to check patient status.  Patient describes expected post operative status.  Adequate PO intake reported.  Bladder functioning without difficulty.  Taking magnesium and eating prunes for her bowels.  Passing flatus but no bowel movement yet.  Final pathology discussed.  Advised she would be contacted next week with Dr. Elenora Gamma recommendations.  Pain minimal.  Reportable signs and symptoms reviewed.  Follow up appt given.  Advised to call for any questions or concerns.

## 2014-06-14 ENCOUNTER — Telehealth: Payer: Self-pay | Admitting: Gynecologic Oncology

## 2014-06-14 NOTE — Telephone Encounter (Signed)
Spoke with patient regarding her pathology report. We spoke at length regarding their recommendations for adjuvant therapy. She does have a relatively uncommon tumor and for women with stage IA disease like her, there is about a 30% risk of recurrence without adjuvant therapy. With adjuvant paclitaxel and carboplatin that risk in general goes down to about 10%. Therefore, we recommend this therapy. I discussed that it would be 6 cycles of paclitaxel and carboplatin. We discussed the vaginal cuff brachytherapy would usually started on the third cycle of chemotherapy and could be given then concomitantly on a weekly basis for 5 treatments. Her questions regarding this were elicited in answer to her satisfaction. At this point she's not sure how she would like to proceed. But she would like to be seen by Dr. Marko Plume for a conversation and discussion regarding chemotherapy. We will make that referral for her. She will keep a postoperative appointment with me. She will contact me if she has any questions prior to then.

## 2014-06-15 ENCOUNTER — Other Ambulatory Visit: Payer: Self-pay | Admitting: Gynecologic Oncology

## 2014-06-15 DIAGNOSIS — C55 Malignant neoplasm of uterus, part unspecified: Secondary | ICD-10-CM

## 2014-06-18 ENCOUNTER — Telehealth: Payer: Self-pay | Admitting: *Deleted

## 2014-06-18 NOTE — Telephone Encounter (Signed)
Called pt with updated appt time for 07/19/14 at 10am. Pt agreeable to this time. Told pt to please call us with any further questions or concerns. Pt agreeable to this.

## 2014-06-20 ENCOUNTER — Telehealth: Payer: Self-pay | Admitting: Oncology

## 2014-06-20 NOTE — Telephone Encounter (Signed)
S/W PATIENT AND GAVE NP APPT FOR 1/07 @ 10:30 W/DR. LIVESAY, CHEMO EDU 12/31 @ 10.

## 2014-06-28 ENCOUNTER — Encounter: Payer: Self-pay | Admitting: *Deleted

## 2014-06-28 ENCOUNTER — Encounter: Payer: Self-pay | Admitting: Oncology

## 2014-06-28 ENCOUNTER — Other Ambulatory Visit: Payer: Medicare HMO

## 2014-06-28 NOTE — Progress Notes (Signed)
No episodes as of today.

## 2014-07-05 ENCOUNTER — Telehealth: Payer: Self-pay | Admitting: Oncology

## 2014-07-05 ENCOUNTER — Encounter: Payer: Self-pay | Admitting: Oncology

## 2014-07-05 ENCOUNTER — Ambulatory Visit (HOSPITAL_BASED_OUTPATIENT_CLINIC_OR_DEPARTMENT_OTHER): Payer: Medicare HMO | Admitting: Oncology

## 2014-07-05 ENCOUNTER — Other Ambulatory Visit (HOSPITAL_BASED_OUTPATIENT_CLINIC_OR_DEPARTMENT_OTHER): Payer: Medicare HMO

## 2014-07-05 ENCOUNTER — Other Ambulatory Visit: Payer: Self-pay | Admitting: Oncology

## 2014-07-05 ENCOUNTER — Ambulatory Visit (HOSPITAL_BASED_OUTPATIENT_CLINIC_OR_DEPARTMENT_OTHER): Payer: Medicare HMO

## 2014-07-05 VITALS — BP 163/65 | HR 91 | Temp 98.5°F | Resp 18 | Ht 63.0 in | Wt 117.4 lb

## 2014-07-05 DIAGNOSIS — I10 Essential (primary) hypertension: Secondary | ICD-10-CM

## 2014-07-05 DIAGNOSIS — N281 Cyst of kidney, acquired: Secondary | ICD-10-CM

## 2014-07-05 DIAGNOSIS — C541 Malignant neoplasm of endometrium: Secondary | ICD-10-CM

## 2014-07-05 DIAGNOSIS — M47896 Other spondylosis, lumbar region: Secondary | ICD-10-CM

## 2014-07-05 LAB — CBC WITH DIFFERENTIAL/PLATELET
BASO%: 0.6 % (ref 0.0–2.0)
Basophils Absolute: 0 10*3/uL (ref 0.0–0.1)
EOS%: 1.7 % (ref 0.0–7.0)
Eosinophils Absolute: 0.1 10*3/uL (ref 0.0–0.5)
HCT: 39.7 % (ref 34.8–46.6)
HGB: 12.7 g/dL (ref 11.6–15.9)
LYMPH%: 13.5 % — ABNORMAL LOW (ref 14.0–49.7)
MCH: 29.6 pg (ref 25.1–34.0)
MCHC: 32 g/dL (ref 31.5–36.0)
MCV: 92.6 fL (ref 79.5–101.0)
MONO#: 0.6 10*3/uL (ref 0.1–0.9)
MONO%: 9.3 % (ref 0.0–14.0)
NEUT#: 4.6 10*3/uL (ref 1.5–6.5)
NEUT%: 74.9 % (ref 38.4–76.8)
Platelets: 237 10*3/uL (ref 145–400)
RBC: 4.28 10*6/uL (ref 3.70–5.45)
RDW: 13.5 % (ref 11.2–14.5)
WBC: 6.1 10*3/uL (ref 3.9–10.3)
lymph#: 0.8 10*3/uL — ABNORMAL LOW (ref 0.9–3.3)

## 2014-07-05 LAB — COMPREHENSIVE METABOLIC PANEL (CC13)
ALT: 10 U/L (ref 0–55)
AST: 16 U/L (ref 5–34)
Albumin: 4.1 g/dL (ref 3.5–5.0)
Alkaline Phosphatase: 73 U/L (ref 40–150)
Anion Gap: 9 mEq/L (ref 3–11)
BUN: 11.8 mg/dL (ref 7.0–26.0)
CO2: 27 mEq/L (ref 22–29)
Calcium: 9.6 mg/dL (ref 8.4–10.4)
Chloride: 104 mEq/L (ref 98–109)
Creatinine: 0.8 mg/dL (ref 0.6–1.1)
EGFR: 79 mL/min/{1.73_m2} — ABNORMAL LOW (ref >90)
Glucose: 103 mg/dl (ref 70–140)
Potassium: 4.2 mEq/L (ref 3.5–5.1)
Sodium: 139 mEq/L (ref 136–145)
Total Bilirubin: 0.41 mg/dL (ref 0.20–1.20)
Total Protein: 7.2 g/dL (ref 6.4–8.3)

## 2014-07-05 NOTE — Telephone Encounter (Signed)
, °

## 2014-07-05 NOTE — Progress Notes (Signed)
[facility] NEW PATIENT EVALUATION   Name: Annette Tucker Date: 07/05/2014 MRN: [id number] DOB: 02/29/1946  REFERRING PHYSICIAN: P.Gehrig CC:M.J.Baxley, K.Fogleman, H.Grueber, Juanita Craver, (Maureen Jarrell)  REASON FOR REFERRAL: IA serous endometrial carcinoma   HISTORY OF PRESENT ILLNESS:Annette Tucker is a 69 y.o. 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  Patient has had chronic low back pain which continued 01-2014, with new vaginal spotting then. She saw Dr Pamala Hurry, with exam finding very atrophic vagina and hemorrhoids. With continued spotting whe had [place] which showed 4x3x2 cm uterus with 4.8 mm endometrial stripe and 2 small lesions felt to be polyps. D and C 05-17-14 ([id number] found high grade endometrial carcinoma with serous features. CT AP and CXR 05-31-14 showed no evidence of metastatic disease. She was seen by Dr Alycia Rossetti and taken to robotic assisted hysterectomy BSO pelvic and paraaortic nodes 06-05-14.  Pathology [phone]) had superficially invasive serous carcinoma, stage IA. Post operative course was uncomplicated. Patient discussed pathology findings and recommendations with Dr Alycia Rossetti by phone, and is to see her for post operative follow up visit on 07-19-14. Dr Alycia Rossetti recommended adjuvant taxol carboplatin + vaginal brachytherapy, with expected reduction in risk of recurrence from 30% to 10% with this treatment. She has not seen radiation oncology as yet. She did attend chemotherapy education class prior to this visit.  Peripheral IV access has not been difficult She had flu vaccine   REVIEW OF SYSTEMS: Weight down ~ 4-5 lbs with surgery, appetite not great and some taste disturbance also since surgery. Is eating yogurt, almonds,  water, juices. No GERD, no nausea. Is anxious due to diagnosis and anticipating treatment, but is able to sleep. Some constipation just after surgery, improved with miralax, also on magnesium supplement. No bladder symptoms. No HA. Good visual acuity with corrective lenses. Occasional environmental sinusitis uses claritin prn. Up to date on dental cleanings. No difficulty hearing. No bleeding, not on LMW heparin. Had walked on treadmill 2 miles daily prior to illness, has not resumed. On lifting restrictions. Chronic arthritis symptoms in back, none otherwise. No swelling LE. No peripheral neuropathy Remainder of full 10 point review of systems negative.   ALLERGIES: Latex  PAST MEDICAL/ SURGICAL HISTORY:    G0 On premarin x 2-3 years for vaginal dryness, DCd 2012 Tonsillectomy age 47 Benign left breast biopsy 1990s Colonoscopy 2006 Elevated lipids HTN, controlled without recent change in meds Jaw surgery 1980s, good bite alignment now Never transfused No pneumonia vaccine Mammograms [facility] 03-2014, 3D  CURRENT MEDICATIONS: reviewed as listed now in EMR. Antiemetics as generic zofran and ativan, and generic decadron to pharmacy.   PHARMACY: [facility]   SOCIAL HISTORY:  Single, lives alone in [place]. Originally from [place], then lived for years in [place]. Brother and sister in law live nearby , brother with recent vision problems. Retired from [employer]. Never smoker, no excessive ETOH. Enjoys reading and walking on treadmill  FAMILY HISTORY:  Mother alzheimer's type dementia, CVA, renal disease Father renal failure, HRN No cancer in close family           PHYSICAL EXAM:  height is '5\' 3"'  (1.6 m) and weight is 117 lb 6.4 oz (53.252 kg). Her oral temperature is 98.5 F (36.9 C). Her blood pressure  is 163/65 and her pulse is 91. Her respiration is 18.  Alert, pleasant, cooperative lady looks stated age, good historian. Easily mobile. Respirations not  labored RA  HEENT: PERRL, not icteric. Oral mucosa moist and clear, posterior pharynx also. Neck supple without JVD or thyroid mass. Normal hair pattern  RESPIRATORY:lungs clear to A and P  CARDIAC/ VASCULAR:heart RRR no gallop. Peripheral pulses symmetric and equal. No LE swelling  ABDOMEN: soft, nontender, normally active BS. Surgical incisions healing well, no drainage or erythema. No appreciable HSM or mass  LYMPH NODES:no cervical, supraclavicular, axillary or inguinal nodes  BREASTS:bilaterally without dominant mass, skin or nipple findings  NEUROLOGIC:CN, motor, sensory, cerebellar nonfocal. PSYCH appropriate mood and affect  SKIN:without rash, ecchymosis, petechiae. Scattered small seborrheic keratoses on trunk  MUSCULOSKELETAL:back nontender. Joints not remarkable. Good muscle mass    LABORATORY DATA:  Results for orders placed or performed in visit on 07/05/14 (from the past 48 hour(s))  CBC with Differential     Status: Abnormal   Collection Time: 07/05/14 10:41 AM  Result Value Ref Range   WBC 6.1 3.9 - 10.3 10e3/uL   NEUT# 4.6 1.5 - 6.5 10e3/uL   HGB 12.7 11.6 - 15.9 g/dL   HCT 39.7 34.8 - 46.6 %   Platelets 237 145 - 400 10e3/uL   MCV 92.6 79.5 - 101.0 fL   MCH 29.6 25.1 - 34.0 pg   MCHC 32.0 31.5 - 36.0 g/dL   RBC 4.28 3.70 - 5.45 10e6/uL   RDW 13.5 11.2 - 14.5 %   lymph# 0.8 (L) 0.9 - 3.3 10e3/uL   MONO# 0.6 0.1 - 0.9 10e3/uL   Eosinophils Absolute 0.1 0.0 - 0.5 10e3/uL   Basophils Absolute 0.0 0.0 - 0.1 10e3/uL   NEUT% 74.9 38.4 - 76.8 %   LYMPH% 13.5 (L) 14.0 - 49.7 %   MONO% 9.3 0.0 - 14.0 %   EOS% 1.7 0.0 - 7.0 %   BASO% 0.6 0.0 - 2.0 %  Comprehensive metabolic panel (Cmet) - [facility]     Status: Abnormal   Collection Time: 07/05/14 10:41 AM  Result Value Ref Range   Sodium 139 136 - 145 mEq/L   Potassium 4.2 3.5 - 5.1 mEq/L   Chloride 104 98 - 109 mEq/L   CO2 27 22 - 29 mEq/L   Glucose 103 70 - 140 mg/dl   BUN 11.8 7.0 - 26.0 mg/dL   Creatinine  0.8 0.6 - 1.1 mg/dL   Total Bilirubin 0.41 0.20 - 1.20 mg/dL   Alkaline Phosphatase 73 40 - 150 U/L   AST 16 5 - 34 U/L   ALT 10 0 - 55 U/L   Total Protein 7.2 6.4 - 8.3 g/dL   Albumin 4.1 3.5 - 5.0 g/dL   Calcium 9.6 8.4 - 10.4 mg/dL   Anion Gap 9 3 - 11 mEq/L   EGFR 79 (L) >90 ml/min/1.73 m2    Comment: eGFR is calculated using the CKD-EPI Creatinine Equation (2009)      PATHOLOGY:  for EVELINA, LORE ([id number]) Patient: DAYJA, LOVERIDGE Collected: 06/05/2014 Client: [facility] Accession: [id number] Received: 06/05/2014 Nancy Marus PATHOLOGY FINAL DIAGNOSIS Diagnosis 1. Lymph node, biopsy, right para aortic nodes - ONE LYMPH NODE, NEGATIVE FOR METASTATIC CARCINOMA (0/1). 2. Lymph node, biopsy, left para aortic node - THREE LYMPH NODES, NEGATIVE FOR METASTATIC CARCINOMA (0/3). 3. Lymph nodes, regional resection, right pelvic - FOUR LYMPH NODES, NEGATIVE FOR METASTATIC CARCINOMA (0/4). 4. Lymph nodes, regional resection, left pelvic -  FOUR LYMPH NODES, NEGATIVE FOR METASTATIC CARCINOMA (0/4). 5. Uterus +/- tubes/ovaries, neoplastic, with cervix - SUPERFICIALLY INVASIVE SEROUS CARCINOMA, 0.5 CM, CONFINED WITHIN THE INNER HALF OF THE MYOMETRIUM. - ADJACENT BENIGN ENDOMETRIAL POLYPS WITH NO EVIDENCE OF ATYPIA OR MALIGNANCY. - CERVIX: BENIGN SQUAMOUS MUCOSA AND ENDOCERVICAL MUCOSA, NO DYSPLASIA OR MALIGNANCY. - BILATERAL OVARIES: BENIGN OVARIAN TISSUE WITH ENDOMETRIOSIS AND ENDOSALPINGIOSIS, NO ATYPIA OR MALIGNANCY. - BILATERAL FALLOPIAN TUBES: BENIGN FALLOPIAN TUBAL TISSUE, NO EVIDENCE OF ATYPIA OR MALIGNANCY. - MYOMETRIUM: LEIOMYOMATA.  Microscopic Comment 5. ONCOLOGY TABLE-UTERUS, CARCINOMA Specimen: Uterus, cervix, bilateral ovaries and fallopian tubes. Procedure: Total hysterectomy and bilateral salpingo-oophorectomy. Lymph node sampling performed: Yes. Specimen integrity: Intact. Maximum tumor size: 0.5 cm (glass measurement, slide 5C and 5O) Histologic type:  Serous carcinoma. Grade: High grade. Myometrial invasion: 0.2 cm where myometrium is 1.0 cm in thickness Cervical stromal involvement: No. Extent of involvement of other organs: No. Lymph - vascular invasion: Not identified. 1 of 3 FINAL for DARIENNE, BELLEAU [phone]) Microscopic Comment(continued) Peritoneal washings: N/A Lymph nodes: # examined 12 ; # positive 0 Pelvic lymph nodes: 8 involved of 0 lymph nodes. Para-aortic lymph nodes: 4 involved of 0 lymph nodes. Other (specify involvement and site): N/A TNM code: pT1a, pN0 FIGO Stage (based on pathologic findings, needs clinical correlation): IA Comment: The endometrium is completely submitted for microscopic examination. Sections show a high grade endometrial serous carcinoma with prominent lymphocytic reaction at the periphery of the tumor, measuring 0.5 cm from the glass slide (slide 5C and 5O). The tumor cells display significant nuclear pleomorphism, prominent nucleoli and significant mitotic activity. Tumor is confined within the inner half of the myometrium with no evidence of angiolymphatic invasion identified. MMR stains can be performed upon request.    Patient: JANAY, CANAN Collected: 05/17/2014 Client: [facility] Accession: [id number] Received: 05/17/2014 Juanda Chance DIAGNOSIS Diagnosis Endometrial polyp - FRAGMENTS OF HIGH GRADE ENDOMETRIAL CARCINOMA WITH SEROUS FEATURES. - A FRAGMENT OF ENDOMETRIAL POLYP. - PLEASE SEE COMMENT. Microscopic Comment Sections show three fragments of high grade endometrial carcinoma (architecture grade II, nuclear grade III). Immunostains were performed and the tumor cells are positive for ER, p53 and focally positive for p16 with appropriate controls. In addition, there is a fragment of endometrial polyp. The overall findings are consistent with high grade endometrial carcinoma with serous features.  RADIOGRAPHY: CT ABDOMEN AND PELVIS WITH CONTRAST   05-31-14  TECHNIQUE: Multidetector CT imaging of the abdomen and pelvis was performed using the standard protocol following bolus administration of intravenous contrast.  CONTRAST: 174m OMNIPAQUE IOHEXOL 300 MG/ML SOLN  COMPARISON: None.  FINDINGS: Lower chest: Clear lung bases. No significant pleural or pericardial effusion.  Hepatobiliary: The liver is normal in density without focal abnormality. No evidence of gallstones, gallbladder wall thickening or biliary dilatation.  Pancreas: Unremarkable. No pancreatic ductal dilatation or surrounding inflammatory changes.  Spleen: Normal in size without focal abnormality.  Adrenals/Urinary Tract: Both adrenal glands appear normal.There is a 2.8 cm simple cyst in the interpolar region of the left kidney. The right kidney appears normal aside from a possible tiny nonobstructing caliceal calculus in the interpolar region, best seen on coronal image number 55. There is no hydronephrosis or delay in contrast excretion. The bladder demonstrates no abnormality, although is nearly empty and suboptimally evaluated.  Stomach/Bowel: No evidence of bowel wall thickening, distention or surrounding inflammatory change.There is prominent stool throughout the colon, especially within the rectum.  Vascular/Lymphatic: There are no enlarged abdominal or pelvic lymph nodes. There is mild aortoiliac  atherosclerosis.  Reproductive: The uterus is atrophied and retroverted. No myometrial mass or significant distention of the endometrial cavity demonstrated. There is no evidence of parametrial extension of tumor or adnexal mass.  Other: No evidence of abdominal wall mass or hernia.  Musculoskeletal: No acute or significant osseous findings. There are degenerative changes throughout the lumbar spine.   CHEST 2 VIEW  05-31-14  COMPARISON: 05/27/2009  FINDINGS: The heart size and mediastinal contours are within normal  limits. Both lungs are clear. The visualized skeletal structures are unremarkable.  IMPRESSION: No active cardiopulmonary disease.     IMPRESSION: 1. No evidence of metastatic endometrial carcinoma. The primary malignancy is not well visualized. 2. No evidence of rectal or bladder involvement. No hydronephrosis. 3. Left renal cyst, atherosclerosis and lumbar spondylosis noted.    DISCUSSION: we have reviewed all of history above, including circumstances surrounding presentation and results of evaluation and intervention as above. We have discussed rationale for adjuvant therapy given high grade serous type of malignance. We have discussed recommendation for taxol and carboplatin chemotherapy and options of every 3 weeks vs dose dense with weekly taxol, including usual differences in side effects with the different schedules. She understands that she will need a driver for days of chemo. We have discussed general mechanism of action of the chemotherapeutic agents, antiemetics, premedication steroids. We have discussed outpatient administration of chemotherapy, follow up including counts at this office. We have discussed peripheral vs central line administration. She is aware of possibility of taxol aches and of possibility of gradually progressive peripheral neuropathy.  She prefers treatments on Thursdays. By completion of discussion, she is in agreement with trying adjuvant chemotherapy and prefers weekly taxol with q 3 week carboplatin (dose dense regimen).  Written instructions: We will send prescriptions to your pharmacy. [facility] are fine.   1.decadron (dexamethasone, steroid) 4 mg. Take five tablets +(=20 mg) with food 12 hrs before taxol chemotherapy and five tablets with food 6 hrs before taxol. Dr Marko Plume will let you know if you can decrease the decadron to just one dose prior to subsequent chemo treatments.  2.zofran (ondansetron) 39m One tablet every 8 hrs as needed for  nausea. Will not make you drowsy. Fine to take one tablet AM after chemo whether or not any nausea then, to extend coverage for nausea a bit longer. Other than that dose, fine to take just as needed for nausea   3.ativan (lorazepam) 0.5 mg. One tablet swallow or dissolve under tongue every 6 hrs as needed for nausea. WIll make you drowsy and may make you a little forgetful around a dose, but is very good to relax you. Fine to take one tablet at bedtime night of chemo whether or not any nausea.    IMPRESSION / PLAN:  1.IA high grade serous endometrial carcinoma: post robotic assisted hysterectomy with BSO and bilateral pelvic and para aortic node evaluation 06-05-2014: will begin adjuvant carboplatin taxol using dose dense regimen starting 07-12-14; I will see her back on 07-16-14 and she will keep appointment with Dr GAlycia Rossettion 07-19-14. She still needs referral to radiation oncology 2.hypertension, elevated lipids followed by PCP 3.flu vaccine done 4.atherosclerosis by CT 5.lumbar spondylosis with some chronic back discomfort 6.left renal cyst on CT, without concerning features 7.post benign biopsy left breast 1990s, up to date on mammograms  8.benign skin lesions, followed yearly by Dr GHedy Jacob9. Up to date colonoscopy   Patient  had questions answered to her satisfaction and is in agreement with plan above.  Verbal consent given. She can contact this office for questions or concerns at any time prior to next scheduled visit.  Chemo orders entered. Financial office notified. Cc this note to other MDs involved. Time spent  50 min, including >50% discussion and coordination of care.    Gordy Levan, MD 07/05/2014 12:44 PM

## 2014-07-05 NOTE — Progress Notes (Signed)
Checked in new patient with no issues prior. She did say she had some [place] bills and will ck in to asst. She has appt card and has not traveled. She will see if she can do treatment in [place] and see dr Marko Plume also. I advised her I didn't know if that is possible.

## 2014-07-05 NOTE — Patient Instructions (Signed)
We will send prescriptions to your pharmacy. [facility] are fine.   1.decadron (dexamethasone, steroid) 4 mg. Take five tablets +(=20 mg) with food 12 hrs before taxol chemotherapy and five tablets with food 6 hrs before taxol. Dr Marko Plume will let you know if you can decrease the decadron to just one dose prior to subsequent chemo treatments.  2.zofran (ondansetron) 8mg  One tablet every 8 hrs as needed for nausea. Will not make you drowsy. Fine to take one tablet AM after chemo whether or not any nausea then, to extend coverage for nausea a bit longer. Other than that dose, fine to take just as needed for nausea   3.ativan (lorazepam) 0.5 mg. One tablet swallow or dissolve under tongue every 6 hrs as needed for nausea. WIll make you drowsy and may make you a little forgetful around a dose, but is very good to relax you. Fine to take one tablet at bedtime night of chemo whether or not any nausea.   You can call any time if needed [phone]

## 2014-07-06 ENCOUNTER — Telehealth: Payer: Self-pay | Admitting: *Deleted

## 2014-07-06 MED ORDER — ONDANSETRON HCL 8 MG PO TABS: 8.0000 mg | ORAL_TABLET | Freq: Three times a day (TID) | ORAL | Status: DC | PRN

## 2014-07-06 MED ORDER — ONDANSETRON HCL 8 MG PO TABS: ORAL_TABLET | ORAL | Status: DC

## 2014-07-06 MED ORDER — DEXAMETHASONE 4 MG PO TABS: ORAL_TABLET | ORAL | Status: DC

## 2014-07-06 MED ORDER — ONDANSETRON HCL 8 MG PO TABS
8.0000 mg | ORAL_TABLET | Freq: Three times a day (TID) | ORAL | Status: DC | PRN
Start: 2014-07-06 — End: 2014-07-06

## 2014-07-06 MED ORDER — LORAZEPAM 0.5 MG PO TABS
ORAL_TABLET | ORAL | Status: DC
Start: 2014-07-06 — End: 2014-08-16

## 2014-07-06 NOTE — Telephone Encounter (Signed)
Per staff message and POF I have scheduled appts. Advised scheduler of appts. JMW  

## 2014-07-08 ENCOUNTER — Encounter: Payer: Self-pay | Admitting: Oncology

## 2014-07-09 ENCOUNTER — Telehealth: Payer: Self-pay | Admitting: Oncology

## 2014-07-09 NOTE — Telephone Encounter (Signed)
, °

## 2014-07-10 ENCOUNTER — Other Ambulatory Visit: Payer: Self-pay | Admitting: *Deleted

## 2014-07-10 ENCOUNTER — Telehealth: Payer: Self-pay | Admitting: *Deleted

## 2014-07-10 NOTE — Telephone Encounter (Signed)
Called patient to review decadron premeds and nausea medications. Patient wrote down instructions for decadron. Reviewed with patient side effects from taxol/carboplatin again - patient states she has gone to the chemo class as well. Reviewed all appts with patient on 07/12/14 and appts for the following week. Told patient to please call us back sooner with any questions or concerns.

## 2014-07-10 NOTE — Telephone Encounter (Signed)
-----   Message from Gordy Levan, MD sent at 07/05/2014 12:38 PM EST ----- First chemo with [place] weekly taxol to begin 1-14. Please call her prior to review times of decadron and go over nausea meds. She is very anxious and has lots of questions.  thanks

## 2014-07-11 ENCOUNTER — Other Ambulatory Visit: Payer: Self-pay

## 2014-07-11 DIAGNOSIS — C541 Malignant neoplasm of endometrium: Secondary | ICD-10-CM

## 2014-07-12 ENCOUNTER — Encounter: Payer: Self-pay | Admitting: Gynecologic Oncology

## 2014-07-12 ENCOUNTER — Telehealth: Payer: Self-pay | Admitting: Nutrition

## 2014-07-12 ENCOUNTER — Ambulatory Visit (HOSPITAL_BASED_OUTPATIENT_CLINIC_OR_DEPARTMENT_OTHER): Payer: Medicare HMO

## 2014-07-12 ENCOUNTER — Other Ambulatory Visit (HOSPITAL_BASED_OUTPATIENT_CLINIC_OR_DEPARTMENT_OTHER): Payer: Medicare HMO

## 2014-07-12 DIAGNOSIS — Z5111 Encounter for antineoplastic chemotherapy: Secondary | ICD-10-CM

## 2014-07-12 DIAGNOSIS — C541 Malignant neoplasm of endometrium: Secondary | ICD-10-CM

## 2014-07-12 LAB — CBC WITH DIFFERENTIAL/PLATELET
BASO%: 0.2 % (ref 0.0–2.0)
Basophils Absolute: 0 10*3/uL (ref 0.0–0.1)
EOS%: 0 % (ref 0.0–7.0)
Eosinophils Absolute: 0 10*3/uL (ref 0.0–0.5)
HCT: 41.7 % (ref 34.8–46.6)
HGB: 13.5 g/dL (ref 11.6–15.9)
LYMPH%: 3.7 % — ABNORMAL LOW (ref 14.0–49.7)
MCH: 29.9 pg (ref 25.1–34.0)
MCHC: 32.4 g/dL (ref 31.5–36.0)
MCV: 92.4 fL (ref 79.5–101.0)
MONO#: 0.1 10*3/uL (ref 0.1–0.9)
MONO%: 0.7 % (ref 0.0–14.0)
NEUT#: 8.1 10*3/uL — ABNORMAL HIGH (ref 1.5–6.5)
NEUT%: 95.4 % — ABNORMAL HIGH (ref 38.4–76.8)
Platelets: 224 10*3/uL (ref 145–400)
RBC: 4.52 10*6/uL (ref 3.70–5.45)
RDW: 13.5 % (ref 11.2–14.5)
WBC: 8.5 10*3/uL (ref 3.9–10.3)
lymph#: 0.3 10*3/uL — ABNORMAL LOW (ref 0.9–3.3)

## 2014-07-12 LAB — COMPREHENSIVE METABOLIC PANEL (CC13)
ALT: 11 U/L (ref 0–55)
AST: 16 U/L (ref 5–34)
Albumin: 4.4 g/dL (ref 3.5–5.0)
Alkaline Phosphatase: 79 U/L (ref 40–150)
Anion Gap: 13 mEq/L — ABNORMAL HIGH (ref 3–11)
BUN: 15 mg/dL (ref 7.0–26.0)
CO2: 24 mEq/L (ref 22–29)
Calcium: 9.9 mg/dL (ref 8.4–10.4)
Chloride: 100 mEq/L (ref 98–109)
Creatinine: 1 mg/dL (ref 0.6–1.1)
EGFR: 62 mL/min/{1.73_m2} — ABNORMAL LOW (ref >90)
Glucose: 234 mg/dl — ABNORMAL HIGH (ref 70–140)
Potassium: 4.2 mEq/L (ref 3.5–5.1)
Sodium: 138 mEq/L (ref 136–145)
Total Bilirubin: 0.33 mg/dL (ref 0.20–1.20)
Total Protein: 8 g/dL (ref 6.4–8.3)

## 2014-07-12 MED ORDER — SODIUM CHLORIDE 0.9 % IV SOLN
Freq: Once | INTRAVENOUS | Status: AC
  Administered 2014-07-12: 09:00:00 via INTRAVENOUS

## 2014-07-12 MED ORDER — ONDANSETRON 16 MG/50ML IVPB (CHCC)
INTRAVENOUS | Status: AC
  Filled 2014-07-12: qty 16

## 2014-07-12 MED ORDER — DIPHENHYDRAMINE HCL 50 MG/ML IJ SOLN
50.0000 mg | Freq: Once | INTRAMUSCULAR | Status: AC
  Administered 2014-07-12: 50 mg via INTRAVENOUS

## 2014-07-12 MED ORDER — DIPHENHYDRAMINE HCL 50 MG/ML IJ SOLN
INTRAMUSCULAR | Status: AC
  Filled 2014-07-12: qty 1

## 2014-07-12 MED ORDER — DEXAMETHASONE SODIUM PHOSPHATE 20 MG/5ML IJ SOLN
20.0000 mg | Freq: Once | INTRAMUSCULAR | Status: AC
  Administered 2014-07-12: 20 mg via INTRAVENOUS

## 2014-07-12 MED ORDER — FAMOTIDINE IN NACL 20-0.9 MG/50ML-% IV SOLN
20.0000 mg | Freq: Once | INTRAVENOUS | Status: AC
  Administered 2014-07-12: 20 mg via INTRAVENOUS

## 2014-07-12 MED ORDER — ONDANSETRON 16 MG/50ML IVPB (CHCC)
16.0000 mg | Freq: Once | INTRAVENOUS | Status: AC
  Administered 2014-07-12: 16 mg via INTRAVENOUS

## 2014-07-12 MED ORDER — SODIUM CHLORIDE 0.9 % IV SOLN
430.0000 mg | Freq: Once | INTRAVENOUS | Status: AC
  Administered 2014-07-12: 430 mg via INTRAVENOUS
  Filled 2014-07-12: qty 43

## 2014-07-12 MED ORDER — DEXAMETHASONE SODIUM PHOSPHATE 20 MG/5ML IJ SOLN
INTRAMUSCULAR | Status: AC
Start: 2014-07-12 — End: 2014-07-12
  Filled 2014-07-12: qty 5

## 2014-07-12 MED ORDER — FAMOTIDINE IN NACL 20-0.9 MG/50ML-% IV SOLN
INTRAVENOUS | Status: AC
  Filled 2014-07-12: qty 50

## 2014-07-12 MED ORDER — PACLITAXEL CHEMO INJECTION 300 MG/50ML
80.0000 mg/m2 | Freq: Once | INTRAVENOUS | Status: AC
  Administered 2014-07-12: 126 mg via INTRAVENOUS
  Filled 2014-07-12: qty 21

## 2014-07-12 NOTE — Progress Notes (Signed)
Patient presents to the office without an appointment for an incision check.  She informed Dr. Mariana Kaufman RN on Tues that she had a suture coming from a lap site incision.  Incision assessed.  Suture cut and removed from left abdomen lap site without difficulty.  No drainage noted, mild erythema from irritation from the suture.  Other lap sites healed.  Also voicing decreased sensation to empty her bladder with episodes of mild incontinence starting last pm.  Informed she would need to create a schedule to try to void every two hours even if she does not have a sensation and kegel exercises also discussed.  She is to call if symptoms persist or worsen.  Denies fever, chills, dysuria, or hematuria.  Advised to call for any questions or concerns.  She is to see [name] on 07/19/14.

## 2014-07-12 NOTE — Telephone Encounter (Signed)
Patient was identified to be at risk for malnutrition on the MST secondary to weight loss and poor appetite. Feels like appetite and taste improved. Had chemotherapy today and so far feels okay. States will take the nausea medication as directed. Educated patient on high calories and high protein foods. Mail fact sheets. Questions answered and teach back method used.  **Disclaimer: This note was dictated with voice recognition software. Similar sounding words can inadvertently be transcribed and this note may contain transcription errors which may not have been corrected upon publication of note.**

## 2014-07-12 NOTE — Patient Instructions (Addendum)
[facility] Discharge Instructions for Patients Receiving Chemotherapy  Today you received the following chemotherapy agents Paclitaxel/Carboplatin.  To help prevent nausea and vomiting after your treatment, we encourage you to take your nausea medication as directed.    If you develop nausea and vomiting that is not controlled by your nausea medication, call the clinic.   BELOW ARE SYMPTOMS THAT SHOULD BE REPORTED IMMEDIATELY:  *FEVER GREATER THAN 100.5 F  *CHILLS WITH OR WITHOUT FEVER  NAUSEA AND VOMITING THAT IS NOT CONTROLLED WITH YOUR NAUSEA MEDICATION  *UNUSUAL SHORTNESS OF BREATH  *UNUSUAL BRUISING OR BLEEDING  TENDERNESS IN MOUTH AND THROAT WITH OR WITHOUT PRESENCE OF ULCERS  *URINARY PROBLEMS  *BOWEL PROBLEMS  UNUSUAL RASH Items with * indicate a potential emergency and should be followed up as soon as possible.  Feel free to call the clinic you have any questions or concerns. The clinic phone number is [phone].   Paclitaxel injection What is this medicine? PACLITAXEL (PAK li TAX el) is a chemotherapy drug. It targets fast dividing cells, like cancer cells, and causes these cells to die. This medicine is used to treat ovarian cancer, breast cancer, and other cancers. This medicine may be used for other purposes; ask your health care provider or pharmacist if you have questions. COMMON BRAND NAME(S): Onxol, Taxol What should I tell my health care provider before I take this medicine? They need to know if you have any of these conditions: -blood disorders -irregular heartbeat -infection (especially a virus infection such as chickenpox, cold sores, or herpes) -liver disease -previous or ongoing radiation therapy -an unusual or allergic reaction to paclitaxel, alcohol, polyoxyethylated castor oil, other chemotherapy agents, other medicines, foods, dyes, or preservatives -pregnant or trying to get pregnant -breast-feeding How should I use this  medicine? This drug is given as an infusion into a vein. It is administered in a hospital or clinic by a specially trained health care professional. Talk to your pediatrician regarding the use of this medicine in children. Special care may be needed. Overdosage: If you think you have taken too much of this medicine contact a poison control center or emergency room at once. NOTE: This medicine is only for you. Do not share this medicine with others. What if I miss a dose? It is important not to miss your dose. Call your doctor or health care professional if you are unable to keep an appointment. What may interact with this medicine? Do not take this medicine with any of the following medications: -disulfiram -metronidazole This medicine may also interact with the following medications: -cyclosporine -diazepam -ketoconazole -medicines to increase blood counts like filgrastim, pegfilgrastim, sargramostim -other chemotherapy drugs like cisplatin, doxorubicin, epirubicin, etoposide, teniposide, vincristine -quinidine -testosterone -vaccines -verapamil Talk to your doctor or health care professional before taking any of these medicines: -acetaminophen -aspirin -ibuprofen -ketoprofen -naproxen This list may not describe all possible interactions. Give your health care provider a list of all the medicines, herbs, non-prescription drugs, or dietary supplements you use. Also tell them if you smoke, drink alcohol, or use illegal drugs. Some items may interact with your medicine. What should I watch for while using this medicine? Your condition will be monitored carefully while you are receiving this medicine. You will need important blood work done while you are taking this medicine. This drug may make you feel generally unwell. This is not uncommon, as chemotherapy can affect healthy cells as well as cancer cells. Report any side effects. Continue your course of   treatment even though you feel ill  unless your doctor tells you to stop. In some cases, you may be given additional medicines to help with side effects. Follow all directions for their use. Call your doctor or health care professional for advice if you get a fever, chills or sore throat, or other symptoms of a cold or flu. Do not treat yourself. This drug decreases your body's ability to fight infections. Try to avoid being around people who are sick. This medicine may increase your risk to bruise or bleed. Call your doctor or health care professional if you notice any unusual bleeding. Be careful brushing and flossing your teeth or using a toothpick because you may get an infection or bleed more easily. If you have any dental work done, tell your dentist you are receiving this medicine. Avoid taking products that contain aspirin, acetaminophen, ibuprofen, naproxen, or ketoprofen unless instructed by your doctor. These medicines may hide a fever. Do not become pregnant while taking this medicine. Women should inform their doctor if they wish to become pregnant or think they might be pregnant. There is a potential for serious side effects to an unborn child. Talk to your health care professional or pharmacist for more information. Do not breast-feed an infant while taking this medicine. Men are advised not to father a child while receiving this medicine. What side effects may I notice from receiving this medicine? Side effects that you should report to your doctor or health care professional as soon as possible: -allergic reactions like skin rash, itching or hives, swelling of the face, lips, or tongue -low blood counts - This drug may decrease the number of white blood cells, red blood cells and platelets. You may be at increased risk for infections and bleeding. -signs of infection - fever or chills, cough, sore throat, pain or difficulty passing urine -signs of decreased platelets or bleeding - bruising, pinpoint red spots on the skin,  black, tarry stools, nosebleeds -signs of decreased red blood cells - unusually weak or tired, fainting spells, lightheadedness -breathing problems -chest pain -high or low blood pressure -mouth sores -nausea and vomiting -pain, swelling, redness or irritation at the injection site -pain, tingling, numbness in the hands or feet -slow or irregular heartbeat -swelling of the ankle, feet, hands Side effects that usually do not require medical attention (report to your doctor or health care professional if they continue or are bothersome): -bone pain -complete hair loss including hair on your head, underarms, pubic hair, eyebrows, and eyelashes -changes in the color of fingernails -diarrhea -loosening of the fingernails -loss of appetite -muscle or joint pain -red flush to skin -sweating This list may not describe all possible side effects. Call your doctor for medical advice about side effects. You may report side effects to FDA at 1-800-FDA-1088. Where should I keep my medicine? This drug is given in a hospital or clinic and will not be stored at home. NOTE: This sheet is a summary. It may not cover all possible information. If you have questions about this medicine, talk to your doctor, pharmacist, or health care provider.  2015, Elsevier/Gold Standard. (2012-08-08 16:41:21)  Carboplatin injection What is this medicine? CARBOPLATIN (KAR boe pla tin) is a chemotherapy drug. It targets fast dividing cells, like cancer cells, and causes these cells to die. This medicine is used to treat ovarian cancer and many other cancers. This medicine may be used for other purposes; ask your health care provider or pharmacist if you have questions. COMMON   BRAND NAME(S): Paraplatin What should I tell my health care provider before I take this medicine? They need to know if you have any of these conditions: -blood disorders -hearing problems -kidney disease -recent or ongoing radiation  therapy -an unusual or allergic reaction to carboplatin, cisplatin, other chemotherapy, other medicines, foods, dyes, or preservatives -pregnant or trying to get pregnant -breast-feeding How should I use this medicine? This drug is usually given as an infusion into a vein. It is administered in a hospital or clinic by a specially trained health care professional. Talk to your pediatrician regarding the use of this medicine in children. Special care may be needed. Overdosage: If you think you have taken too much of this medicine contact a poison control center or emergency room at once. NOTE: This medicine is only for you. Do not share this medicine with others. What if I miss a dose? It is important not to miss a dose. Call your doctor or health care professional if you are unable to keep an appointment. What may interact with this medicine? -medicines for seizures -medicines to increase blood counts like filgrastim, pegfilgrastim, sargramostim -some antibiotics like amikacin, gentamicin, neomycin, streptomycin, tobramycin -vaccines Talk to your doctor or health care professional before taking any of these medicines: -acetaminophen -aspirin -ibuprofen -ketoprofen -naproxen This list may not describe all possible interactions. Give your health care provider a list of all the medicines, herbs, non-prescription drugs, or dietary supplements you use. Also tell them if you smoke, drink alcohol, or use illegal drugs. Some items may interact with your medicine. What should I watch for while using this medicine? Your condition will be monitored carefully while you are receiving this medicine. You will need important blood work done while you are taking this medicine. This drug may make you feel generally unwell. This is not uncommon, as chemotherapy can affect healthy cells as well as cancer cells. Report any side effects. Continue your course of treatment even though you feel ill unless your doctor  tells you to stop. In some cases, you may be given additional medicines to help with side effects. Follow all directions for their use. Call your doctor or health care professional for advice if you get a fever, chills or sore throat, or other symptoms of a cold or flu. Do not treat yourself. This drug decreases your body's ability to fight infections. Try to avoid being around people who are sick. This medicine may increase your risk to bruise or bleed. Call your doctor or health care professional if you notice any unusual bleeding. Be careful brushing and flossing your teeth or using a toothpick because you may get an infection or bleed more easily. If you have any dental work done, tell your dentist you are receiving this medicine. Avoid taking products that contain aspirin, acetaminophen, ibuprofen, naproxen, or ketoprofen unless instructed by your doctor. These medicines may hide a fever. Do not become pregnant while taking this medicine. Women should inform their doctor if they wish to become pregnant or think they might be pregnant. There is a potential for serious side effects to an unborn child. Talk to your health care professional or pharmacist for more information. Do not breast-feed an infant while taking this medicine. What side effects may I notice from receiving this medicine? Side effects that you should report to your doctor or health care professional as soon as possible: -allergic reactions like skin rash, itching or hives, swelling of the face, lips, or tongue -signs of infection - fever   or chills, cough, sore throat, pain or difficulty passing urine -signs of decreased platelets or bleeding - bruising, pinpoint red spots on the skin, black, tarry stools, nosebleeds -signs of decreased red blood cells - unusually weak or tired, fainting spells, lightheadedness -breathing problems -changes in hearing -changes in vision -chest pain -high blood pressure -low blood counts - This  drug may decrease the number of white blood cells, red blood cells and platelets. You may be at increased risk for infections and bleeding. -nausea and vomiting -pain, swelling, redness or irritation at the injection site -pain, tingling, numbness in the hands or feet -problems with balance, talking, walking -trouble passing urine or change in the amount of urine Side effects that usually do not require medical attention (report to your doctor or health care professional if they continue or are bothersome): -hair loss -loss of appetite -metallic taste in the mouth or changes in taste This list may not describe all possible side effects. Call your doctor for medical advice about side effects. You may report side effects to FDA at 1-800-FDA-1088. Where should I keep my medicine? This drug is given in a hospital or clinic and will not be stored at home. NOTE: This sheet is a summary. It may not cover all possible information. If you have questions about this medicine, talk to your doctor, pharmacist, or health care provider.  2015, Elsevier/Gold Standard. (2007-09-20 14:38:05)   

## 2014-07-13 ENCOUNTER — Telehealth: Payer: Self-pay | Admitting: *Deleted

## 2014-07-13 NOTE — Telephone Encounter (Signed)
Reports face/chest is red and warm-made her aware this is due to the steroids and should resolve in a day or so. OK to take Benadryl OTC if she is uncomfortable. MD will discuss this at her visit next week and may alter her premeds. BP was elevated this am at 169/102. She then took her Altace. Will let office know if BP does not come down afterwards.  Wanted MD aware she had some bladder leakage yesterday-significant amount. None today-she is going to [place] on more of a schedule now.  Eating well-no nausea.

## 2014-07-15 ENCOUNTER — Other Ambulatory Visit: Payer: Self-pay | Admitting: Oncology

## 2014-07-15 DIAGNOSIS — C541 Malignant neoplasm of endometrium: Secondary | ICD-10-CM

## 2014-07-15 DIAGNOSIS — R32 Unspecified urinary incontinence: Secondary | ICD-10-CM

## 2014-07-16 ENCOUNTER — Telehealth: Payer: Self-pay | Admitting: Oncology

## 2014-07-16 ENCOUNTER — Telehealth: Payer: Self-pay | Admitting: *Deleted

## 2014-07-16 ENCOUNTER — Encounter: Payer: Self-pay | Admitting: Oncology

## 2014-07-16 ENCOUNTER — Ambulatory Visit (HOSPITAL_BASED_OUTPATIENT_CLINIC_OR_DEPARTMENT_OTHER): Payer: Medicare HMO | Admitting: Oncology

## 2014-07-16 ENCOUNTER — Other Ambulatory Visit (HOSPITAL_BASED_OUTPATIENT_CLINIC_OR_DEPARTMENT_OTHER): Payer: Medicare HMO

## 2014-07-16 VITALS — BP 125/80 | HR 115 | Temp 98.1°F | Resp 18 | Ht 63.0 in | Wt 113.3 lb

## 2014-07-16 DIAGNOSIS — R110 Nausea: Secondary | ICD-10-CM

## 2014-07-16 DIAGNOSIS — R32 Unspecified urinary incontinence: Secondary | ICD-10-CM

## 2014-07-16 DIAGNOSIS — N281 Cyst of kidney, acquired: Secondary | ICD-10-CM

## 2014-07-16 DIAGNOSIS — R319 Hematuria, unspecified: Secondary | ICD-10-CM

## 2014-07-16 DIAGNOSIS — C541 Malignant neoplasm of endometrium: Secondary | ICD-10-CM

## 2014-07-16 DIAGNOSIS — T451X5A Adverse effect of antineoplastic and immunosuppressive drugs, initial encounter: Secondary | ICD-10-CM

## 2014-07-16 DIAGNOSIS — M47896 Other spondylosis, lumbar region: Secondary | ICD-10-CM

## 2014-07-16 LAB — CBC WITH DIFFERENTIAL/PLATELET
BASO%: 0.1 % (ref 0.0–2.0)
Basophils Absolute: 0 10*3/uL (ref 0.0–0.1)
EOS%: 0.8 % (ref 0.0–7.0)
Eosinophils Absolute: 0.1 10*3/uL (ref 0.0–0.5)
HCT: 42.4 % (ref 34.8–46.6)
HGB: 13.7 g/dL (ref 11.6–15.9)
LYMPH%: 14.5 % (ref 14.0–49.7)
MCH: 29.7 pg (ref 25.1–34.0)
MCHC: 32.4 g/dL (ref 31.5–36.0)
MCV: 91.8 fL (ref 79.5–101.0)
MONO#: 0.2 10*3/uL (ref 0.1–0.9)
MONO%: 2.7 % (ref 0.0–14.0)
NEUT#: 5.3 10*3/uL (ref 1.5–6.5)
NEUT%: 81.9 % — ABNORMAL HIGH (ref 38.4–76.8)
Platelets: 217 10*3/uL (ref 145–400)
RBC: 4.62 10*6/uL (ref 3.70–5.45)
RDW: 13.3 % (ref 11.2–14.5)
WBC: 6.4 10*3/uL (ref 3.9–10.3)
lymph#: 0.9 10*3/uL (ref 0.9–3.3)

## 2014-07-16 LAB — URINALYSIS, MICROSCOPIC - CHCC
Bilirubin (Urine): NEGATIVE
Glucose: NEGATIVE mg/dL
Ketones: NEGATIVE mg/dL
Nitrite: NEGATIVE
Protein: NEGATIVE mg/dL
Specific Gravity, Urine: 1.01 (ref 1.003–1.035)
Urobilinogen, UR: 0.2 mg/dL (ref 0.2–1)
pH: 6 (ref 4.6–8.0)

## 2014-07-16 LAB — COMPREHENSIVE METABOLIC PANEL (CC13)
ALT: 14 U/L (ref 0–55)
AST: 17 U/L (ref 5–34)
Albumin: 4.3 g/dL (ref 3.5–5.0)
Alkaline Phosphatase: 67 U/L (ref 40–150)
Anion Gap: 10 mEq/L (ref 3–11)
BUN: 17.7 mg/dL (ref 7.0–26.0)
CO2: 26 mEq/L (ref 22–29)
Calcium: 10 mg/dL (ref 8.4–10.4)
Chloride: 100 mEq/L (ref 98–109)
Creatinine: 0.8 mg/dL (ref 0.6–1.1)
EGFR: 72 mL/min/{1.73_m2} — ABNORMAL LOW (ref >90)
Glucose: 107 mg/dl (ref 70–140)
Potassium: 4.8 mEq/L (ref 3.5–5.1)
Sodium: 137 mEq/L (ref 136–145)
Total Bilirubin: 1 mg/dL (ref 0.20–1.20)
Total Protein: 7.6 g/dL (ref 6.4–8.3)

## 2014-07-16 NOTE — Telephone Encounter (Signed)
, °

## 2014-07-16 NOTE — Telephone Encounter (Signed)
Per staff message and POF I have scheduled appts. Advised scheduler of appts. JMW  

## 2014-07-16 NOTE — Progress Notes (Signed)
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 She is receiving dose dense regimen, chosen due to concerns about tolerance of the taxol. She has not had consultation with Dr Sondra Come yet.  She met with [facility] on 07-12-14.  Patient has been concerned about multiple symptoms since the first chemo, but has had no severe side effects. She took premedication decadron 20 mg 12 hrs and 6 hrs prior to first taxol, which will be decreased now to the 12 hour prior dose as she had no allergic complications with that first treatment. Peripheral IV access was easily accomplished. She took antiemetics as instructed night of chemo and the following AM. She had facial flushing from steroids, resolved in <24 hours. Bowels moved on day 3, but have not moved since and she will try miralax. She had nausea most of day 3, with poor po intake of food and liquid, but did not take antiemetic until late afternoon that day (with improvement). She saw slight pink tinge to urine once, has decreased voiding sensation since surgery, seems better trying to void every 2 hrs on schedule as recommended by gyn oncology. She has slept well. She had soreness across abdomen, possibly from low back, better with hot shower, walking and stretching out in bed. She had aches in jaw bilaterally which seem to have been from taxol, resolved with heating pad (history of jaw surgery). She has had slight blood from nose when she blows;platelets are fine and I have recommended saline nasal spray. No peripheral neuropathy  No PAC Flu vaccine done  She drank cranberry juice during visit without difficulty  ONCOLOGIC HISTORY   Endometrial ca   05/17/2014 Initial Diagnosis Endometrial ca  Patient has had chronic low back pain  which continued 01-2014, with new vaginal spotting then. She saw Dr Pamala Hurry, with exam finding very atrophic vagina and hemorrhoids. With continued spotting whe had [place] which showed 4x3x2 cm uterus with 4.8 mm endometrial stripe and 2 small lesions felt to be polyps. D and C 05-17-14 ([id number] found high grade endometrial carcinoma with serous features. CT AP and CXR 05-31-14 showed no evidence of metastatic disease. She was seen by Dr Alycia Rossetti and taken to robotic assisted hysterectomy BSO pelvic and paraaortic nodes 06-05-14. Pathology [phone]) had superficially invasive serous carcinoma, stage IA. Post operative course was uncomplicated. Patient discussed pathology findings and recommendations with Dr Alycia Rossetti by phone, and is to see her for post operative follow up visit on 07-19-14. Dr Alycia Rossetti recommended adjuvant taxol carboplatin + vaginal brachytherapy. First carbo taxol given 07-12-14.  Review of systems as above, also: No SOB or cough. BP up prior to taking usual Altace.  Remainder of 10 point Review of Systems negative.  Objective:  Vital signs in last 24 hours:  BP 125/80 mmHg  Pulse 115  Temp(Src) 98.1 F (36.7 C) (Oral)  Resp 18  Ht _0  (1.6 m)  Wt 113 lb 4.8 oz (51.393 kg)  BMI 20.08 kg/m2  Weight down 4 lbs. Alert, oriented and appropriate. Ambulatory without assistance difficulty.  Alopecia  HEENT:PERRL, sclerae not icteric. Oral mucosa moist without lesions, posterior pharynx clear.  Neck supple. No JVD.  Lymphatics:no cervical,supraclavicular or inguinal adenopathy Resp: clear to auscultation bilaterally and normal percussion bilaterally Cardio: regular rate and rhythm. No gallop. GI: soft, nontender, not distended, no  mass or organomegaly. Normally active bowel sounds. Surgical incisions not remarkable, left lateral fine post stitch removal. Musculoskeletal/ Extremities: without pitting edema, cords, tenderness Neuro: no peripheral neuropathy. Otherwise nonfocal. PSYCH  appropriate mood and affect Skin without rash, ecchymosis, petechiae   Lab Results:  Results for orders placed or performed in visit on 07/16/14  CBC with Differential  Result Value Ref Range   WBC 6.4 3.9 - 10.3 10e3/uL   NEUT# 5.3 1.5 - 6.5 10e3/uL   HGB 13.7 11.6 - 15.9 g/dL   HCT 42.4 34.8 - 46.6 %   Platelets 217 145 - 400 10e3/uL   MCV 91.8 79.5 - 101.0 fL   MCH 29.7 25.1 - 34.0 pg   MCHC 32.4 31.5 - 36.0 g/dL   RBC 4.62 3.70 - 5.45 10e6/uL   RDW 13.3 11.2 - 14.5 %   lymph# 0.9 0.9 - 3.3 10e3/uL   MONO# 0.2 0.1 - 0.9 10e3/uL   Eosinophils Absolute 0.1 0.0 - 0.5 10e3/uL   Basophils Absolute 0.0 0.0 - 0.1 10e3/uL   NEUT% 81.9 (H) 38.4 - 76.8 %   LYMPH% 14.5 14.0 - 49.7 %   MONO% 2.7 0.0 - 14.0 %   EOS% 0.8 0.0 - 7.0 %   BASO% 0.1 0.0 - 2.0 %   CMET available after visit normal (EGFR 72)  UA 3-6 RBC/ WBC, few bacteria, occ epithelial, small LE, sp gr 1.010. Urine culture pending.   Studies/Results:  No results found.  Medications: I have reviewed the patient's current medications. Add saline nose spray. Use antiemetics at least AM day 3 also, and prn. Premed decadron decreased to 20 mg 12 hrs prior to taxol, + IV dose at chemo infusion  DISCUSSION: all information above reviewed and discussed. Explained again that it is best to have driver on days of chemo. DIscussed pushing po fluids   Written and oral patient instructions as follows:  Decadron (dexamethasone, steroid)  4 mg :    Five tablets with food 12 hours before chemo, so take at ~ midnight on Wed 1-20 before chemo at noon on Thurs 1-21   Take ondansetron (zofran) first thing in AM on Sat 1-23  Push fluids today and rest of week  Saline nose spray every hour while awake if nose is dry/ if you see a little blood   Assessment/Plan: 1.IA high grade serous endometrial carcinoma: post robotic assisted hysterectomy with BSO and bilateral pelvic and para aortic node evaluation 06-05-2014. Day 1 cycle 1 dose  dense carbo taxol given 07-12-14, counts still ok and other status as above. She will see Dr Alycia Rossetti for post op visit 07-19-14, with day 8 cycle 1 that day. I will see her with day 8 on 1-28. WIll request consultation with Dr Sondra Come for consideration of vaginal brachytherapy. 2.hypertension, elevated lipids followed by PCP 3.flu vaccine done 4. Weight down 4 lbs: she will push po fluids, follow 5.slight hematuria and decreased bladder sensation: follow up urine culture pending, but plain UA does not require antibiotics today. Regular voiding. Discuss with gyn onc at upcoming visit. 6.left renal cyst on CT, without concerning features 7.post benign biopsy left breast 1990s, up to date on mammograms  8.benign skin lesions, followed yearly by Dr Hedy Jacob 9. Up to date colonoscopy 10.atherosclerosis by CT 11.lumbar spondylosis with some chronic back discomfort  All questions answered. Chemo orders confirmed. She understands that she can call at any time if questions or concerns. Time spent 25 min including >50% counseling and coordination of  care.   Gordy Levan, MD   07/16/2014, 8:36 AM

## 2014-07-16 NOTE — Patient Instructions (Addendum)
Decadron (dexamethasone, steroid)  4 mg :    Five tablets with food 12 hours before chemo, so take at ~ midnight on Wed 1-20 before chemo at noon on Thurs 1-21   Take ondansetron (zofran) first thing in AM on Sat 1-23  Push fluids today and rest of week  Saline nose spray every hour while awake if nose is dry/ if you see a little blood

## 2014-07-17 ENCOUNTER — Telehealth: Payer: Self-pay | Admitting: *Deleted

## 2014-07-17 ENCOUNTER — Other Ambulatory Visit: Payer: Self-pay | Admitting: Oncology

## 2014-07-17 ENCOUNTER — Telehealth: Payer: Self-pay | Admitting: Oncology

## 2014-07-17 DIAGNOSIS — C541 Malignant neoplasm of endometrium: Secondary | ICD-10-CM

## 2014-07-17 LAB — URINE CULTURE

## 2014-07-17 NOTE — Telephone Encounter (Signed)
Called and left VM on patient's cell phone letting her know she does not have a UTI as noted below by Dr. Marko Plume. Told patient to give [place] a call back with any other concerns or issues.

## 2014-07-17 NOTE — Telephone Encounter (Signed)
, °

## 2014-07-17 NOTE — Telephone Encounter (Signed)
-----   Message from Gordy Levan, MD sent at 07/17/2014 11:39 AM EST ----- Labs seen and need follow up: please let her know no urinary tract infection

## 2014-07-19 ENCOUNTER — Ambulatory Visit: Payer: Medicare HMO | Attending: Gynecologic Oncology | Admitting: Gynecologic Oncology

## 2014-07-19 ENCOUNTER — Ambulatory Visit (HOSPITAL_BASED_OUTPATIENT_CLINIC_OR_DEPARTMENT_OTHER): Payer: Medicare HMO | Admitting: *Deleted

## 2014-07-19 ENCOUNTER — Encounter: Payer: Self-pay | Admitting: Gynecologic Oncology

## 2014-07-19 ENCOUNTER — Ambulatory Visit (HOSPITAL_BASED_OUTPATIENT_CLINIC_OR_DEPARTMENT_OTHER): Payer: Medicare HMO

## 2014-07-19 VITALS — BP 122/75 | HR 114 | Temp 98.2°F | Resp 16 | Ht 63.0 in | Wt 113.9 lb

## 2014-07-19 DIAGNOSIS — Z483 Aftercare following surgery for neoplasm: Secondary | ICD-10-CM

## 2014-07-19 DIAGNOSIS — C541 Malignant neoplasm of endometrium: Secondary | ICD-10-CM

## 2014-07-19 DIAGNOSIS — Z5111 Encounter for antineoplastic chemotherapy: Secondary | ICD-10-CM

## 2014-07-19 LAB — CBC WITH DIFFERENTIAL/PLATELET
BASO%: 0 % (ref 0.0–2.0)
Basophils Absolute: 0 10*3/uL (ref 0.0–0.1)
EOS%: 0 % (ref 0.0–7.0)
Eosinophils Absolute: 0 10*3/uL (ref 0.0–0.5)
HCT: 35.7 % (ref 34.8–46.6)
HGB: 12.1 g/dL (ref 11.6–15.9)
LYMPH%: 4.3 % — ABNORMAL LOW (ref 14.0–49.7)
MCH: 30 pg (ref 25.1–34.0)
MCHC: 33.9 g/dL (ref 31.5–36.0)
MCV: 88.6 fL (ref 79.5–101.0)
MONO#: 0.1 10*3/uL (ref 0.1–0.9)
MONO%: 1 % (ref 0.0–14.0)
NEUT#: 4.8 10*3/uL (ref 1.5–6.5)
NEUT%: 94.7 % — ABNORMAL HIGH (ref 38.4–76.8)
Platelets: 222 10*3/uL (ref 145–400)
RBC: 4.03 10*6/uL (ref 3.70–5.45)
RDW: 12.7 % (ref 11.2–14.5)
WBC: 5.1 10*3/uL (ref 3.9–10.3)
lymph#: 0.2 10*3/uL — ABNORMAL LOW (ref 0.9–3.3)

## 2014-07-19 LAB — COMPREHENSIVE METABOLIC PANEL (CC13)
ALT: 14 U/L (ref 0–55)
AST: 17 U/L (ref 5–34)
Albumin: 4 g/dL (ref 3.5–5.0)
Alkaline Phosphatase: 61 U/L (ref 40–150)
Anion Gap: 11 mEq/L (ref 3–11)
BUN: 19.3 mg/dL (ref 7.0–26.0)
CO2: 21 mEq/L — ABNORMAL LOW (ref 22–29)
Calcium: 9.2 mg/dL (ref 8.4–10.4)
Chloride: 105 mEq/L (ref 98–109)
Creatinine: 0.7 mg/dL (ref 0.6–1.1)
EGFR: 84 mL/min/{1.73_m2} — ABNORMAL LOW (ref >90)
Glucose: 148 mg/dl — ABNORMAL HIGH (ref 70–140)
Potassium: 4.8 mEq/L (ref 3.5–5.1)
Sodium: 137 mEq/L (ref 136–145)
Total Bilirubin: 0.23 mg/dL (ref 0.20–1.20)
Total Protein: 7.1 g/dL (ref 6.4–8.3)

## 2014-07-19 MED ORDER — DEXAMETHASONE SODIUM PHOSPHATE 20 MG/5ML IJ SOLN
INTRAMUSCULAR | Status: AC
  Filled 2014-07-19: qty 5

## 2014-07-19 MED ORDER — SODIUM CHLORIDE 0.9 % IV SOLN
Freq: Once | INTRAVENOUS | Status: AC
  Administered 2014-07-19: 13:00:00 via INTRAVENOUS

## 2014-07-19 MED ORDER — ONDANSETRON 8 MG/NS 50 ML IVPB
INTRAVENOUS | Status: AC
  Filled 2014-07-19: qty 8

## 2014-07-19 MED ORDER — DEXTROSE 5 % IV SOLN
80.0000 mg/m2 | Freq: Once | INTRAVENOUS | Status: AC
  Administered 2014-07-19: 126 mg via INTRAVENOUS
  Filled 2014-07-19: qty 21

## 2014-07-19 MED ORDER — ONDANSETRON 8 MG/50ML IVPB (CHCC)
8.0000 mg | Freq: Once | INTRAVENOUS | Status: AC
  Administered 2014-07-19: 8 mg via INTRAVENOUS

## 2014-07-19 MED ORDER — DIPHENHYDRAMINE HCL 50 MG/ML IJ SOLN
50.0000 mg | Freq: Once | INTRAMUSCULAR | Status: AC
  Administered 2014-07-19: 50 mg via INTRAVENOUS

## 2014-07-19 MED ORDER — DIPHENHYDRAMINE HCL 50 MG/ML IJ SOLN
INTRAMUSCULAR | Status: AC
  Filled 2014-07-19: qty 1

## 2014-07-19 MED ORDER — FAMOTIDINE IN NACL 20-0.9 MG/50ML-% IV SOLN
INTRAVENOUS | Status: AC
  Filled 2014-07-19: qty 50

## 2014-07-19 MED ORDER — FAMOTIDINE IN NACL 20-0.9 MG/50ML-% IV SOLN
20.0000 mg | Freq: Once | INTRAVENOUS | Status: AC
  Administered 2014-07-19: 20 mg via INTRAVENOUS

## 2014-07-19 MED ORDER — DEXAMETHASONE SODIUM PHOSPHATE 20 MG/5ML IJ SOLN
20.0000 mg | Freq: Once | INTRAMUSCULAR | Status: AC
  Administered 2014-07-19: 20 mg via INTRAVENOUS

## 2014-07-19 NOTE — Patient Instructions (Signed)
[facility] Discharge Instructions for Patients Receiving Chemotherapy  Today you received the following chemotherapy agents Taxol.  To help prevent nausea and vomiting after your treatment, we encourage you to take your nausea medication as directed.    If you develop nausea and vomiting that is not controlled by your nausea medication, call the clinic.   BELOW ARE SYMPTOMS THAT SHOULD BE REPORTED IMMEDIATELY:  *FEVER GREATER THAN 100.5 F  *CHILLS WITH OR WITHOUT FEVER  NAUSEA AND VOMITING THAT IS NOT CONTROLLED WITH YOUR NAUSEA MEDICATION  *UNUSUAL SHORTNESS OF BREATH  *UNUSUAL BRUISING OR BLEEDING  TENDERNESS IN MOUTH AND THROAT WITH OR WITHOUT PRESENCE OF ULCERS  *URINARY PROBLEMS  *BOWEL PROBLEMS  UNUSUAL RASH Items with * indicate a potential emergency and should be followed up as soon as possible.  Feel free to call the clinic you have any questions or concerns. The clinic phone number is [phone].    

## 2014-07-19 NOTE — Progress Notes (Signed)
Consult Note: Gyn-Onc  Annette Tucker 69 y.o. female  CC:  Chief Complaint  Patient presents with  . Endo ca    Follow up    HPI: Patient is initially seen in consultation at the request of Dr. Pamala Hurry. Patient is a 69 year old gravida 0 who began experiencing some low back pain and then spotting starting in August.. She had an exam which showed significant atrophy and hormone cream was prescribed. She then continued to have some spotting and discharge went to see Dr. Pamala Hurry. Ultrasound was performed that revealed a 4 x 3 x 2 centimeter uterus with a 4.8 milliliter endometrial stripe. There were 2 small lesions measuring 0.5 cm each consistent with polyps. She had been on hormone replacement therapy until 2012. She had been on it approximately 10 years. Pathology from her D&C revealed high-grade endometrial cancer with serous features. For this reason that she is referred to [place] today.  She states since the [facility] she's been feeling well. The low back pain is fairly chronic for her but that was slightly different which is what prompted her visit. She does have a family history of cancer. She has a maternal aunt with postmenopausal breast cancer. She's a paternal first cousin with postmenopausal breast cancer. She is up-to-date on her mammograms. Her last one was in October. She's due for colonoscopy in 2016. Her primary physician is Dr. Tedra Tucker.  06/05/14: Surgery: Total robotic hysterectomy bilateral salpingo-oophorectomy, bilateral pelvic and para-aortic lymph node dissection.   Operative findings: Normal uterus, cervix, adnexal and abdominal survey, No pathologically enlarged nodes identified.   Pathology:  Diagnosis 1. Lymph node, biopsy, right para aortic nodes - ONE LYMPH NODE, NEGATIVE FOR METASTATIC CARCINOMA (0/1). 2. Lymph node, biopsy, left para aortic node - THREE LYMPH NODES, NEGATIVE FOR METASTATIC CARCINOMA (0/3). 3. Lymph nodes, regional resection, right pelvic - FOUR  LYMPH NODES, NEGATIVE FOR METASTATIC CARCINOMA (0/4). 4. Lymph nodes, regional resection, left pelvic - FOUR LYMPH NODES, NEGATIVE FOR METASTATIC CARCINOMA (0/4). 5. Uterus +/- tubes/ovaries, neoplastic, with cervix - SUPERFICIALLY INVASIVE SEROUS CARCINOMA, 0.5 CM, CONFINED WITHIN THE INNER HALF OF THE MYOMETRIUM. (20%, no LVIS) - ADJACENT BENIGN ENDOMETRIAL POLYPS WITH NO EVIDENCE OF ATYPIA OR MALIGNANCY. - CERVIX: BENIGN SQUAMOUS MUCOSA AND ENDOCERVICAL MUCOSA, NO DYSPLASIA OR MALIGNANCY. - BILATERAL OVARIES: BENIGN OVARIAN TISSUE WITH ENDOMETRIOSIS AND ENDOSALPINGIOSIS, NO ATYPIA OR MALIGNANCY. - BILATERAL FALLOPIAN TUBES: BENIGN FALLOPIAN TUBAL TISSUE, NO EVIDENCE OF ATYPIA OR MALIGNANCY. - MYOMETRIUM: LEIOMYOMATA.  She comes in today for her postoperative check. She is a restarted her chemotherapy and had cycle #1 day #1 of dose dense paclitaxel and carboplatin on January 14. It was felt that she needed to be treated with dose dense therapy secondary to concerns of chemotherapy intolerance. She is scheduled to see Dr. Sondra Come for discussion of her radiation therapy on February 10. She states she did have some nausea she does not feel that she took her antibiotics as prescribed. She states with this next cycle she will do things a little bit differently. She has lost about a total of 10 pounds since we saw her initially on December 2. With the chemotherapy she only had some pain in her jaw that was relieved with a heating pad. She also had some abdominal pain that similarly was relieved with a heating pad. Interestingly with the steroids prior to chemotherapy she had some involuntary urinary leakage. It is not happened since that time. She is using magnesium at night as well as eating prunes  for her bowels. Her bowels are fairly regular. She denies any vaginal bleeding. She and I again reviewed her pathology today. We reviewed the rationale for both chemotherapy and radiation.  Current Meds:   Outpatient Encounter Prescriptions as of 07/19/2014  Medication Sig  . acetaminophen (TYLENOL) 500 MG tablet Take 500 mg by mouth once as needed for moderate pain.   Marland Kitchen aspirin 81 MG EC tablet Take 81 mg by mouth every evening. Stopped on 05/10/14 in preparation for surgery.  . Calcium Carbonate-Vitamin D (CALCIUM 600+D) 600-400 MG-UNIT per tablet Take 1 tablet by mouth 2 (two) times daily. @lunch  and in the evening  . cholecalciferol (VITAMIN D) 1000 UNITS tablet Take 1,000 Units by mouth daily.   . Coenzyme Q10 (CO Q-10) 100 MG CAPS Take 1 capsule by mouth every morning.   Marland Kitchen dexamethasone (DECADRON) 4 MG tablet Take 5 tabs with food (=20mg ) 12 hrs prior to chemo and 5 tabs with food(=20mg ) 6 hours prior to chemo, or as directed. (Patient not taking: Reported on 07/16/2014)  . fish oil-omega-3 fatty acids 1000 MG capsule Take 1 g by mouth every morning.   . Flaxseed, Linseed, (FLAX SEED OIL PO) Take 1,000 Units by mouth every evening.   Marland Kitchen ibuprofen (ADVIL,MOTRIN) 600 MG tablet Take 1 tablet (600 mg total) by mouth every 6 (six) hours as needed for mild pain.  Marland Kitchen loratadine (CLARITIN) 10 MG tablet Take 10 mg by mouth daily as needed for allergies.   Marland Kitchen LORazepam (ATIVAN) 0.5 MG tablet Place 1 tablet under the tongue or swallow every 6 hours as needed for nausea. Will make drowsy. Take 1 tablet night of first chemo whether or not any nausea. (Patient not taking: Reported on 07/16/2014)  . Magnesium 250 MG TABS Take 1 tablet by mouth every evening.   . Multiple Vitamins-Minerals (MULTIVITAMIN WITH MINERALS) tablet Take 1 tablet by mouth every morning.   . ondansetron (ZOFRAN) 8 MG tablet Take 1 tab every 8 hrs as needed for nausea. Take morning after chemo whether or not any nausea.  . polyethylene glycol (MIRALAX / GLYCOLAX) packet Take 17 g by mouth daily.  Marland Kitchen PROCTOZONE-HC 2.5 % rectal cream Place 1 application rectally as needed.   . ramipril (ALTACE) 5 MG capsule TAKE 1 CAPSULE (5 MG TOTAL) BY MOUTH  DAILY.  . simvastatin (ZOCOR) 20 MG tablet TAKE 1 TABLET (20 MG TOTAL) BY MOUTH AT BEDTIME.  . vitamin E 400 UNIT capsule Take 400 Units by mouth every morning.     Allergy:  Allergies  Allergen Reactions  . Latex Rash    Social Hx:   History   Social History  . Marital Status: Single    Spouse Name: N/A    Number of Children: N/A  . Years of Education: N/A   Occupational History  . Not on file.   Social History Main Topics  . Smoking status: Never Smoker   . Smokeless tobacco: Never Used  . Alcohol Use: Yes     Comment: rarely  . Drug Use: No  . Sexual Activity: Yes    Birth Control/ Protection: Post-menopausal   Other Topics Concern  . Not on file   Social History Narrative    Past Surgical Hx:  Past Surgical History  Procedure Laterality Date  . Tonsillectomy      age 39  . Tmj mandibular advancement    . Wisdom tooth extraction    . Breast surgery      left breast bx -  benign  . Colonoscopy  2006  . Dilatation & curettage/hysteroscopy with trueclear N/A 05/17/2014    Procedure: HYSTEROSCOPY WITH POLYPECTOMY;  Surgeon: Claiborne Billings A. Pamala Hurry, MD;  Location: [facility];  Service: Gynecology;  Laterality: N/A;  . Robotic assisted total hysterectomy with bilateral salpingo oopherectomy N/A 06/05/2014    Procedure: ROBOTIC ASSISTED TOTAL HYSTERECTOMY WITH BILATERAL SALPINGO OOPHORECTOMY;  Surgeon: Imagene Gurney A. Alycia Rossetti, MD;  Location: [facility];  Service: Gynecology;  Laterality: N/A;  . Lymph node dissection N/A 06/05/2014    Procedure: LYMPH NODE DISSECTION;  Surgeon: Imagene Gurney A. Alycia Rossetti, MD;  Location: [facility];  Service: Gynecology;  Laterality: N/A;    Past Medical Hx:  Past Medical History  Diagnosis Date  . Hyperlipidemia   . Hypertension   . Heart murmur     as child, no problems   . Seasonal allergies   . Cancer     ENDOMETRIAL CANCER    Oncology Hx:    Endometrial ca   05/17/2014 Initial Diagnosis Endometrial ca   06/05/2014 Surgery TRH/BSO and staging. IAUPSC  0/12 nodes, no LVSI   07/12/2014 -  Chemotherapy paclitaxel and carboplatin    Family Hx:  Family History  Problem Relation Age of Onset  . Mental retardation Mother   . Stroke Mother   . Kidney disease Mother     Vitals:  Blood pressure 122/75, pulse 114, temperature 98.2 F (36.8 C), temperature source Oral, resp. rate 16, height 5\' 3"  (1.6 m), weight 113 lb 14.4 oz (51.665 kg).  Physical Exam: Well-nourished well-developed female in no acute distress.  Abdomen: Soft, nontender, nondistended. There are no palpable masses or hepatosplenomegaly. Well-healed surgical incisions with no evidence of any incisional hernias.  Extremities: No edema.  Pelvic: Normal female genitalia. The vagina is markedly atrophic. The vaginal cuff is visualized. The sutures have dissolved. There is no separation. Bimanual examination reveals no tenderness, fluctuance or masses.  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 Her preoperative staging was negative. Her surgical staging was similarly negative. She's been treated adjuvantly with dose dense paclitaxel and carboplatin. She will be seeing radiation oncology later in February for consideration of high-dose rate radiation. In addition to her postoperative check, we spent approximately 15 minutes face to face time discussing her symptoms from treatment as well as plans to minimize her symptoms moving forward. We also discussed general rationale and procedures for her radiation therapy. She knows that we'll see her when she's completed her treatment but that I'll be happy to see her at any point. She wanted to know when she can start walking on her treadmill and I believe that she can start doing that at this time.  Demetrice Amstutz A., MD 07/19/2014, 10:11 AM

## 2014-07-19 NOTE — Patient Instructions (Signed)
Dr. Marko Plume will arrange for you to follow-up with [place] once you've completed your treatment. Please call us with any issues.

## 2014-07-22 ENCOUNTER — Other Ambulatory Visit: Payer: Self-pay | Admitting: Oncology

## 2014-07-24 ENCOUNTER — Telehealth: Payer: Self-pay | Admitting: *Deleted

## 2014-07-24 NOTE — Telephone Encounter (Signed)
Patient called reporting she has five pills left of the dexamethasone and wanted to confirm when to take these.  Appointment is at 1000 am on 1:28-2016.  Instructed to take at 1000 pm on 07-25-2013.  Needs refill bur says she will ask Dr. Marko Plume on Thursday for refill on dexamethasone.

## 2014-07-25 ENCOUNTER — Other Ambulatory Visit: Payer: Self-pay | Admitting: Oncology

## 2014-07-25 ENCOUNTER — Other Ambulatory Visit: Payer: Self-pay

## 2014-07-25 DIAGNOSIS — C541 Malignant neoplasm of endometrium: Secondary | ICD-10-CM

## 2014-07-25 MED ORDER — DEXAMETHASONE 4 MG PO TABS
ORAL_TABLET | ORAL | Status: DC
Start: 2014-07-25 — End: 2014-08-09

## 2014-07-25 NOTE — Telephone Encounter (Signed)
Patient Demographics     Patient Name Sex DOB SSN Address Phone    Annette Tucker, Annette Tucker Female September 10, 1945 [id number] [place] [address] [phone] (Home) *Preferred* [phone] (Mobile)      Message  Received: Norman Herrlich, MD  Baruch Merl, RN           Will need refill on decadron for treatments starting 2-4. Five tabs with food 12 hrs prior to taxol #15 for 3 weeks with 4 RF  thanks

## 2014-07-26 ENCOUNTER — Ambulatory Visit: Payer: Medicare HMO

## 2014-07-26 ENCOUNTER — Encounter: Payer: Self-pay | Admitting: Oncology

## 2014-07-26 ENCOUNTER — Telehealth: Payer: Self-pay | Admitting: Oncology

## 2014-07-26 ENCOUNTER — Other Ambulatory Visit (HOSPITAL_BASED_OUTPATIENT_CLINIC_OR_DEPARTMENT_OTHER): Payer: Medicare HMO

## 2014-07-26 ENCOUNTER — Ambulatory Visit (HOSPITAL_BASED_OUTPATIENT_CLINIC_OR_DEPARTMENT_OTHER): Payer: Medicare HMO | Admitting: Oncology

## 2014-07-26 VITALS — BP 155/80 | HR 99 | Temp 97.7°F | Resp 18 | Ht 63.0 in | Wt 115.2 lb

## 2014-07-26 DIAGNOSIS — T451X5A Adverse effect of antineoplastic and immunosuppressive drugs, initial encounter: Secondary | ICD-10-CM

## 2014-07-26 DIAGNOSIS — N319 Neuromuscular dysfunction of bladder, unspecified: Secondary | ICD-10-CM

## 2014-07-26 DIAGNOSIS — D701 Agranulocytosis secondary to cancer chemotherapy: Secondary | ICD-10-CM

## 2014-07-26 DIAGNOSIS — L259 Unspecified contact dermatitis, unspecified cause: Secondary | ICD-10-CM

## 2014-07-26 DIAGNOSIS — C541 Malignant neoplasm of endometrium: Secondary | ICD-10-CM

## 2014-07-26 DIAGNOSIS — Z91040 Latex allergy status: Secondary | ICD-10-CM

## 2014-07-26 LAB — CBC WITH DIFFERENTIAL/PLATELET
BASO%: 0 % (ref 0.0–2.0)
Basophils Absolute: 0 10*3/uL (ref 0.0–0.1)
EOS%: 0 % (ref 0.0–7.0)
Eosinophils Absolute: 0 10*3/uL (ref 0.0–0.5)
HCT: 37.4 % (ref 34.8–46.6)
HGB: 12.2 g/dL (ref 11.6–15.9)
LYMPH%: 19.5 % (ref 14.0–49.7)
MCH: 30 pg (ref 25.1–34.0)
MCHC: 32.6 g/dL (ref 31.5–36.0)
MCV: 91.9 fL (ref 79.5–101.0)
MONO#: 0 10*3/uL — ABNORMAL LOW (ref 0.1–0.9)
MONO%: 3.3 % (ref 0.0–14.0)
NEUT#: 1 10*3/uL — ABNORMAL LOW (ref 1.5–6.5)
NEUT%: 77.2 % — ABNORMAL HIGH (ref 38.4–76.8)
Platelets: 224 10*3/uL (ref 145–400)
RBC: 4.07 10*6/uL (ref 3.70–5.45)
RDW: 13 % (ref 11.2–14.5)
WBC: 1.2 10*3/uL — ABNORMAL LOW (ref 3.9–10.3)
lymph#: 0.2 10*3/uL — ABNORMAL LOW (ref 0.9–3.3)

## 2014-07-26 LAB — COMPREHENSIVE METABOLIC PANEL (CC13)
ALT: 16 U/L (ref 0–55)
AST: 15 U/L (ref 5–34)
Albumin: 4.1 g/dL (ref 3.5–5.0)
Alkaline Phosphatase: 67 U/L (ref 40–150)
Anion Gap: 15 mEq/L — ABNORMAL HIGH (ref 3–11)
BUN: 13.7 mg/dL (ref 7.0–26.0)
CO2: 20 mEq/L — ABNORMAL LOW (ref 22–29)
Calcium: 9.6 mg/dL (ref 8.4–10.4)
Chloride: 105 mEq/L (ref 98–109)
Creatinine: 0.8 mg/dL (ref 0.6–1.1)
EGFR: 78 mL/min/{1.73_m2} — ABNORMAL LOW (ref >90)
Glucose: 196 mg/dl — ABNORMAL HIGH (ref 70–140)
Potassium: 4 mEq/L (ref 3.5–5.1)
Sodium: 140 mEq/L (ref 136–145)
Total Bilirubin: 0.37 mg/dL (ref 0.20–1.20)
Total Protein: 7.3 g/dL (ref 6.4–8.3)

## 2014-07-26 NOTE — Patient Instructions (Signed)
Benadryl (diphenhydramine) 25 mg every 4-6 hours as needed for itching/ rash. This may make you drowsy, do not drive.  Your good infection fighting cells are 1.0, just above neutropenic range.  Call if temperature >=100.5 thru weekend, [phone]. Stay away from anyone who might be sick, stay out of stores/ restaurants.

## 2014-07-26 NOTE — Telephone Encounter (Signed)
per pof to sch pt appt-pt in trmt room to get updated copy of sch b4 leaving

## 2014-07-26 NOTE — Telephone Encounter (Signed)
per pof to sch pt appt-gave pt copy of sch °

## 2014-07-26 NOTE — Progress Notes (Signed)
OFFICE PROGRESS NOTE   07-26-14  Physicians:P.Alycia Rossetti, M.J.Baxley, K.Fogleman, H.Grueber, Juanita Craver, [facility]) Consultation Dr Sondra Come pending 08-08-14.  INTERVAL HISTORY:  Patient is seen, alone for visit, in continuing attention to adjuvant chemotherapy in process for IA serous endometrial carcinoma, due day 15 cycle 1 today, however ANC is just 1.0 so will cancel treatment today and resume with day 1 cycle 2 next week if counts are adequate. Will add gCSF as granix/ neupogen after each treatment beginning cycle 2. Note unable to get authorization from her insurance for any gCSF today. She saw Dr Alycia Rossetti on 07-19-14 and will see her again after chemo completes. She is to see Dr Sondra Come in consultation 08-08-14.  Patient has had no fever and no symptoms of infection. She did take premedication decadron last pm. She has had rash on hands, perineum/ perirectal area and behind knees over last few days, with latex allergy and sensitive skin otherwise; rash is not pruritic, is some better since steroids last pm, has not tried benadryl. Rash seems likely contact related, as she used "non latex" gloves for washing dishes, has tried some different soap, lotions and dish detergent, and has used adhesive light perineal pad for slight urinary incontinence and has tried some OTC feminine wipes. She had some constipation x 3 days, with good BM this morning. She is eating well, did use prn antiemetics on a couple of occasions with benefit. She is voiding every 2-3 hours while awake, which is a little better than previously. No peripheral neuropathy. Did sleep last pm. She does not report any difficulty with peripheral IV access.   No PAC Flu vaccine done  ONCOLOGIC HISTORY Patient has had chronic low back pain which continued 01-2014, with new vaginal spotting then. She saw Dr Pamala Hurry, with exam finding very atrophic vagina and hemorrhoids. With continued spotting whe had [place] which showed 4x3x2 cm uterus with  4.8 mm endometrial stripe and 2 small lesions felt to be polyps. D and C 05-17-14 ([id number] found high grade endometrial carcinoma with serous features. CT AP and CXR 05-31-14 showed no evidence of metastatic disease. She was seen by Dr Alycia Rossetti and taken to robotic assisted hysterectomy BSO pelvic and paraaortic nodes 06-05-14. Pathology [phone]) had superficially invasive serous carcinoma, stage IA. Post operative course was uncomplicated. Patient discussed pathology findings and recommendations with Dr Alycia Rossetti by phone, and is to see her for post operative follow up visit on 07-19-14. Dr Alycia Rossetti recommended adjuvant taxol carboplatin + vaginal brachytherapy. First carbo taxol given 07-12-14, dose dense regimen chosen as patient very anxious about possible chemotherapy side effects.    Review of systems as above, also: No new or different pain. No bleeding. No LE swelling.  Remainder of 10 point Review of Systems negative.  Objective:  Vital signs in last 24 hours:  BP 155/80 mmHg  Pulse 99  Temp(Src) 97.7 F (36.5 C) (Oral)  Resp 18  Ht '5\' 3"'  (1.6 m)  Wt 115 lb 3.2 oz (52.254 kg)  BMI 20.41 kg/m2  SpO2 99% Weight is up 2 lbs. Very talkative and some difficulty keeping conversation on track with steroids. Looks comfortable. Alert, oriented and appropriate. Ambulatory without difficulty.  Partial alopecia  HEENT:PERRL, sclerae not icteric. Oral mucosa moist without lesions, posterior pharynx clear.  Neck supple. No JVD.  Lymphatics:no cervical,supraclavicular or inguinal adenopathy Resp: clear to auscultation bilaterally and normal percussion bilaterally Cardio: regular rate and rhythm. No gallop. GI: soft, nontender, not distended, no mass or organomegaly. Normally active bowel sounds.  Surgical incisions not remarkable. Musculoskeletal/ Extremities: without pitting edema, cords, tenderness Neuro: no peripheral neuropathy. Otherwise nonfocal. PSYCH appropriate mood and affect  Skin   Erythematous rash dorsum of hands and fingers bilaterally, and perirectal area to vulva corresponding to peripad, no vesicles, slight erythema behind knees bilaterally,none in antecubitals,  otherwise without ecchymosis, petechiae   Lab Results:  Results for orders placed or performed in visit on 07/26/14  CBC with Differential  Result Value Ref Range   WBC 1.2 (L) 3.9 - 10.3 10e3/uL   NEUT# 1.0 (L) 1.5 - 6.5 10e3/uL   HGB 12.2 11.6 - 15.9 g/dL   HCT 37.4 34.8 - 46.6 %   Platelets 224 145 - 400 10e3/uL   MCV 91.9 79.5 - 101.0 fL   MCH 30.0 25.1 - 34.0 pg   MCHC 32.6 31.5 - 36.0 g/dL   RBC 4.07 3.70 - 5.45 10e6/uL   RDW 13.0 11.2 - 14.5 %   lymph# 0.2 (L) 0.9 - 3.3 10e3/uL   MONO# 0.0 (L) 0.1 - 0.9 10e3/uL   Eosinophils Absolute 0.0 0.0 - 0.5 10e3/uL   Basophils Absolute 0.0 0.0 - 0.1 10e3/uL   NEUT% 77.2 (H) 38.4 - 76.8 %   LYMPH% 19.5 14.0 - 49.7 %   MONO% 3.3 0.0 - 14.0 %   EOS% 0.0 0.0 - 7.0 %   BASO% 0.0 0.0 - 2.0 %  Comprehensive metabolic panel (Cmet) - [facility]  Result Value Ref Range   Sodium 140 136 - 145 mEq/L   Potassium 4.0 3.5 - 5.1 mEq/L   Chloride 105 98 - 109 mEq/L   CO2 20 (L) 22 - 29 mEq/L   Glucose 196 (H) 70 - 140 mg/dl   BUN 13.7 7.0 - 26.0 mg/dL   Creatinine 0.8 0.6 - 1.1 mg/dL   Total Bilirubin 0.37 0.20 - 1.20 mg/dL   Alkaline Phosphatase 67 40 - 150 U/L   AST 15 5 - 34 U/L   ALT 16 0 - 55 U/L   Total Protein 7.3 6.4 - 8.3 g/dL   Albumin 4.1 3.5 - 5.0 g/dL   Calcium 9.6 8.4 - 10.4 mg/dL   Anion Gap 15 (H) 3 - 11 mEq/L   EGFR 78 (L) >90 ml/min/1.73 m2    Note WBC 1.2 and ANC 1.0 even with premed decadron last pm.  Studies/Results:  No results found.  Medications: I have reviewed the patient's current medications. Fine to use OTC benadryl 25 mg q 6 hr prn itching, will make drowsy.  DISCUSSION: Rash discussed, likely contact. Recommended she use only soap and lotion that she knows she has been able to tolerate in past, stop using light days pad  and dish washing gloves. She does know Dr Hedy Jacob if needed. Discussed low WBC and ANC just at neutropenic range; she is aware that her insurance may take 3 days to authorize gCSF. Hopefully counts will improve in near future without gCSF today; she has been instructed in neutropenic precautions and given mask now.   Note patient kept at office as managed care attempted authorization of gCSF. MD discussed directly with managed care thru this.  Assessment/Plan:  1.IA high grade serous endometrial carcinoma: post robotic assisted hysterectomy with BSO and bilateral pelvic and para aortic node evaluation 06-05-2014. Day 1 cycle 1 dose dense carbo taxol given 07-12-14, ANC too low today for day 15 cycle 1 (omit day 15 cycle 1 and resume treatment with day 1 cycle 2 next week).  Will add gCSF if  insurance allows, in preference to dose reduction at this point.  Consultation planned with Dr Sondra Come for consideration of vaginal brachytherapy. Neutropenic precautions. 2.probable contact rashes: plan as above. Latex allergy clearly noted in EMR. 3.Chemo neutropenia: ANC just 1.0 on steroids 4. Po intake better this week 5.slight hematuria and decreased bladder sensation: better with regular, frequent emptying of bladder,  urine culture negative 07-16-14. 6.left renal cyst on CT, without concerning features 7.post benign biopsy left breast 1990s, up to date on mammograms  8.benign skin lesions, followed yearly by Dr Hedy Jacob 9. Up to date colonoscopy 10.atherosclerosis by CT 11.lumbar spondylosis with some chronic back discomfort 12.hypertension, elevated lipids followed by PCP 13. flu vaccine done All questions answered and patient is in agreement with plans and recommendations as above. Chemo orders adjusted. Have added granix 300 mg after each chemo beginning at least with cycle 2. This dose can be 24 to 48 hours after chemo, depending on insurance requirements. Time spent 30 min including >50% counseling  and coordination of care.  Briston Lax P, MD   07/26/2014, 10:04 AM

## 2014-07-27 ENCOUNTER — Ambulatory Visit (HOSPITAL_BASED_OUTPATIENT_CLINIC_OR_DEPARTMENT_OTHER): Payer: Medicare HMO

## 2014-07-27 ENCOUNTER — Telehealth: Payer: Self-pay

## 2014-07-27 DIAGNOSIS — Z91040 Latex allergy status: Secondary | ICD-10-CM | POA: Insufficient documentation

## 2014-07-27 DIAGNOSIS — D701 Agranulocytosis secondary to cancer chemotherapy: Secondary | ICD-10-CM | POA: Insufficient documentation

## 2014-07-27 DIAGNOSIS — C541 Malignant neoplasm of endometrium: Secondary | ICD-10-CM

## 2014-07-27 DIAGNOSIS — C55 Malignant neoplasm of uterus, part unspecified: Secondary | ICD-10-CM

## 2014-07-27 DIAGNOSIS — N319 Neuromuscular dysfunction of bladder, unspecified: Secondary | ICD-10-CM | POA: Insufficient documentation

## 2014-07-27 DIAGNOSIS — T451X5A Adverse effect of antineoplastic and immunosuppressive drugs, initial encounter: Secondary | ICD-10-CM

## 2014-07-27 DIAGNOSIS — L259 Unspecified contact dermatitis, unspecified cause: Secondary | ICD-10-CM | POA: Insufficient documentation

## 2014-07-27 MED ORDER — TBO-FILGRASTIM 300 MCG/0.5ML ~~LOC~~ SOSY
300.0000 ug | PREFILLED_SYRINGE | Freq: Once | SUBCUTANEOUS | Status: AC
  Administered 2014-07-27: 300 ug via SUBCUTANEOUS
  Filled 2014-07-27: qty 0.5

## 2014-07-27 NOTE — Patient Instructions (Signed)

## 2014-07-27 NOTE — Telephone Encounter (Signed)
Ms. Bouska is doing fine. Afebrile-97.6. The benadryl dosing that Dr. Marko Plume directed her to take is  helping with rash. Told her that Dr. Marko Plume would like her to receive the neupogen today.  Ms. Weldin scheduled for 1430 for injection.  Ms. Langbehn verbalized understanding.

## 2014-07-27 NOTE — Telephone Encounter (Signed)
Left message for Ms. Helvie to call back to Dr. Mariana Kaufman nurse to see how she is doing today as her [place] yesterday was 1.0.   Neupogen not given as it needed prior authorization.   Spoke with Gaspar Bidding in managed care and the authorization was received for Neupogen from her insurance company.

## 2014-08-02 ENCOUNTER — Ambulatory Visit (HOSPITAL_BASED_OUTPATIENT_CLINIC_OR_DEPARTMENT_OTHER): Payer: Medicare HMO

## 2014-08-02 ENCOUNTER — Other Ambulatory Visit (HOSPITAL_BASED_OUTPATIENT_CLINIC_OR_DEPARTMENT_OTHER): Payer: Medicare HMO

## 2014-08-02 ENCOUNTER — Other Ambulatory Visit: Payer: Self-pay | Admitting: Oncology

## 2014-08-02 DIAGNOSIS — C541 Malignant neoplasm of endometrium: Secondary | ICD-10-CM

## 2014-08-02 DIAGNOSIS — Z5111 Encounter for antineoplastic chemotherapy: Secondary | ICD-10-CM

## 2014-08-02 LAB — CBC WITH DIFFERENTIAL/PLATELET
BASO%: 0.2 % (ref 0.0–2.0)
Basophils Absolute: 0 10*3/uL (ref 0.0–0.1)
EOS%: 0 % (ref 0.0–7.0)
Eosinophils Absolute: 0 10*3/uL (ref 0.0–0.5)
HCT: 37.7 % (ref 34.8–46.6)
HGB: 12.8 g/dL (ref 11.6–15.9)
LYMPH%: 6.7 % — ABNORMAL LOW (ref 14.0–49.7)
MCH: 30.8 pg (ref 25.1–34.0)
MCHC: 34 g/dL (ref 31.5–36.0)
MCV: 90.6 fL (ref 79.5–101.0)
MONO#: 0.5 10*3/uL (ref 0.1–0.9)
MONO%: 5.5 % (ref 0.0–14.0)
NEUT#: 7.5 10*3/uL — ABNORMAL HIGH (ref 1.5–6.5)
NEUT%: 87.6 % — ABNORMAL HIGH (ref 38.4–76.8)
Platelets: 236 10*3/uL (ref 145–400)
RBC: 4.16 10*6/uL (ref 3.70–5.45)
RDW: 14.1 % (ref 11.2–14.5)
WBC: 8.6 10*3/uL (ref 3.9–10.3)
lymph#: 0.6 10*3/uL — ABNORMAL LOW (ref 0.9–3.3)
nRBC: 0 % (ref 0–0)

## 2014-08-02 MED ORDER — FAMOTIDINE IN NACL 20-0.9 MG/50ML-% IV SOLN
20.0000 mg | Freq: Once | INTRAVENOUS | Status: AC
  Administered 2014-08-02: 20 mg via INTRAVENOUS

## 2014-08-02 MED ORDER — SODIUM CHLORIDE 0.9 % IV SOLN
Freq: Once | INTRAVENOUS | Status: AC
  Administered 2014-08-02: 13:00:00 via INTRAVENOUS

## 2014-08-02 MED ORDER — FAMOTIDINE IN NACL 20-0.9 MG/50ML-% IV SOLN
INTRAVENOUS | Status: AC
  Filled 2014-08-02: qty 50

## 2014-08-02 MED ORDER — ONDANSETRON 16 MG/50ML IVPB (CHCC)
INTRAVENOUS | Status: AC
  Filled 2014-08-02: qty 16

## 2014-08-02 MED ORDER — DIPHENHYDRAMINE HCL 50 MG/ML IJ SOLN
INTRAMUSCULAR | Status: AC
  Filled 2014-08-02: qty 1

## 2014-08-02 MED ORDER — ONDANSETRON 16 MG/50ML IVPB (CHCC)
16.0000 mg | Freq: Once | INTRAVENOUS | Status: AC
  Administered 2014-08-02: 16 mg via INTRAVENOUS

## 2014-08-02 MED ORDER — DEXAMETHASONE SODIUM PHOSPHATE 20 MG/5ML IJ SOLN
20.0000 mg | Freq: Once | INTRAMUSCULAR | Status: AC
  Administered 2014-08-02: 20 mg via INTRAVENOUS

## 2014-08-02 MED ORDER — SODIUM CHLORIDE 0.9 % IV SOLN
430.0000 mg | Freq: Once | INTRAVENOUS | Status: AC
  Administered 2014-08-02: 430 mg via INTRAVENOUS
  Filled 2014-08-02: qty 43

## 2014-08-02 MED ORDER — DIPHENHYDRAMINE HCL 50 MG/ML IJ SOLN
50.0000 mg | Freq: Once | INTRAMUSCULAR | Status: AC
  Administered 2014-08-02: 50 mg via INTRAVENOUS

## 2014-08-02 MED ORDER — DEXAMETHASONE SODIUM PHOSPHATE 20 MG/5ML IJ SOLN
INTRAMUSCULAR | Status: AC
  Filled 2014-08-02: qty 5

## 2014-08-02 MED ORDER — PACLITAXEL CHEMO INJECTION 300 MG/50ML
80.0000 mg/m2 | Freq: Once | INTRAVENOUS | Status: AC
  Administered 2014-08-02: 126 mg via INTRAVENOUS
  Filled 2014-08-02: qty 21

## 2014-08-02 NOTE — Patient Instructions (Signed)
Cancer Center Discharge Instructions for Patients Receiving Chemotherapy  Today you received the following chemotherapy agents Taxol/Carboplatin  To help prevent nausea and vomiting after your treatment, we encourage you to take your nausea medication as directed.   If you develop nausea and vomiting that is not controlled by your nausea medication, call the clinic.   BELOW ARE SYMPTOMS THAT SHOULD BE REPORTED IMMEDIATELY:  *FEVER GREATER THAN 100.5 F  *CHILLS WITH OR WITHOUT FEVER  NAUSEA AND VOMITING THAT IS NOT CONTROLLED WITH YOUR NAUSEA MEDICATION  *UNUSUAL SHORTNESS OF BREATH  *UNUSUAL BRUISING OR BLEEDING  TENDERNESS IN MOUTH AND THROAT WITH OR WITHOUT PRESENCE OF ULCERS  *URINARY PROBLEMS  *BOWEL PROBLEMS  UNUSUAL RASH Items with * indicate a potential emergency and should be followed up as soon as possible.  Feel free to call the clinic you have any questions or concerns. The clinic phone number is [phone].    

## 2014-08-03 ENCOUNTER — Ambulatory Visit (HOSPITAL_BASED_OUTPATIENT_CLINIC_OR_DEPARTMENT_OTHER): Payer: Medicare HMO

## 2014-08-03 DIAGNOSIS — Z5189 Encounter for other specified aftercare: Secondary | ICD-10-CM

## 2014-08-03 DIAGNOSIS — C541 Malignant neoplasm of endometrium: Secondary | ICD-10-CM

## 2014-08-03 DIAGNOSIS — C55 Malignant neoplasm of uterus, part unspecified: Secondary | ICD-10-CM

## 2014-08-03 MED ORDER — TBO-FILGRASTIM 300 MCG/0.5ML ~~LOC~~ SOSY
300.0000 ug | PREFILLED_SYRINGE | Freq: Once | SUBCUTANEOUS | Status: AC
  Administered 2014-08-03: 300 ug via SUBCUTANEOUS
  Filled 2014-08-03: qty 0.5

## 2014-08-03 NOTE — Progress Notes (Signed)
GYN Location of Tumor / Histology: IA high grade serous endometrial carcinoma   Annette Tucker presented with some low back pain and sporadic vaginal discharge/spotting starting in August.  Biopsies revealed:   05/17/14 Diagnosis Endometrial polyp - FRAGMENTS OF HIGH GRADE ENDOMETRIAL CARCINOMA WITH SEROUS FEATURES. - A FRAGMENT OF ENDOMETRIAL POLYP.  06/05/14 Diagnosis 1. Lymph node, biopsy, right para aortic nodes - ONE LYMPH NODE, NEGATIVE FOR METASTATIC CARCINOMA (0/1). 2. Lymph node, biopsy, left para aortic node - THREE LYMPH NODES, NEGATIVE FOR METASTATIC CARCINOMA (0/3). 3. Lymph nodes, regional resection, right pelvic - FOUR LYMPH NODES, NEGATIVE FOR METASTATIC CARCINOMA (0/4). 4. Lymph nodes, regional resection, left pelvic - FOUR LYMPH NODES, NEGATIVE FOR METASTATIC CARCINOMA (0/4). 5. Uterus +/- tubes/ovaries, neoplastic, with cervix - SUPERFICIALLY INVASIVE SEROUS CARCINOMA, 0.5 CM, CONFINED WITHIN THE INNER HALF OF THE MYOMETRIUM. - ADJACENT BENIGN ENDOMETRIAL POLYPS WITH NO EVIDENCE OF ATYPIA OR MALIGNANCY. - CERVIX: BENIGN SQUAMOUS MUCOSA AND ENDOCERVICAL MUCOSA, NO DYSPLASIA OR MALIGNANCY. - BILATERAL OVARIES: BENIGN OVARIAN TISSUE WITH ENDOMETRIOSIS AND ENDOSALPINGIOSIS, NO ATYPIA OR MALIGNANCY. - BILATERAL FALLOPIAN TUBES: BENIGN FALLOPIAN TUBAL TISSUE, NO EVIDENCE OF ATYPIA OR MALIGNANCY. - MYOMETRIUM: LEIOMYOMATA  Past/Anticipated interventions by Gyn/Onc surgery, if any: 06/05/14 - Procedure: ROBOTIC ASSISTED TOTAL HYSTERECTOMY WITH BILATERAL SALPINGO OOPHORECTOMY;  Surgeon: Imagene Gurney A. Alycia Rossetti, MD;  Location: [facility];  Service: Gynecology;  Procedure: LYMPH NODE DISSECTION;  Surgeon: Lucita Lora. Alycia Rossetti, MD;  Location: [facility];  Service: Gynecology;  Laterality: N/A;;   Past/Anticipated interventions by medical oncology, if any: adjuvant taxol carboplatin with Day 1 cycle 1 dose dense carbo taxol given 07-12-14, ANC too low today for day 15 cycle 1 (omit day 15 cycle 1 and  resume treatment with day 1 cycle 2 next week).  Had second cycle 08/02/14.  She will have 6 cycles total.  Weight changes, if any: lost 10 lbs after surgery.  Had gained 3 lbs back.  Bowel/Bladder complaints, if any:  Has constipation and is taking a stool softener.  Reports urinary frequency with dark urine.    Nausea/Vomiting, if any: no  Pain issues, if any:  No  OB Gyn: 15 years at menarche, does not have any children, used BCP for a short time, used hormone replacement for a short time and premarin cream  SAFETY ISSUES:  Prior radiation? no  Pacemaker/ICD? no  Possible current pregnancy? no  Is the patient on methotrexate? no  Current Complaints / other details:  Patient reports that she is doing well with chemotherapy except for taste bud changes.  BP 115/70 mmHg  Pulse 94  Temp(Src) 98.8 F (37.1 C) (Oral)  Resp 12  Ht 5\' 3"  (1.6 m)  Wt 117 lb 4.8 oz (53.207 kg)  BMI 20.78 kg/m2  SpO2 100%

## 2014-08-05 ENCOUNTER — Other Ambulatory Visit: Payer: Self-pay | Admitting: Oncology

## 2014-08-08 ENCOUNTER — Encounter: Payer: Self-pay | Admitting: Radiation Oncology

## 2014-08-08 ENCOUNTER — Ambulatory Visit
Admission: RE | Admit: 2014-08-08 | Discharge: 2014-08-08 | Disposition: A | Discharge status: Home / Self Care | Payer: Medicare HMO | Source: Ambulatory Visit | Attending: Radiation Oncology | Admitting: Radiation Oncology

## 2014-08-08 ENCOUNTER — Other Ambulatory Visit: Payer: Self-pay | Admitting: *Deleted

## 2014-08-08 VITALS — BP 115/70 | HR 94 | Temp 98.8°F | Resp 12 | Ht 63.0 in | Wt 117.3 lb

## 2014-08-08 DIAGNOSIS — I10 Essential (primary) hypertension: Secondary | ICD-10-CM | POA: Insufficient documentation

## 2014-08-08 DIAGNOSIS — Z90710 Acquired absence of both cervix and uterus: Secondary | ICD-10-CM | POA: Insufficient documentation

## 2014-08-08 DIAGNOSIS — C55 Malignant neoplasm of uterus, part unspecified: Secondary | ICD-10-CM

## 2014-08-08 DIAGNOSIS — C541 Malignant neoplasm of endometrium: Secondary | ICD-10-CM

## 2014-08-08 DIAGNOSIS — Z510 Encounter for antineoplastic radiation therapy: Secondary | ICD-10-CM | POA: Diagnosis not present

## 2014-08-08 DIAGNOSIS — E785 Hyperlipidemia, unspecified: Secondary | ICD-10-CM | POA: Diagnosis not present

## 2014-08-08 DIAGNOSIS — Z90722 Acquired absence of ovaries, bilateral: Secondary | ICD-10-CM | POA: Diagnosis not present

## 2014-08-08 NOTE — Progress Notes (Signed)
Please see the Nurse Progress Note in the MD Initial Consult Encounter for this patient. 

## 2014-08-08 NOTE — Progress Notes (Signed)
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 female who is seen out courtesy of Dr. Marko Plume for an opinion concerning radiation therapy as part of management of the patient's early stage but high-grade endometrial cancer. Last year the patient presented with  new onset vaginal spotting. She was seen by Dr. Pamala Hurry and the patient proceeded to undergo endometrial biopsy which revealed high-grade endometrial carcinoma with serous features. CT scan of the abdomen and pelvis as well as chest x-ray showed no evidence of metastatic disease. On 06/05/2014 the patient was taken to the operating room by Dr Alycia Rossetti at which time she underwent a robotic assisted hysterectomy, BSO, pelvic and periaortic node dissection. Pathology from this surgery revealed superficially invasive serous carcinoma 0.5 cm, confined to the inner half of the myometrium. There was no evidence of metastatic spread to pelvic or periaortic lymph nodes or to the ovaries or cervix. Depth of myometrial invasion was 0.2 cm where the myometrial thickness was 1 cm. The patient has done well since her surgery. She was seen by medical oncology who recommended adjuvant chemotherapy. The patient has initiated her chemotherapy and is in the midst of her second cycle of chemotherapy. Radiation therapy is been consulted for consideration for adjuvant treatment.  PREVIOUS RADIATION THERAPY: No  PAST MEDICAL HISTORY:  has a past medical history of Hyperlipidemia; Hypertension; Heart murmur; Seasonal allergies; and Cancer.    PAST SURGICAL HISTORY: Past Surgical History  Procedure Laterality Date  .  Tonsillectomy      age 77  . Tmj mandibular advancement    . Wisdom tooth extraction    . Breast surgery      left breast bx - benign  . Colonoscopy  2006  . Dilatation & curettage/hysteroscopy with trueclear N/A 05/17/2014    Procedure: HYSTEROSCOPY WITH POLYPECTOMY;  Surgeon: Claiborne Billings A. Pamala Hurry, MD;  Location: [facility];  Service: Gynecology;  Laterality: N/A;  . Robotic assisted total hysterectomy with bilateral salpingo oopherectomy N/A 06/05/2014    Procedure: ROBOTIC ASSISTED TOTAL HYSTERECTOMY WITH BILATERAL SALPINGO OOPHORECTOMY;  Surgeon: Imagene Gurney A. Alycia Rossetti, MD;  Location: [facility];  Service: Gynecology;  Laterality: N/A;  . Lymph node dissection N/A 06/05/2014    Procedure: LYMPH NODE DISSECTION;  Surgeon: Imagene Gurney A. Alycia Rossetti, MD;  Location: [facility];  Service: Gynecology;  Laterality: N/A;    FAMILY HISTORY: family history includes Kidney disease in her mother; Mental retardation in her mother; Stroke in her mother.  SOCIAL HISTORY:  reports that she has never smoked. She has never used smokeless tobacco. She reports that she drinks alcohol. She reports that she does not use illicit drugs.  ALLERGIES: Latex  MEDICATIONS:  Current Outpatient Prescriptions  Medication Sig Dispense Refill  . acetaminophen (TYLENOL) 500 MG tablet Take 500 mg by mouth once as needed for moderate pain.     Marland Kitchen aspirin 81 MG EC tablet Take 81 mg by mouth every evening. Stopped on 05/10/14 in preparation for surgery.    . Calcium Carbonate-Vitamin D (CALCIUM 600+D) 600-400 MG-UNIT per tablet Take 1 tablet by mouth 2 (two) times daily. @lunch  and in the evening    . cholecalciferol (VITAMIN D) 1000 UNITS  tablet Take 1,000 Units by mouth daily.     . Coenzyme Q10 (CO Q-10) 100 MG CAPS Take 1 capsule by mouth every morning.     Marland Kitchen dexamethasone (DECADRON) 4 MG tablet Take 5 tabs with food (=20mg ) 12 hrs prior to chemo. 15 tablet 4  . Docusate Calcium (STOOL SOFTENER PO) Take 1 tablet by mouth daily.    . fish oil-omega-3  fatty acids 1000 MG capsule Take 1 g by mouth every morning.     . Flaxseed, Linseed, (FLAX SEED OIL PO) Take 1,000 Units by mouth every evening.     Marland Kitchen LORazepam (ATIVAN) 0.5 MG tablet Place 1 tablet under the tongue or swallow every 6 hours as needed for nausea. Will make drowsy. Take 1 tablet night of first chemo whether or not any nausea. 20 tablet 0  . Magnesium 250 MG TABS Take 1 tablet by mouth every evening.     . Multiple Vitamins-Minerals (MULTIVITAMIN WITH MINERALS) tablet Take 1 tablet by mouth every morning.     . ondansetron (ZOFRAN) 8 MG tablet Take 1 tab every 8 hrs as needed for nausea. Take morning after chemo whether or not any nausea. 30 tablet 1  . ramipril (ALTACE) 5 MG capsule TAKE 1 CAPSULE (5 MG TOTAL) BY MOUTH DAILY. 90 capsule 0  . simvastatin (ZOCOR) 20 MG tablet TAKE 1 TABLET (20 MG TOTAL) BY MOUTH AT BEDTIME. 90 tablet 1  . vitamin E 400 UNIT capsule Take 400 Units by mouth every morning.     Marland Kitchen ibuprofen (ADVIL,MOTRIN) 600 MG tablet Take 1 tablet (600 mg total) by mouth every 6 (six) hours as needed for mild pain. (Patient not taking: Reported on 07/26/2014) 60 tablet 1  . loratadine (CLARITIN) 10 MG tablet Take 10 mg by mouth daily as needed for allergies.     . polyethylene glycol (MIRALAX / GLYCOLAX) packet Take 17 g by mouth daily.    Marland Kitchen PROCTOZONE-HC 2.5 % rectal cream Place 1 application rectally as needed.      No current facility-administered medications for this encounter.    REVIEW OF SYSTEMS:  A 15 point review of systems is documented in the electronic medical record. This was obtained by the nursing staff. However, I reviewed this with the patient to discuss relevant findings and make appropriate changes. The patient presented with vaginal spotting as above. She denies any significant pelvic or new onset back pain. Patient denies any further bleeding since her surgery. She seems to be tolerating the chemotherapy well at this time.   PHYSICAL EXAM:  height is  5\' 3"  (1.6 m) and weight is 117 lb 4.8 oz (53.207 kg). Her oral temperature is 98.8 F (37.1 C). Her blood pressure is 115/70 and her pulse is 94. Her respiration is 12 and oxygen saturation is 100%.   BP 115/70 mmHg  Pulse 94  Temp(Src) 98.8 F (37.1 C) (Oral)  Resp 12  Ht 5\' 3"  (1.6 m)  Wt 117 lb 4.8 oz (53.207 kg)  BMI 20.78 kg/m2  SpO2 100%  General Appearance:    Alert, cooperative, no distress, appears stated age  Head:    Normocephalic, without obvious abnormality, atraumatic  Eyes:    PERRL, conjunctiva/corneas clear, EOM's intact,        Nose:   Nares normal, septum midline, mucosa normal, no drainage    or sinus tenderness  Throat:   Lips, mucosa, and tongue normal; teeth and gums normal  Neck:   Supple, symmetrical, trachea midline,  no adenopathy;    thyroid:  no enlargement/tenderness/nodules; no carotid   bruit or JVD  Back:     Symmetric, no curvature, ROM normal, no CVA tenderness  Lungs:     Clear to auscultation bilaterally, respirations unlabored  Chest Wall:    No tenderness or deformity   Heart:    Regular rate and rhythm, S1 and S2 normal, no murmur, rub   or gallop     Abdomen:     Soft, non-tender, bowel sounds active all four quadrants,    no masses, no organomegaly, laparotomy scar is well-healed   Genitalia:   deferred until simulation and planning day   Rectal:   deferred until simulation and planning day   Extremities:   Extremities normal, atraumatic, no cyanosis or edema  Pulses:   2+ and symmetric all extremities  Skin:   Skin color, texture, turgor normal, no rashes or lesions  Lymph nodes:   Cervical, supraclavicular, and axillary nodes normal  Neurologic:    normal strength, sensation and reflexes    throughout     ECOG = 0  0 - Asymptomatic (Fully active, able to carry on all predisease activities without restriction)   LABORATORY DATA:  Lab Results  Component Value Date   WBC 8.6 08/02/2014   HGB 12.8 08/02/2014   HCT 37.7  08/02/2014   MCV 90.6 08/02/2014   PLT 236 08/02/2014   NEUTROABS 7.5* 08/02/2014   Lab Results  Component Value Date   NA 140 07/26/2014   K 4.0 07/26/2014   CL 101 06/06/2014   CO2 20* 07/26/2014   GLUCOSE 196* 07/26/2014   CREATININE 0.8 07/26/2014   CALCIUM 9.6 07/26/2014      RADIOGRAPHY: Preop chest x-ray, CT scan of abdomen and pelvis showing no evidence of metastatic spread   IMPRESSION: 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 Given the histology the patient would be at risk for vaginal vault recurrence and I would agree with recommendations for vaginal brachytherapy as part of her management. Given the extensive nodal dissection without evidence of metastatic spread and adjuvant chemotherapy ongoing,  I would not recommend external beam radiation therapy as part of the patient's management. I discussed treatment course side effects and potential toxicities of radiation therapy in this situation with the patient. She appears to understand and wishes to proceed with planned course of treatment.  PLAN: The patient will be scheduled for 5 vaginal brachytherapy treatments. I will discuss with Dr. Marko Plume  consideration for initiation of this treatment during her chemotherapy given the low volume of irradiated tissues with this treatment application. The patient is eager to complete all of her therapy as soon as possible. She also appears to have a good performance status.   I spent 60 minutes minutes face to face with the patient and more than 50% of that time was spent in counseling and/or coordination of care.   ------------------------------------------------  Blair Promise, PhD, MD

## 2014-08-09 ENCOUNTER — Ambulatory Visit (HOSPITAL_BASED_OUTPATIENT_CLINIC_OR_DEPARTMENT_OTHER): Payer: Medicare HMO

## 2014-08-09 ENCOUNTER — Telehealth: Payer: Self-pay | Admitting: *Deleted

## 2014-08-09 ENCOUNTER — Telehealth: Payer: Self-pay | Admitting: Oncology

## 2014-08-09 ENCOUNTER — Encounter: Payer: Self-pay | Admitting: Oncology

## 2014-08-09 ENCOUNTER — Other Ambulatory Visit (HOSPITAL_BASED_OUTPATIENT_CLINIC_OR_DEPARTMENT_OTHER): Payer: Medicare HMO

## 2014-08-09 ENCOUNTER — Ambulatory Visit (HOSPITAL_BASED_OUTPATIENT_CLINIC_OR_DEPARTMENT_OTHER): Payer: Medicare HMO | Admitting: Oncology

## 2014-08-09 VITALS — BP 128/76 | HR 108 | Temp 98.1°F | Ht 63.0 in | Wt 114.6 lb

## 2014-08-09 DIAGNOSIS — D701 Agranulocytosis secondary to cancer chemotherapy: Secondary | ICD-10-CM

## 2014-08-09 DIAGNOSIS — C541 Malignant neoplasm of endometrium: Secondary | ICD-10-CM

## 2014-08-09 DIAGNOSIS — C55 Malignant neoplasm of uterus, part unspecified: Secondary | ICD-10-CM

## 2014-08-09 DIAGNOSIS — I10 Essential (primary) hypertension: Secondary | ICD-10-CM

## 2014-08-09 DIAGNOSIS — N319 Neuromuscular dysfunction of bladder, unspecified: Secondary | ICD-10-CM

## 2014-08-09 DIAGNOSIS — M47896 Other spondylosis, lumbar region: Secondary | ICD-10-CM

## 2014-08-09 DIAGNOSIS — N281 Cyst of kidney, acquired: Secondary | ICD-10-CM

## 2014-08-09 DIAGNOSIS — Z5111 Encounter for antineoplastic chemotherapy: Secondary | ICD-10-CM

## 2014-08-09 DIAGNOSIS — T451X5A Adverse effect of antineoplastic and immunosuppressive drugs, initial encounter: Secondary | ICD-10-CM

## 2014-08-09 LAB — COMPREHENSIVE METABOLIC PANEL (CC13)
ALT: 16 U/L (ref 0–55)
AST: 16 U/L (ref 5–34)
Albumin: 4.2 g/dL (ref 3.5–5.0)
Alkaline Phosphatase: 82 U/L (ref 40–150)
Anion Gap: 14 mEq/L — ABNORMAL HIGH (ref 3–11)
BUN: 15.1 mg/dL (ref 7.0–26.0)
CO2: 21 mEq/L — ABNORMAL LOW (ref 22–29)
Calcium: 9.8 mg/dL (ref 8.4–10.4)
Chloride: 102 mEq/L (ref 98–109)
Creatinine: 0.8 mg/dL (ref 0.6–1.1)
EGFR: 77 mL/min/{1.73_m2} — ABNORMAL LOW (ref >90)
Glucose: 170 mg/dl — ABNORMAL HIGH (ref 70–140)
Potassium: 3.6 mEq/L (ref 3.5–5.1)
Sodium: 138 mEq/L (ref 136–145)
Total Bilirubin: 0.45 mg/dL (ref 0.20–1.20)
Total Protein: 7.4 g/dL (ref 6.4–8.3)

## 2014-08-09 LAB — CBC WITH DIFFERENTIAL/PLATELET
BASO%: 0.2 % (ref 0.0–2.0)
Basophils Absolute: 0 10*3/uL (ref 0.0–0.1)
EOS%: 0 % (ref 0.0–7.0)
Eosinophils Absolute: 0 10*3/uL (ref 0.0–0.5)
HCT: 40.3 % (ref 34.8–46.6)
HGB: 13 g/dL (ref 11.6–15.9)
LYMPH%: 8.4 % — ABNORMAL LOW (ref 14.0–49.7)
MCH: 30 pg (ref 25.1–34.0)
MCHC: 32.2 g/dL (ref 31.5–36.0)
MCV: 93.2 fL (ref 79.5–101.0)
MONO#: 0 10*3/uL — ABNORMAL LOW (ref 0.1–0.9)
MONO%: 1.2 % (ref 0.0–14.0)
NEUT#: 2.6 10*3/uL (ref 1.5–6.5)
NEUT%: 90.2 % — ABNORMAL HIGH (ref 38.4–76.8)
Platelets: 169 10*3/uL (ref 145–400)
RBC: 4.32 10*6/uL (ref 3.70–5.45)
RDW: 14 % (ref 11.2–14.5)
WBC: 2.9 10*3/uL — ABNORMAL LOW (ref 3.9–10.3)
lymph#: 0.2 10*3/uL — ABNORMAL LOW (ref 0.9–3.3)

## 2014-08-09 MED ORDER — DIPHENHYDRAMINE HCL 50 MG/ML IJ SOLN
50.0000 mg | Freq: Once | INTRAMUSCULAR | Status: AC
  Administered 2014-08-09: 50 mg via INTRAVENOUS

## 2014-08-09 MED ORDER — DEXAMETHASONE SODIUM PHOSPHATE 20 MG/5ML IJ SOLN
INTRAMUSCULAR | Status: AC
  Filled 2014-08-09: qty 5

## 2014-08-09 MED ORDER — FAMOTIDINE IN NACL 20-0.9 MG/50ML-% IV SOLN
20.0000 mg | Freq: Once | INTRAVENOUS | Status: AC
  Administered 2014-08-09: 20 mg via INTRAVENOUS

## 2014-08-09 MED ORDER — DIPHENHYDRAMINE HCL 50 MG/ML IJ SOLN
INTRAMUSCULAR | Status: AC
  Filled 2014-08-09: qty 1

## 2014-08-09 MED ORDER — FAMOTIDINE IN NACL 20-0.9 MG/50ML-% IV SOLN
INTRAVENOUS | Status: AC
  Filled 2014-08-09: qty 50

## 2014-08-09 MED ORDER — DEXTROSE 5 % IV SOLN
80.0000 mg/m2 | Freq: Once | INTRAVENOUS | Status: AC
  Administered 2014-08-09: 126 mg via INTRAVENOUS
  Filled 2014-08-09: qty 21

## 2014-08-09 MED ORDER — ONDANSETRON 8 MG/NS 50 ML IVPB
INTRAVENOUS | Status: AC
  Filled 2014-08-09: qty 8

## 2014-08-09 MED ORDER — SODIUM CHLORIDE 0.9 % IJ SOLN
10.0000 mL | INTRAMUSCULAR | Status: DC | PRN
  Filled 2014-08-09: qty 10

## 2014-08-09 MED ORDER — DEXAMETHASONE SODIUM PHOSPHATE 20 MG/5ML IJ SOLN
20.0000 mg | Freq: Once | INTRAMUSCULAR | Status: AC
  Administered 2014-08-09: 20 mg via INTRAVENOUS

## 2014-08-09 MED ORDER — HEPARIN SOD (PORK) LOCK FLUSH 100 UNIT/ML IV SOLN
500.0000 [IU] | Freq: Once | INTRAVENOUS | Status: DC
  Filled 2014-08-09: qty 5

## 2014-08-09 MED ORDER — SODIUM CHLORIDE 0.9 % IV SOLN
Freq: Once | INTRAVENOUS | Status: AC
  Administered 2014-08-09: 10:00:00 via INTRAVENOUS

## 2014-08-09 MED ORDER — ONDANSETRON 8 MG/50ML IVPB (CHCC)
8.0000 mg | Freq: Once | INTRAVENOUS | Status: AC
  Administered 2014-08-09: 8 mg via INTRAVENOUS

## 2014-08-09 NOTE — Telephone Encounter (Signed)
Called patient to inform of HDR Vag. Cuff Case, spoke with patient and she is aware of this case and all the appts. Associated with it.

## 2014-08-09 NOTE — Progress Notes (Signed)
OFFICE PROGRESS NOTE   August 09, 2014   Physicians: P.Gehrig, M.J.Baxley, K.Fogleman, H.Grueber, Juanita Craver, [facility]) Consultation Dr Sondra Come pending 08-08-14.  INTERVAL HISTORY:  Patient is seen, alone for visit, in continuing attention to adjuvant chemotherapy in process for IA serous endometrial carcinoma, due day 8 cycle 2 dose dense carbo taxol today.  Counts are better with granix 300 mcg day after each treatment. She is tolerating the chemotherapy well overall, tho very aware of side effects. She met Dr Sondra Come on 08-08-14 and will have vaginal brachytherapy while chemotherapy is ongoing.  Patient notices tachycardia and facial flushing day after chemo, which is likely still related to steroids, tho she has also noticed tachycardia after granix. Constipation has improved with addition of one stool softener daily, with bowel movements daily last 3 days. Taste is altered, however she can drink cranberry juice and gingerale, and tolerated CIB. She has not had nausea and no peripheral neuropathy. She is not too fatigued, back walking 1.5 miles daily on treadmill, which she does in 30 min. She has had one bottle of water this AM.   No PAC Flu vaccine done  ONCOLOGIC HISTORY Patient has had chronic low back pain which continued 01-2014, with new vaginal spotting then. She saw Dr Pamala Hurry, with exam finding very atrophic vagina and hemorrhoids. With continued spotting whe had [place] which showed 4x3x2 cm uterus with 4.8 mm endometrial stripe and 2 small lesions felt to be polyps. D and C 05-17-14 ([id number] found high grade endometrial carcinoma with serous features. CT AP and CXR 05-31-14 showed no evidence of metastatic disease. She was seen by Dr Alycia Rossetti and taken to robotic assisted hysterectomy BSO pelvic and paraaortic nodes 06-05-14. Pathology [phone]) had superficially invasive serous carcinoma, stage IA. Post operative course was uncomplicated. Patient discussed pathology findings  and recommendations with Dr Alycia Rossetti by phone, and is to see her for post operative follow up visit on 07-19-14. Dr Alycia Rossetti recommended adjuvant taxol carboplatin + vaginal brachytherapy. First carbo taxol given 07-12-14, dose dense regimen chosen as patient very anxious about possible chemotherapy side effects. Granix added day after each treatment after ANC 1.0 such that day 15 cycle 1 was held.   Review of systems as above, also: No fever or symptoms of infection. Better bladder sensation now, tho she is still trying to void every 2-3 hours RTC. No LE swelling. No bleeding. Rash has resolved and she is using gentle soap and lotion. She is losing hair. Remainder of 10 point Review of Systems negative.  Objective:  Vital signs in last 24 hours:  Temp 98.1, BP 128/76, HR 108, weight 114.9. Respirations 20 and not labored RA. Alert, oriented and appropriate. Ambulatory without difficulty. Having some trouble focusing conversation with steroids, but NAD. Alopecia  HEENT:PERRL, sclerae not icteric. Oral mucosa moist without lesions, posterior pharynx clear.  Neck supple. No JVD.  Lymphatics:no cervical,supraclavicular or inguinal adenopathy Resp: clear to auscultation bilaterally and normal percussion bilaterally Cardio:tachy,  regular rate and rhythm. No gallop. Clear heart sounds. GI: soft, nontender, not distended, no mass or organomegaly. Normally active bowel sounds. Surgical incisions not remarkable. Musculoskeletal/ Extremities: without pitting edema, cords, tenderness Neuro: no peripheral neuropathy. Slightly tremulous with steroids. Otherwise nonfocal. PSYCH very talkative on steroids, but mood and affect appropriate Skin without rash, ecchymosis, petechiae. Slightly diaphoretic.    Lab Results:  Results for orders placed or performed in visit on 08/09/14  CBC with Differential  Result Value Ref Range   WBC 2.9 (L) 3.9 -  10.3 10e3/uL   NEUT# 2.6 1.5 - 6.5 10e3/uL   HGB 13.0 11.6 -  15.9 g/dL   HCT 40.3 34.8 - 46.6 %   Platelets 169 145 - 400 10e3/uL   MCV 93.2 79.5 - 101.0 fL   MCH 30.0 25.1 - 34.0 pg   MCHC 32.2 31.5 - 36.0 g/dL   RBC 4.32 3.70 - 5.45 10e6/uL   RDW 14.0 11.2 - 14.5 %   lymph# 0.2 (L) 0.9 - 3.3 10e3/uL   MONO# 0.0 (L) 0.1 - 0.9 10e3/uL   Eosinophils Absolute 0.0 0.0 - 0.5 10e3/uL   Basophils Absolute 0.0 0.0 - 0.1 10e3/uL   NEUT% 90.2 (H) 38.4 - 76.8 %   LYMPH% 8.4 (L) 14.0 - 49.7 %   MONO% 1.2 0.0 - 14.0 %   EOS% 0.0 0.0 - 7.0 %   BASO% 0.2 0.0 - 2.0 %  Comprehensive metabolic panel (Cmet) - [facility]  Result Value Ref Range   Sodium 138 136 - 145 mEq/L   Potassium 3.6 3.5 - 5.1 mEq/L   Chloride 102 98 - 109 mEq/L   CO2 21 (L) 22 - 29 mEq/L   Glucose 170 (H) 70 - 140 mg/dl   BUN 15.1 7.0 - 26.0 mg/dL   Creatinine 0.8 0.6 - 1.1 mg/dL   Total Bilirubin 0.45 0.20 - 1.20 mg/dL   Alkaline Phosphatase 82 40 - 150 U/L   AST 16 5 - 34 U/L   ALT 16 0 - 55 U/L   Total Protein 7.4 6.4 - 8.3 g/dL   Albumin 4.2 3.5 - 5.0 g/dL   Calcium 9.8 8.4 - 10.4 mg/dL   Anion Gap 14 (H) 3 - 11 mEq/L   EGFR 77 (L) >90 ml/min/1.73 m2     Studies/Results:  No results found.  Medications: I have reviewed the patient's current medications. IVF (NS) rate increased to 150 cc/ hr with chemo today due to slight tachycardia. Will let patient know to decrease decadron oral premed to 4 tablets = 16 mg 12 hrs prior with subsequent treatments; she will still get 20 mg IV prior to taxol administration.  Continue stool softener 1-2x daily; ok for miralax (dislikes taste) or senokot also if needed. Continue granix day after each treatment  DISCUSSION: discussed all of interval history including medication side effects and diet. Encouraged to push fluids. She is in full agreement with continuing as planned.   Assessment/Plan:  1.IA high grade serous endometrial carcinoma: post robotic assisted hysterectomy with BSO and bilateral pelvic and para aortic node evaluation  06-05-2014. Continuing adjuvant chemotherapy, day 8 cycle 2 today, gCSF support. Anticipate vaginal brachytherapy during chemotherapy. 2.probable contact rashes: all improved. Latex allergy clearly noted in EMR. 3.Poor tolerance of steroids: will decrease premed decadron to 16 mg 12 hrs prior beginning day 15 cycle 2 4. Po intake improved 5.bladder sensation improving out further from surgery, continuing frequent voiding 6.left renal cyst on CT, without concerning features 7.post benign biopsy left breast 1990s, up to date on mammograms  8.benign skin lesions, followed yearly by Dr Hedy Jacob 9. Up to date colonoscopy 10.atherosclerosis by CT 11.lumbar spondylosis with some chronic back discomfort 12.hypertension, elevated lipids followed by PCP 13. flu vaccine done   All questions answered. Chemo and granix orders confirmed. I will see her every 1-2 weeks as treatment continues, and she knows to call prior to scheduled return visit if needed. Time spent 25 min including >50% counseling and coordination of care.   Gordy Levan, MD  08/09/2014, 8:38 AM

## 2014-08-09 NOTE — Patient Instructions (Signed)
[facility] Discharge Instructions for Patients Receiving Chemotherapy  Today you received the following chemotherapy agents tAXOL To help prevent nausea and vomiting after your treatment, we encourage you to take your nausea medication AS PRESCRIBED.   If you develop nausea and vomiting that is not controlled by your nausea medication, call the clinic.   BELOW ARE SYMPTOMS THAT SHOULD BE REPORTED IMMEDIATELY:  *FEVER GREATER THAN 100.5 F  *CHILLS WITH OR WITHOUT FEVER  NAUSEA AND VOMITING THAT IS NOT CONTROLLED WITH YOUR NAUSEA MEDICATION  *UNUSUAL SHORTNESS OF BREATH  *UNUSUAL BRUISING OR BLEEDING  TENDERNESS IN MOUTH AND THROAT WITH OR WITHOUT PRESENCE OF ULCERS  *URINARY PROBLEMS  *BOWEL PROBLEMS  UNUSUAL RASH Items with * indicate a potential emergency and should be followed up as soon as possible.  Feel free to call the clinic you have any questions or concerns. The clinic phone number is [phone].

## 2014-08-09 NOTE — Telephone Encounter (Signed)
, °

## 2014-08-10 ENCOUNTER — Telehealth: Payer: Self-pay | Admitting: *Deleted

## 2014-08-10 ENCOUNTER — Telehealth: Payer: Self-pay

## 2014-08-10 ENCOUNTER — Ambulatory Visit (HOSPITAL_BASED_OUTPATIENT_CLINIC_OR_DEPARTMENT_OTHER): Payer: Medicare HMO

## 2014-08-10 DIAGNOSIS — C541 Malignant neoplasm of endometrium: Secondary | ICD-10-CM

## 2014-08-10 DIAGNOSIS — C55 Malignant neoplasm of uterus, part unspecified: Secondary | ICD-10-CM

## 2014-08-10 DIAGNOSIS — Z5189 Encounter for other specified aftercare: Secondary | ICD-10-CM

## 2014-08-10 MED ORDER — DEXAMETHASONE 4 MG PO TABS: ORAL_TABLET | ORAL | Status: DC

## 2014-08-10 MED ORDER — TBO-FILGRASTIM 300 MCG/0.5ML ~~LOC~~ SOSY
300.0000 ug | PREFILLED_SYRINGE | Freq: Once | SUBCUTANEOUS | Status: AC
  Administered 2014-08-10: 300 ug via SUBCUTANEOUS
  Filled 2014-08-10: qty 0.5

## 2014-08-10 NOTE — Telephone Encounter (Signed)
Per staff message and POF I have scheduled appts. Advised scheduler of appts. JMW  

## 2014-08-10 NOTE — Telephone Encounter (Signed)
Told Annette Tucker that Dr. Marko Plume said that she can decrease her decadron premed to 4 tablets which is 16 mg 12 hrs prior to Taxol.  This will beging with next weeks treatment. Dr. Marko Plume also wants her to push po fluids if she feels her heart beating fast.  Annette Tucker verbalized understanding. Refill sent to her pharmacy.

## 2014-08-14 ENCOUNTER — Other Ambulatory Visit: Payer: Self-pay | Admitting: Radiation Oncology

## 2014-08-15 ENCOUNTER — Other Ambulatory Visit: Payer: Self-pay

## 2014-08-15 DIAGNOSIS — C541 Malignant neoplasm of endometrium: Secondary | ICD-10-CM

## 2014-08-16 ENCOUNTER — Ambulatory Visit (HOSPITAL_BASED_OUTPATIENT_CLINIC_OR_DEPARTMENT_OTHER): Payer: Medicare HMO

## 2014-08-16 ENCOUNTER — Ambulatory Visit (HOSPITAL_BASED_OUTPATIENT_CLINIC_OR_DEPARTMENT_OTHER): Payer: Medicare HMO | Admitting: Oncology

## 2014-08-16 ENCOUNTER — Other Ambulatory Visit (HOSPITAL_BASED_OUTPATIENT_CLINIC_OR_DEPARTMENT_OTHER): Payer: Medicare HMO

## 2014-08-16 ENCOUNTER — Encounter: Payer: Self-pay | Admitting: Oncology

## 2014-08-16 VITALS — BP 122/77 | HR 105 | Temp 98.6°F | Resp 18 | Ht 63.0 in | Wt 115.9 lb

## 2014-08-16 DIAGNOSIS — D701 Agranulocytosis secondary to cancer chemotherapy: Secondary | ICD-10-CM

## 2014-08-16 DIAGNOSIS — Z5111 Encounter for antineoplastic chemotherapy: Secondary | ICD-10-CM

## 2014-08-16 DIAGNOSIS — C541 Malignant neoplasm of endometrium: Secondary | ICD-10-CM

## 2014-08-16 DIAGNOSIS — T451X5A Adverse effect of antineoplastic and immunosuppressive drugs, initial encounter: Secondary | ICD-10-CM

## 2014-08-16 LAB — COMPREHENSIVE METABOLIC PANEL (CC13)
ALT: 22 U/L (ref 0–55)
AST: 19 U/L (ref 5–34)
Albumin: 4 g/dL (ref 3.5–5.0)
Alkaline Phosphatase: 73 U/L (ref 40–150)
Anion Gap: 12 mEq/L — ABNORMAL HIGH (ref 3–11)
BUN: 16.7 mg/dL (ref 7.0–26.0)
CO2: 20 mEq/L — ABNORMAL LOW (ref 22–29)
Calcium: 9.6 mg/dL (ref 8.4–10.4)
Chloride: 105 mEq/L (ref 98–109)
Creatinine: 0.8 mg/dL (ref 0.6–1.1)
EGFR: 78 mL/min/{1.73_m2} — ABNORMAL LOW (ref >90)
Glucose: 154 mg/dl — ABNORMAL HIGH (ref 70–140)
Potassium: 4.2 mEq/L (ref 3.5–5.1)
Sodium: 137 mEq/L (ref 136–145)
Total Bilirubin: 0.26 mg/dL (ref 0.20–1.20)
Total Protein: 7 g/dL (ref 6.4–8.3)

## 2014-08-16 LAB — CBC WITH DIFFERENTIAL/PLATELET
BASO%: 0.1 % (ref 0.0–2.0)
Basophils Absolute: 0 10*3/uL (ref 0.0–0.1)
EOS%: 0 % (ref 0.0–7.0)
Eosinophils Absolute: 0 10*3/uL (ref 0.0–0.5)
HCT: 37 % (ref 34.8–46.6)
HGB: 12 g/dL (ref 11.6–15.9)
LYMPH%: 10.3 % — ABNORMAL LOW (ref 14.0–49.7)
MCH: 30.2 pg (ref 25.1–34.0)
MCHC: 32.4 g/dL (ref 31.5–36.0)
MCV: 93.2 fL (ref 79.5–101.0)
MONO#: 0.2 10*3/uL (ref 0.1–0.9)
MONO%: 4.7 % (ref 0.0–14.0)
NEUT#: 3.2 10*3/uL (ref 1.5–6.5)
NEUT%: 84.9 % — ABNORMAL HIGH (ref 38.4–76.8)
Platelets: 275 10*3/uL (ref 145–400)
RBC: 3.97 10*6/uL (ref 3.70–5.45)
RDW: 14.6 % — ABNORMAL HIGH (ref 11.2–14.5)
WBC: 3.8 10*3/uL — ABNORMAL LOW (ref 3.9–10.3)
lymph#: 0.4 10*3/uL — ABNORMAL LOW (ref 0.9–3.3)

## 2014-08-16 MED ORDER — DIPHENHYDRAMINE HCL 50 MG/ML IJ SOLN
INTRAMUSCULAR | Status: AC
Start: 2014-08-16 — End: 2014-08-16
  Filled 2014-08-16: qty 1

## 2014-08-16 MED ORDER — ONDANSETRON 8 MG/NS 50 ML IVPB
INTRAVENOUS | Status: AC
  Filled 2014-08-16: qty 8

## 2014-08-16 MED ORDER — DEXTROSE 5 % IV SOLN
80.0000 mg/m2 | Freq: Once | INTRAVENOUS | Status: AC
  Administered 2014-08-16: 126 mg via INTRAVENOUS
  Filled 2014-08-16: qty 21

## 2014-08-16 MED ORDER — DIPHENHYDRAMINE HCL 50 MG/ML IJ SOLN
50.0000 mg | Freq: Once | INTRAMUSCULAR | Status: AC
  Administered 2014-08-16: 50 mg via INTRAVENOUS

## 2014-08-16 MED ORDER — FAMOTIDINE IN NACL 20-0.9 MG/50ML-% IV SOLN
20.0000 mg | Freq: Once | INTRAVENOUS | Status: AC
  Administered 2014-08-16: 20 mg via INTRAVENOUS

## 2014-08-16 MED ORDER — DEXAMETHASONE SODIUM PHOSPHATE 20 MG/5ML IJ SOLN
20.0000 mg | Freq: Once | INTRAMUSCULAR | Status: AC
  Administered 2014-08-16: 20 mg via INTRAVENOUS

## 2014-08-16 MED ORDER — DEXAMETHASONE SODIUM PHOSPHATE 20 MG/5ML IJ SOLN
INTRAMUSCULAR | Status: AC
  Filled 2014-08-16: qty 5

## 2014-08-16 MED ORDER — FAMOTIDINE IN NACL 20-0.9 MG/50ML-% IV SOLN
INTRAVENOUS | Status: AC
  Filled 2014-08-16: qty 50

## 2014-08-16 MED ORDER — LORAZEPAM 0.5 MG PO TABS: ORAL_TABLET | ORAL | Status: DC

## 2014-08-16 MED ORDER — DIPHENHYDRAMINE HCL 50 MG/ML IJ SOLN
INTRAMUSCULAR | Status: AC
  Filled 2014-08-16: qty 1

## 2014-08-16 MED ORDER — SODIUM CHLORIDE 0.9 % IV SOLN
Freq: Once | INTRAVENOUS | Status: AC
  Administered 2014-08-16: 10:00:00 via INTRAVENOUS

## 2014-08-16 MED ORDER — ONDANSETRON 8 MG/50ML IVPB (CHCC)
8.0000 mg | Freq: Once | INTRAVENOUS | Status: AC
Start: 2014-08-16 — End: 2014-08-16
  Administered 2014-08-16: 8 mg via INTRAVENOUS

## 2014-08-16 NOTE — Patient Instructions (Signed)
Paclitaxel injection What is this medicine? PACLITAXEL (PAK li TAX el) is a chemotherapy drug. It targets fast dividing cells, like cancer cells, and causes these cells to die. This medicine is used to treat ovarian cancer, breast cancer, and other cancers. This medicine may be used for other purposes; ask your health care provider or pharmacist if you have questions. COMMON BRAND NAME(S): Onxol, Taxol What should I tell my health care provider before I take this medicine? They need to know if you have any of these conditions: -blood disorders -irregular heartbeat -infection (especially a virus infection such as chickenpox, cold sores, or herpes) -liver disease -previous or ongoing radiation therapy -an unusual or allergic reaction to paclitaxel, alcohol, polyoxyethylated castor oil, other chemotherapy agents, other medicines, foods, dyes, or preservatives -pregnant or trying to get pregnant -breast-feeding How should I use this medicine? This drug is given as an infusion into a vein. It is administered in a hospital or clinic by a specially trained health care professional. Talk to your pediatrician regarding the use of this medicine in children. Special care may be needed. Overdosage: If you think you have taken too much of this medicine contact a poison control center or emergency room at once. NOTE: This medicine is only for you. Do not share this medicine with others. What if I miss a dose? It is important not to miss your dose. Call your doctor or health care professional if you are unable to keep an appointment. What may interact with this medicine? Do not take this medicine with any of the following medications: -disulfiram -metronidazole This medicine may also interact with the following medications: -cyclosporine -diazepam -ketoconazole -medicines to increase blood counts like filgrastim, pegfilgrastim, sargramostim -other chemotherapy drugs like cisplatin, doxorubicin,  epirubicin, etoposide, teniposide, vincristine -quinidine -testosterone -vaccines -verapamil Talk to your doctor or health care professional before taking any of these medicines: -acetaminophen -aspirin -ibuprofen -ketoprofen -naproxen This list may not describe all possible interactions. Give your health care provider a list of all the medicines, herbs, non-prescription drugs, or dietary supplements you use. Also tell them if you smoke, drink alcohol, or use illegal drugs. Some items may interact with your medicine. What should I watch for while using this medicine? Your condition will be monitored carefully while you are receiving this medicine. You will need important blood work done while you are taking this medicine. This drug may make you feel generally unwell. This is not uncommon, as chemotherapy can affect healthy cells as well as cancer cells. Report any side effects. Continue your course of treatment even though you feel ill unless your doctor tells you to stop. In some cases, you may be given additional medicines to help with side effects. Follow all directions for their use. Call your doctor or health care professional for advice if you get a fever, chills or sore throat, or other symptoms of a cold or flu. Do not treat yourself. This drug decreases your body's ability to fight infections. Try to avoid being around people who are sick. This medicine may increase your risk to bruise or bleed. Call your doctor or health care professional if you notice any unusual bleeding. Be careful brushing and flossing your teeth or using a toothpick because you may get an infection or bleed more easily. If you have any dental work done, tell your dentist you are receiving this medicine. Avoid taking products that contain aspirin, acetaminophen, ibuprofen, naproxen, or ketoprofen unless instructed by your doctor. These medicines may hide a fever.   Do not become pregnant while taking this medicine.  Women should inform their doctor if they wish to become pregnant or think they might be pregnant. There is a potential for serious side effects to an unborn child. Talk to your health care professional or pharmacist for more information. Do not breast-feed an infant while taking this medicine. Men are advised not to father a child while receiving this medicine. What side effects may I notice from receiving this medicine? Side effects that you should report to your doctor or health care professional as soon as possible: -allergic reactions like skin rash, itching or hives, swelling of the face, lips, or tongue -low blood counts - This drug may decrease the number of white blood cells, red blood cells and platelets. You may be at increased risk for infections and bleeding. -signs of infection - fever or chills, cough, sore throat, pain or difficulty passing urine -signs of decreased platelets or bleeding - bruising, pinpoint red spots on the skin, black, tarry stools, nosebleeds -signs of decreased red blood cells - unusually weak or tired, fainting spells, lightheadedness -breathing problems -chest pain -high or low blood pressure -mouth sores -nausea and vomiting -pain, swelling, redness or irritation at the injection site -pain, tingling, numbness in the hands or feet -slow or irregular heartbeat -swelling of the ankle, feet, hands Side effects that usually do not require medical attention (report to your doctor or health care professional if they continue or are bothersome): -bone pain -complete hair loss including hair on your head, underarms, pubic hair, eyebrows, and eyelashes -changes in the color of fingernails -diarrhea -loosening of the fingernails -loss of appetite -muscle or joint pain -red flush to skin -sweating This list may not describe all possible side effects. Call your doctor for medical advice about side effects. You may report side effects to FDA at  1-800-FDA-1088. Where should I keep my medicine? This drug is given in a hospital or clinic and will not be stored at home. NOTE: This sheet is a summary. It may not cover all possible information. If you have questions about this medicine, talk to your doctor, pharmacist, or health care provider.  2015, [employer]. (2012-08-08 16:41:21)  

## 2014-08-16 NOTE — Progress Notes (Signed)
OFFICE PROGRESS NOTE   August 16, 2014   Physicians:P.Gehrig, J.Kinard, M.J.Baxley, K.Fogleman, H.Grueber, Jyothi Mann, (Maureen Jarrell)  INTERVAL HISTORY:  Patient is seen, alone for visit, in continuing attention to adjuvant chemotherapy in process for IA serous endometrial carcinoma, due day 15 cycle 2 dose dense carbo taxol today. Counts are maintaining better with gCSF 300 mg day after each treatment. She begins HDR by Dr Sondra Come on 08-21-14.  Patient tells me that she has had a good week, feeling well. She has some slight sensation change at fingers, not bothersome. She had some constipation, resolved with addition of stool softener to usual magnesium and prunes. She tolerated chemo better last week with increased benadryl to 50 mg and has felt better today with premed decadron decreased to 16 mg 12 hrs prior starting last pm. She has been walking 1.5 miles daily on treadmill.   No PAC Flu vaccine done  ONCOLOGIC HISTORY Patient has had chronic low back pain which continued 01-2014, with new vaginal spotting then. She saw Dr Pamala Hurry, with exam finding very atrophic vagina and hemorrhoids. With continued spotting whe had [place] which showed 4x3x2 cm uterus with 4.8 mm endometrial stripe and 2 small lesions felt to be polyps. D and C 05-17-14 ([id number] found high grade endometrial carcinoma with serous features. CT AP and CXR 05-31-14 showed no evidence of metastatic disease. She was seen by Dr Alycia Rossetti and taken to robotic assisted hysterectomy BSO pelvic and paraaortic nodes 06-05-14. Pathology [phone]) had superficially invasive serous carcinoma, stage IA. Post operative course was uncomplicated. Patient discussed pathology findings and recommendations with Dr Alycia Rossetti by phone, and is to see her for post operative follow up visit on 07-19-14. Dr Alycia Rossetti recommended adjuvant taxol carboplatin + vaginal brachytherapy. First carbo taxol given 07-12-14, dose dense regimen chosen as patient very  anxious about possible chemotherapy side effects. Granix added day after each treatment after day 15 cycle 1 held for neutropenia.   Review of systems as above, also: No fever or symptoms of infection. No SOB or other respiratory symptoms. No LE swelling. No abdominal or pelvic pain. No bleeding. Is drinking fluids well. Taste changes happen ~ day 2. Remainder of 10 point Review of Systems negative.  Objective:  Vital signs in last 24 hours:  BP 122/77 mmHg  Pulse 105  Temp(Src) 98.6 F (37 C) (Oral)  Resp 18  Ht $R'5\' 3"'OH$  (1.6 m)  Wt 115 lb 14.4 oz (52.572 kg)  BMI 20.54 kg/m2 Weight is up ~ 1 lb. Alert, oriented and appropriate. Ambulatory without difficulty. Looks more comfortable overall today, not as agitated, no flushing.  Alopecia  HEENT:PERRL, sclerae not icteric. Oral mucosa moist without lesions, posterior pharynx clear.  Neck supple. No JVD.  Lymphatics:no cervical,supraclavicular or inguinal adenopathy Resp: clear to auscultation bilaterally and normal percussion bilaterally Cardio: somewhat tachy at 105, regular rate and rhythm. No gallop. Clear heart sounds. GI: soft, nontender, not distended, no mass or organomegaly. Normally active bowel sounds. Surgical incisions not remarkable. Musculoskeletal/ Extremities: without pitting edema, cords, tenderness Neuro: no peripheral neuropathy. Otherwise nonfocal Skin without rash, ecchymosis, petechiae   Lab Results:  Results for orders placed or performed in visit on 08/16/14  CBC with Differential  Result Value Ref Range   WBC 3.8 (L) 3.9 - 10.3 10e3/uL   NEUT# 3.2 1.5 - 6.5 10e3/uL   HGB 12.0 11.6 - 15.9 g/dL   HCT 37.0 34.8 - 46.6 %   Platelets 275 145 - 400 10e3/uL   MCV 93.2  79.5 - 101.0 fL   MCH 30.2 25.1 - 34.0 pg   MCHC 32.4 31.5 - 36.0 g/dL   RBC 3.97 3.70 - 5.45 10e6/uL   RDW 14.6 (H) 11.2 - 14.5 %   lymph# 0.4 (L) 0.9 - 3.3 10e3/uL   MONO# 0.2 0.1 - 0.9 10e3/uL   Eosinophils Absolute 0.0 0.0 - 0.5  10e3/uL   Basophils Absolute 0.0 0.0 - 0.1 10e3/uL   NEUT% 84.9 (H) 38.4 - 76.8 %   LYMPH% 10.3 (L) 14.0 - 49.7 %   MONO% 4.7 0.0 - 14.0 %   EOS% 0.0 0.0 - 7.0 %   BASO% 0.1 0.0 - 2.0 %  Comprehensive metabolic panel (Cmet) - [facility]  Result Value Ref Range   Sodium 137 136 - 145 mEq/L   Potassium 4.2 3.5 - 5.1 mEq/L   Chloride 105 98 - 109 mEq/L   CO2 20 (L) 22 - 29 mEq/L   Glucose 154 (H) 70 - 140 mg/dl   BUN 16.7 7.0 - 26.0 mg/dL   Creatinine 0.8 0.6 - 1.1 mg/dL   Total Bilirubin 0.26 0.20 - 1.20 mg/dL   Alkaline Phosphatase 73 40 - 150 U/L   AST 19 5 - 34 U/L   ALT 22 0 - 55 U/L   Total Protein 7.0 6.4 - 8.3 g/dL   Albumin 4.0 3.5 - 5.0 g/dL   Calcium 9.6 8.4 - 10.4 mg/dL   Anion Gap 12 (H) 3 - 11 mEq/L   EGFR 78 (L) >90 ml/min/1.73 m2    Reviewed CBC from today with patient at time of visit.  Studies/Results:  No results found.  Medications: I have reviewed the patient's current medications. OK to increase stool softener to 2 daily or add laxative if needed after chemo. WIll keep premed benadryl at 50 mg and keep oral decadron 16 mg 12 hrs prior. Ativan refilled  DISCUSSION: patient is pleased that this week has gone well and understands that we will continue to try to fine tune all of the medications for her system. Encouraged good hydration and good nutritional intake.   Assessment/Plan: 1.IA high grade serous endometrial carcinoma: post robotic assisted hysterectomy with BSO and bilateral pelvic and para aortic node evaluation 06-05-2014. Continuing adjuvant chemotherapy, day 15 cycle 2 today, gCSF support. Vaginal brachytherapy to begin 08-21-14. She will have day 1 cycle 3 on 2-25 as long as ANC >=1.5 and plt >=100k, with granix 2-26. I will see her with day 8 cycle 3 on 08-30-14.  2.probable contact rashes: all improved. Latex allergy clearly noted in EMR. 3.Poor tolerance of steroids:  decrease in premed decadron to 16 mg 12 hrs prior beginning day 15 cycle 2 seems helpful  so far; IV decadron will still be 20 mg with each premed. 4. Po intake improved, hydration improved 5.bladder sensation improving out further from surgery, continuing frequent voiding 6.left renal cyst on CT, without concerning features 7.post benign biopsy left breast 1990s, up to date on mammograms  8.benign skin lesions, followed yearly by Dr Hedy Jacob with that visit upcoming 9. Up to date colonoscopy 10.atherosclerosis by CT 11.lumbar spondylosis with some chronic back discomfort 12.hypertension, elevated lipids followed by PCP 13. flu vaccine done    Chemo and granix orders confirmed. Schedule reviewed with patient. She knows to call if concerns prior to next scheduled visit. Time spent 25 min including >50% counseling and coordination of care.   LIVESAY,LENNIS P, MD   08/16/2014, 8:53 AM

## 2014-08-17 ENCOUNTER — Ambulatory Visit (HOSPITAL_BASED_OUTPATIENT_CLINIC_OR_DEPARTMENT_OTHER): Payer: Medicare HMO

## 2014-08-17 DIAGNOSIS — D701 Agranulocytosis secondary to cancer chemotherapy: Secondary | ICD-10-CM

## 2014-08-17 DIAGNOSIS — C55 Malignant neoplasm of uterus, part unspecified: Secondary | ICD-10-CM

## 2014-08-17 DIAGNOSIS — C541 Malignant neoplasm of endometrium: Secondary | ICD-10-CM

## 2014-08-17 MED ORDER — TBO-FILGRASTIM 300 MCG/0.5ML ~~LOC~~ SOSY
300.0000 ug | PREFILLED_SYRINGE | Freq: Once | SUBCUTANEOUS | Status: AC
  Administered 2014-08-17: 300 ug via SUBCUTANEOUS
  Filled 2014-08-17: qty 0.5

## 2014-08-20 ENCOUNTER — Telehealth: Payer: Self-pay | Admitting: *Deleted

## 2014-08-20 NOTE — Telephone Encounter (Signed)
Called patient to remind of appts. For 08-21-14, spoke with patient and she is aware of these appts.

## 2014-08-21 ENCOUNTER — Ambulatory Visit
Admission: RE | Admit: 2014-08-21 | Discharge: 2014-08-21 | Disposition: A | Discharge status: Home / Self Care | Payer: Medicare HMO | Source: Ambulatory Visit | Attending: Radiation Oncology | Admitting: Radiation Oncology

## 2014-08-21 ENCOUNTER — Ambulatory Visit: Payer: Medicare HMO | Admitting: Radiation Oncology

## 2014-08-21 ENCOUNTER — Encounter: Payer: Self-pay | Admitting: Radiation Oncology

## 2014-08-21 VITALS — BP 119/71 | HR 96 | Temp 98.0°F | Resp 12 | Ht 63.0 in | Wt 116.7 lb

## 2014-08-21 DIAGNOSIS — C541 Malignant neoplasm of endometrium: Secondary | ICD-10-CM

## 2014-08-21 DIAGNOSIS — Z510 Encounter for antineoplastic radiation therapy: Secondary | ICD-10-CM | POA: Diagnosis not present

## 2014-08-21 NOTE — Progress Notes (Signed)
  Radiation Oncology         [phone] ________________________________  Name: LAQUEENA HINCHEY MRN: [id number]  Date: 08/21/2014  DOB: [birth year]  High-dose-rate brachytherapy procedure note   Current Dose: 6 Gy     Planned Dose:  30 Gy  Narrative . . . . . . . . After planning was complete the patient was transferred to the high dose rate treatment suite. The patient was placed in the dorsolithotomy position. The patient's vaginal cylinder was placed in the proximal vagina. This was affixed to the CT/MR stabilization plate to prevent slippage.  Verification simulation note  A fiducial mark was placed within the vaginal cylinder. The patient then proceeded to undergo imaging with an AP and lateral film. This was compared to the patient's planning films documenting accurate position of the vaginal cylinder for treatment.  High-dose-rate brachytherapy treatment  The remote afterloading device was affixed to the vaginal cylinder by catheter system. The patient then proceeded to undergo her first high-dose-rate treatment directed at the proximal vagina. The patient was prescribed a dose of 6 Gy to be delivered to the mucosal surface. This was achieved with the total dwell time of 236.3 seconds. Patient was treated with 1 channel using 8 dwell positions. The patient tolerated the procedure well. After completion of her therapy a radiation survey was performed documenting return of the iridium source into the Nucletron safe.   ________________________________   Blair Promise, PhD, MD

## 2014-08-21 NOTE — Progress Notes (Signed)
Annette Tucker here for HDR treatment.  She denies pain, nausea, bladder issues, bowel issues (last bm this morning) and vaginal bleeding/discharge.   BP 119/71 mmHg  Pulse 96  Temp(Src) 98 F (36.7 C) (Oral)  Resp 12  Ht 5\' 3"  (1.6 m)  Wt 116 lb 11.2 oz (52.935 kg)  BMI 20.68 kg/m2

## 2014-08-21 NOTE — Progress Notes (Signed)
  Radiation Oncology         (336) [phone] ________________________________  Name: Annette Tucker MRN: [id number]  Date: 08/21/2014  DOB: 1945/07/14  Simple treatment device  Note      ICD-9-CM ICD-10-CM   1. Endometrial ca 182.0 C54.1      Narrative:  The patient had construction of her custom vaginal cylinder. The optimal diameter to distend the vaginal vault was a 2.5 cm diameter cylinder.   Distally towards the vaginal introitus a 2.0 cm ring was placed                         ____________________________________ Blair Promise, MD

## 2014-08-21 NOTE — Progress Notes (Signed)
  Radiation Oncology         (336) [phone] ________________________________  Name: Annette Tucker MRN: [id number]  Date: 08/21/2014  DOB: 01-14-1946  Vaginal brachytherapy procedure Note  CC: [name], MD  Gordy Levan, MD    ICD-9-CM ICD-10-CM   1. Endometrial ca 182.0 C54.1     Diagnosis: FIGO stage IA high-grade endometrial serous carcinoma      Narrative:  The patient was taken to the nursing suite and placed on the examination table. A pelvic exam was performed. The vaginal cuff was noted to be intact. No pelvic masses were appreciated. There was some mild yellowish changes to the vaginal cuff area but no signs of infection.  Patient proceeded to undergo fitting for her vaginal cylinder. The optimal diameter to distend the vaginal vault was a 2.5 cm cylinder. Latex free gloves were used during the procedure. A latex free cover was placed over the vaginal cylinder prior to insertion into the patient. Patient tolerated the procedure well. She will be transferred to the CT simulation room for planning later this morning.                             ALLERGIES:  is allergic to latex.   Physical Findings:   height is 5\' 3"  (1.6 m) and weight is 116 lb 11.2 oz (52.935 kg). Her oral temperature is 98 F (36.7 C). Her blood pressure is 119/71 and her pulse is 96. Her respiration is 12. .    Lab Findings: Lab Results  Component Value Date   WBC 3.8* 08/16/2014   HGB 12.0 08/16/2014   HCT 37.0 08/16/2014   MCV 93.2 08/16/2014   PLT 275 08/16/2014      Plan:  Proceed to CT simulation for planning  ____________________________________ Blair Promise, MD

## 2014-08-21 NOTE — Progress Notes (Signed)
  Radiation Oncology         [phone] ________________________________  Name: Annette Tucker MRN: [id number]  Date: 08/21/2014  DOB: Aug 24, 1945  SIMULATION AND TREATMENT PLANNING NOTE HDR BRACHYTHERAPY  DIAGNOSIS:  FIGO stage IA high-grade endometrial serous carcinoma   NARRATIVE:  The patient was brought to the [place] suite.  Identity was confirmed.  All relevant records and images related to the planned course of therapy were reviewed.  The patient freely provided informed written consent to proceed with treatment after reviewing the details related to the planned course of therapy. The consent form was witnessed and verified by the simulation staff.  Then, the patient was set-up in a stable reproducible  supine position for radiation therapy.  the patient's custom vaginal cylinder was placed in the proximal vagina. This was affixed to the CT/MR stabilization plate. To prevent slippage.  CT images were obtained.  Surface markings were placed.  The CT images were loaded into the planning software.  Then the target and avoidance structures were contoured.  Treatment planning then occurred.  The radiation prescription was entered and confirmed.   I have requested : Brachytherapy Isodose Plan and Dosimetry Calculations to plan the radiation distribution.    PLAN:  The patient will receive 30 Gy in 5 fractions.  The patient will be treated with iridium 192 as the high-dose-rate source. The patient will be treated with a 2.5 cm diameter cylinder. Treatment length will be 3.5 cm. Prescription will be to the vaginal mucosal surface, 6 Gy per treatment.    ________________________________  Blair Promise, PhD, MD

## 2014-08-22 ENCOUNTER — Other Ambulatory Visit: Payer: Self-pay

## 2014-08-22 DIAGNOSIS — C541 Malignant neoplasm of endometrium: Secondary | ICD-10-CM

## 2014-08-23 ENCOUNTER — Other Ambulatory Visit (HOSPITAL_BASED_OUTPATIENT_CLINIC_OR_DEPARTMENT_OTHER): Payer: Medicare HMO

## 2014-08-23 ENCOUNTER — Ambulatory Visit (HOSPITAL_BASED_OUTPATIENT_CLINIC_OR_DEPARTMENT_OTHER): Payer: Medicare HMO

## 2014-08-23 DIAGNOSIS — Z5111 Encounter for antineoplastic chemotherapy: Secondary | ICD-10-CM

## 2014-08-23 DIAGNOSIS — C541 Malignant neoplasm of endometrium: Secondary | ICD-10-CM

## 2014-08-23 LAB — CBC WITH DIFFERENTIAL/PLATELET
BASO%: 0 % (ref 0.0–2.0)
Basophils Absolute: 0 10*3/uL (ref 0.0–0.1)
EOS%: 0 % (ref 0.0–7.0)
Eosinophils Absolute: 0 10*3/uL (ref 0.0–0.5)
HCT: 35.5 % (ref 34.8–46.6)
HGB: 12.2 g/dL (ref 11.6–15.9)
LYMPH%: 6.7 % — ABNORMAL LOW (ref 14.0–49.7)
MCH: 31.4 pg (ref 25.1–34.0)
MCHC: 34.4 g/dL (ref 31.5–36.0)
MCV: 91.5 fL (ref 79.5–101.0)
MONO#: 0.2 10*3/uL (ref 0.1–0.9)
MONO%: 4.4 % (ref 0.0–14.0)
NEUT#: 4.4 10*3/uL (ref 1.5–6.5)
NEUT%: 88.9 % — ABNORMAL HIGH (ref 38.4–76.8)
Platelets: 275 10*3/uL (ref 145–400)
RBC: 3.88 10*6/uL (ref 3.70–5.45)
RDW: 15.1 % — ABNORMAL HIGH (ref 11.2–14.5)
WBC: 5 10*3/uL (ref 3.9–10.3)
lymph#: 0.3 10*3/uL — ABNORMAL LOW (ref 0.9–3.3)

## 2014-08-23 LAB — COMPREHENSIVE METABOLIC PANEL (CC13)
ALT: 15 U/L (ref 0–55)
AST: 15 U/L (ref 5–34)
Albumin: 3.8 g/dL (ref 3.5–5.0)
Alkaline Phosphatase: 67 U/L (ref 40–150)
Anion Gap: 10 mEq/L (ref 3–11)
BUN: 17 mg/dL (ref 7.0–26.0)
CO2: 21 mEq/L — ABNORMAL LOW (ref 22–29)
Calcium: 9.5 mg/dL (ref 8.4–10.4)
Chloride: 106 mEq/L (ref 98–109)
Creatinine: 0.8 mg/dL (ref 0.6–1.1)
EGFR: 80 mL/min/{1.73_m2} — ABNORMAL LOW (ref >90)
Glucose: 183 mg/dl — ABNORMAL HIGH (ref 70–140)
Potassium: 4.4 mEq/L (ref 3.5–5.1)
Sodium: 137 mEq/L (ref 136–145)
Total Bilirubin: 0.25 mg/dL (ref 0.20–1.20)
Total Protein: 6.7 g/dL (ref 6.4–8.3)

## 2014-08-23 MED ORDER — ONDANSETRON 16 MG/50ML IVPB (CHCC)
16.0000 mg | Freq: Once | INTRAVENOUS | Status: AC
  Administered 2014-08-23: 16 mg via INTRAVENOUS

## 2014-08-23 MED ORDER — SODIUM CHLORIDE 0.9 % IV SOLN
430.0000 mg | Freq: Once | INTRAVENOUS | Status: AC
  Administered 2014-08-23: 430 mg via INTRAVENOUS
  Filled 2014-08-23: qty 43

## 2014-08-23 MED ORDER — DIPHENHYDRAMINE HCL 50 MG/ML IJ SOLN
INTRAMUSCULAR | Status: AC
  Filled 2014-08-23: qty 1

## 2014-08-23 MED ORDER — DEXAMETHASONE SODIUM PHOSPHATE 20 MG/5ML IJ SOLN
INTRAMUSCULAR | Status: AC
  Filled 2014-08-23: qty 5

## 2014-08-23 MED ORDER — DEXAMETHASONE SODIUM PHOSPHATE 20 MG/5ML IJ SOLN
20.0000 mg | Freq: Once | INTRAMUSCULAR | Status: AC
  Administered 2014-08-23: 20 mg via INTRAVENOUS

## 2014-08-23 MED ORDER — SODIUM CHLORIDE 0.9 % IV SOLN
Freq: Once | INTRAVENOUS | Status: AC
  Administered 2014-08-23: 10:00:00 via INTRAVENOUS

## 2014-08-23 MED ORDER — DIPHENHYDRAMINE HCL 50 MG/ML IJ SOLN
50.0000 mg | Freq: Once | INTRAMUSCULAR | Status: AC
  Administered 2014-08-23: 50 mg via INTRAVENOUS

## 2014-08-23 MED ORDER — PACLITAXEL CHEMO INJECTION 300 MG/50ML
80.0000 mg/m2 | Freq: Once | INTRAVENOUS | Status: AC
  Administered 2014-08-23: 126 mg via INTRAVENOUS
  Filled 2014-08-23: qty 21

## 2014-08-23 MED ORDER — FAMOTIDINE IN NACL 20-0.9 MG/50ML-% IV SOLN
20.0000 mg | Freq: Once | INTRAVENOUS | Status: AC
  Administered 2014-08-23: 20 mg via INTRAVENOUS

## 2014-08-23 MED ORDER — FAMOTIDINE IN NACL 20-0.9 MG/50ML-% IV SOLN
INTRAVENOUS | Status: AC
  Filled 2014-08-23: qty 50

## 2014-08-23 MED ORDER — ONDANSETRON 16 MG/50ML IVPB (CHCC)
INTRAVENOUS | Status: AC
  Filled 2014-08-23: qty 16

## 2014-08-23 NOTE — Patient Instructions (Signed)
[facility] Discharge Instructions for Patients Receiving Chemotherapy  Today you received the following chemotherapy agents: Taxol and Carboplatin.  To help prevent nausea and vomiting after your treatment, we encourage you to take your nausea medication as prescribed.   If you develop nausea and vomiting that is not controlled by your nausea medication, call the clinic.   BELOW ARE SYMPTOMS THAT SHOULD BE REPORTED IMMEDIATELY:  *FEVER GREATER THAN 100.5 F  *CHILLS WITH OR WITHOUT FEVER  NAUSEA AND VOMITING THAT IS NOT CONTROLLED WITH YOUR NAUSEA MEDICATION  *UNUSUAL SHORTNESS OF BREATH  *UNUSUAL BRUISING OR BLEEDING  TENDERNESS IN MOUTH AND THROAT WITH OR WITHOUT PRESENCE OF ULCERS  *URINARY PROBLEMS  *BOWEL PROBLEMS  UNUSUAL RASH Items with * indicate a potential emergency and should be followed up as soon as possible.  Feel free to call the clinic you have any questions or concerns. The clinic phone number is [phone].    

## 2014-08-24 ENCOUNTER — Ambulatory Visit (HOSPITAL_BASED_OUTPATIENT_CLINIC_OR_DEPARTMENT_OTHER): Payer: Medicare HMO

## 2014-08-24 DIAGNOSIS — C55 Malignant neoplasm of uterus, part unspecified: Secondary | ICD-10-CM

## 2014-08-24 DIAGNOSIS — C541 Malignant neoplasm of endometrium: Secondary | ICD-10-CM

## 2014-08-24 DIAGNOSIS — Z5189 Encounter for other specified aftercare: Secondary | ICD-10-CM

## 2014-08-24 MED ORDER — TBO-FILGRASTIM 300 MCG/0.5ML ~~LOC~~ SOSY
300.0000 ug | PREFILLED_SYRINGE | Freq: Once | SUBCUTANEOUS | Status: AC
  Administered 2014-08-24: 300 ug via SUBCUTANEOUS
  Filled 2014-08-24: qty 0.5

## 2014-08-26 ENCOUNTER — Other Ambulatory Visit: Payer: Self-pay | Admitting: Oncology

## 2014-08-26 DIAGNOSIS — C541 Malignant neoplasm of endometrium: Secondary | ICD-10-CM

## 2014-08-27 ENCOUNTER — Telehealth: Payer: Self-pay | Admitting: *Deleted

## 2014-08-27 NOTE — Telephone Encounter (Signed)
Called patient to remind of HDR Tx. For 08-28-14, spoke with patient and she is aware of this appt.

## 2014-08-28 ENCOUNTER — Ambulatory Visit
Admission: RE | Admit: 2014-08-28 | Discharge: 2014-08-28 | Disposition: A | Discharge status: Home / Self Care | Payer: Medicare HMO | Source: Ambulatory Visit | Attending: Radiation Oncology | Admitting: Radiation Oncology

## 2014-08-28 DIAGNOSIS — C541 Malignant neoplasm of endometrium: Secondary | ICD-10-CM

## 2014-08-28 DIAGNOSIS — Z510 Encounter for antineoplastic radiation therapy: Secondary | ICD-10-CM | POA: Diagnosis not present

## 2014-08-28 NOTE — Progress Notes (Signed)
Expand All Collapse All     Radiation Oncology (336) [phone] ________________________________  Name: Annette Tucker HaireMRN: [id number] Date: 3/1/16DOB: 24-Mar-1946  Vaginal brachytherapy procedure Note  CC: [name], MD Gordy Levan, MD    ICD-9-CM ICD-10-CM   1. Endometrial ca 182.0 C54.1     Diagnosis: FIGO stage IA high-grade endometrial serous carcinoma     Narrative: The patient was taken to the HDR suite and placed on the examination table. A pelvic exam was performed. The vaginal cuff was noted to be intact. No pelvic masses were appreciated.   Patient proceeded to undergo placement of her vaginal cylinder. The optimal diameter to distend the vaginal vault was a 2.5 cm cylinder. Latex free gloves were used during the procedure. A latex free cover was placed over the vaginal cylinder prior to insertion into the patient. Patient tolerated the procedure well.   Simple treatment device Note  Narrative: The patient had construction of her custom vaginal cylinder. The optimal diameter to distend the vaginal vault was a 2.5 cm diameter cylinder. Distally towards the vaginal introitus a 2.0 cm ring was placed    High-dose-rate brachytherapy procedure note   Current Dose: 12 Gy Planned Dose: 30 Gy    Verification simulation note  A fiducial mark was placed within the vaginal cylinder. The patient then proceeded to undergo imaging with an AP and lateral film. This was compared to the patient's planning films documenting accurate position of the vaginal cylinder for treatment.  High-dose-rate brachytherapy treatment  The remote afterloading device was affixed to the vaginal cylinder by catheter system. The patient then proceeded to undergo her second high-dose-rate treatment directed at the proximal vagina. The patient was prescribed a dose of 6 Gy to be delivered  to the mucosal surface. This was achieved with the total dwell time of 252.3 seconds. Patient was treated with 1 channel using 8 dwell positions. The patient tolerated the procedure well. After completion of her therapy a radiation survey was performed documenting return of the iridium source into the Nucletron safe.  ____________________________________ Blair Promise, MD

## 2014-08-30 ENCOUNTER — Other Ambulatory Visit (HOSPITAL_BASED_OUTPATIENT_CLINIC_OR_DEPARTMENT_OTHER): Payer: Medicare HMO

## 2014-08-30 ENCOUNTER — Ambulatory Visit (HOSPITAL_BASED_OUTPATIENT_CLINIC_OR_DEPARTMENT_OTHER): Payer: Medicare HMO

## 2014-08-30 ENCOUNTER — Ambulatory Visit (HOSPITAL_BASED_OUTPATIENT_CLINIC_OR_DEPARTMENT_OTHER): Payer: Medicare HMO | Admitting: Oncology

## 2014-08-30 ENCOUNTER — Encounter: Payer: Self-pay | Admitting: Oncology

## 2014-08-30 ENCOUNTER — Telehealth: Payer: Self-pay | Admitting: Oncology

## 2014-08-30 VITALS — BP 125/81 | HR 97 | Temp 98.0°F | Resp 18 | Ht 63.0 in | Wt 115.6 lb

## 2014-08-30 DIAGNOSIS — C541 Malignant neoplasm of endometrium: Secondary | ICD-10-CM

## 2014-08-30 DIAGNOSIS — I10 Essential (primary) hypertension: Secondary | ICD-10-CM

## 2014-08-30 DIAGNOSIS — D701 Agranulocytosis secondary to cancer chemotherapy: Secondary | ICD-10-CM

## 2014-08-30 DIAGNOSIS — Z5111 Encounter for antineoplastic chemotherapy: Secondary | ICD-10-CM

## 2014-08-30 DIAGNOSIS — T451X5A Adverse effect of antineoplastic and immunosuppressive drugs, initial encounter: Secondary | ICD-10-CM

## 2014-08-30 LAB — CBC WITH DIFFERENTIAL/PLATELET
BASO%: 0 % (ref 0.0–2.0)
Basophils Absolute: 0 10*3/uL (ref 0.0–0.1)
EOS%: 0 % (ref 0.0–7.0)
Eosinophils Absolute: 0 10*3/uL (ref 0.0–0.5)
HCT: 33.2 % — ABNORMAL LOW (ref 34.8–46.6)
HGB: 11.3 g/dL — ABNORMAL LOW (ref 11.6–15.9)
LYMPH%: 9.1 % — ABNORMAL LOW (ref 14.0–49.7)
MCH: 31.4 pg (ref 25.1–34.0)
MCHC: 34 g/dL (ref 31.5–36.0)
MCV: 92.2 fL (ref 79.5–101.0)
MONO#: 0.2 10*3/uL (ref 0.1–0.9)
MONO%: 5.4 % (ref 0.0–14.0)
NEUT#: 2.5 10*3/uL (ref 1.5–6.5)
NEUT%: 85.5 % — ABNORMAL HIGH (ref 38.4–76.8)
Platelets: 230 10*3/uL (ref 145–400)
RBC: 3.6 10*6/uL — ABNORMAL LOW (ref 3.70–5.45)
RDW: 15 % — ABNORMAL HIGH (ref 11.2–14.5)
WBC: 3 10*3/uL — ABNORMAL LOW (ref 3.9–10.3)
lymph#: 0.3 10*3/uL — ABNORMAL LOW (ref 0.9–3.3)

## 2014-08-30 LAB — COMPREHENSIVE METABOLIC PANEL (CC13)
ALT: 12 U/L (ref 0–55)
AST: 16 U/L (ref 5–34)
Albumin: 3.8 g/dL (ref 3.5–5.0)
Alkaline Phosphatase: 66 U/L (ref 40–150)
Anion Gap: 12 mEq/L — ABNORMAL HIGH (ref 3–11)
BUN: 15.3 mg/dL (ref 7.0–26.0)
CO2: 21 mEq/L — ABNORMAL LOW (ref 22–29)
Calcium: 9.5 mg/dL (ref 8.4–10.4)
Chloride: 105 mEq/L (ref 98–109)
Creatinine: 0.7 mg/dL (ref 0.6–1.1)
EGFR: 89 mL/min/{1.73_m2} — ABNORMAL LOW (ref >90)
Glucose: 144 mg/dl — ABNORMAL HIGH (ref 70–140)
Potassium: 4.1 mEq/L (ref 3.5–5.1)
Sodium: 138 mEq/L (ref 136–145)
Total Bilirubin: 0.3 mg/dL (ref 0.20–1.20)
Total Protein: 6.6 g/dL (ref 6.4–8.3)

## 2014-08-30 MED ORDER — SODIUM CHLORIDE 0.9 % IV SOLN
Freq: Once | INTRAVENOUS | Status: AC
  Administered 2014-08-30: 12:00:00 via INTRAVENOUS

## 2014-08-30 MED ORDER — DEXAMETHASONE SODIUM PHOSPHATE 20 MG/5ML IJ SOLN
INTRAMUSCULAR | Status: AC
  Filled 2014-08-30: qty 5

## 2014-08-30 MED ORDER — ONDANSETRON 8 MG/50ML IVPB (CHCC)
8.0000 mg | Freq: Once | INTRAVENOUS | Status: AC
  Administered 2014-08-30: 8 mg via INTRAVENOUS

## 2014-08-30 MED ORDER — ONDANSETRON 8 MG/NS 50 ML IVPB
INTRAVENOUS | Status: AC
Start: 2014-08-30 — End: 2014-08-30
  Filled 2014-08-30: qty 8

## 2014-08-30 MED ORDER — DEXAMETHASONE SODIUM PHOSPHATE 20 MG/5ML IJ SOLN
20.0000 mg | Freq: Once | INTRAMUSCULAR | Status: AC
  Administered 2014-08-30: 20 mg via INTRAVENOUS

## 2014-08-30 MED ORDER — DIPHENHYDRAMINE HCL 50 MG/ML IJ SOLN
INTRAMUSCULAR | Status: AC
  Filled 2014-08-30: qty 1

## 2014-08-30 MED ORDER — FAMOTIDINE IN NACL 20-0.9 MG/50ML-% IV SOLN
20.0000 mg | Freq: Once | INTRAVENOUS | Status: AC
Start: 2014-08-30 — End: 2014-08-30
  Administered 2014-08-30: 20 mg via INTRAVENOUS

## 2014-08-30 MED ORDER — DIPHENHYDRAMINE HCL 50 MG/ML IJ SOLN
50.0000 mg | Freq: Once | INTRAMUSCULAR | Status: AC
  Administered 2014-08-30: 50 mg via INTRAVENOUS

## 2014-08-30 MED ORDER — ONDANSETRON 16 MG/50ML IVPB (CHCC)
INTRAVENOUS | Status: AC
  Filled 2014-08-30: qty 16

## 2014-08-30 MED ORDER — FAMOTIDINE IN NACL 20-0.9 MG/50ML-% IV SOLN
INTRAVENOUS | Status: AC
  Filled 2014-08-30: qty 50

## 2014-08-30 MED ORDER — PACLITAXEL CHEMO INJECTION 300 MG/50ML
80.0000 mg/m2 | Freq: Once | INTRAVENOUS | Status: AC
  Administered 2014-08-30: 126 mg via INTRAVENOUS
  Filled 2014-08-30: qty 21

## 2014-08-30 NOTE — Progress Notes (Signed)
OFFICE PROGRESS NOTE   August 30, 2014   Physicians:P.Gehrig, J.Kinard, M.J.Baxley, K.Fogleman, H.Gruber, Jyothi [place]en, (Maureen Jarrell)  INTERVAL HISTORY:  Patient is seen, alone for visit, in continuing attention to adjuvant chemotherapy in process for IA serous endometrial carcinoma, due day 8 cycle 3 today. She continues to tolerate the chemotherapy generally well, with granix support day after each chemo.  Patient had more aching after taxol last week, improved with hot shower and heating pad. She had minimal peripheral neuropathy in left hand and left foot just after that treatment, resolved now. Bowels have been variable, but good BM today. She notices more metallic taste after chemo, is able to eat and is very aware of drinking fluids. She has had 2 of 5 planned HDR treatments   No PAC Flu vaccine done  ONCOLOGIC HISTORY Patient has had chronic low back pain which continued 01-2014, with new vaginal spotting then. She saw Dr Pamala Hurry, with exam finding very atrophic vagina and hemorrhoids. With continued spotting whe had [place] which showed 4x3x2 cm uterus with 4.8 mm endometrial stripe and 2 small lesions felt to be polyps. D and C 05-17-14 ([id number] found high grade endometrial carcinoma with serous features. CT AP and CXR 05-31-14 showed no evidence of metastatic disease. She was seen by Dr Alycia Rossetti and taken to robotic assisted hysterectomy BSO pelvic and paraaortic nodes 06-05-14. Pathology [phone]) had superficially invasive serous carcinoma, stage IA. Post operative course was uncomplicated. Patient discussed pathology findings and recommendations with Dr Alycia Rossetti by phone, and is to see her for post operative follow up visit on 07-19-14. Dr Alycia Rossetti recommended adjuvant taxol carboplatin + vaginal brachytherapy. First carbo taxol given 07-12-14, dose dense regimen chosen as patient very anxious about possible chemotherapy side effects. Granix added day after each treatment after day 15 cycle 1  held for neutropenia.   Review of systems as above, also: No fever or symptoms of infection. No bleeding. No SOB. Hair has come out. Remainder of 10 point Review of Systems negative.  Objective:  Vital signs in last 24 hours:  BP 125/81 mmHg  Pulse 97  Temp(Src) 98 F (36.7 C) (Oral)  Resp 18  Ht _0  (1.6 m)  Wt 115 lb 9.6 oz (52.436 kg)  BMI 20.48 kg/m2 Weight down 1 lb Alert, oriented and appropriate. Ambulatory without difficulty.  Alopecia  HEENT:PERRL, sclerae not icteric. Oral mucosa moist without lesions, posterior pharynx clear.  Neck supple. No JVD.  Lymphatics:no cervical,supraclavicular or inguinal adenopathy Resp: clear to auscultation bilaterally and normal percussion bilaterally Cardio: regular rate and rhythm. No gallop. GI: soft, nontender, not distended, no mass or organomegaly. Decreased bowel sounds. Surgical incisions not remarkable. Musculoskeletal/ Extremities: without pitting edema, cords, tenderness Neuro: no peripheral neuropathy. Otherwise nonfocal. PSYCH appropriate mood and affect Skin without rash, ecchymosis, petechiae   Lab Results:  Results for orders placed or performed in visit on 08/30/14  CBC with Differential  Result Value Ref Range   WBC 3.0 (L) 3.9 - 10.3 10e3/uL   NEUT# 2.5 1.5 - 6.5 10e3/uL   HGB 11.3 (L) 11.6 - 15.9 g/dL   HCT 33.2 (L) 34.8 - 46.6 %   Platelets 230 145 - 400 10e3/uL   MCV 92.2 79.5 - 101.0 fL   MCH 31.4 25.1 - 34.0 pg   MCHC 34.0 31.5 - 36.0 g/dL   RBC 3.60 (L) 3.70 - 5.45 10e6/uL   RDW 15.0 (H) 11.2 - 14.5 %   lymph# 0.3 (L) 0.9 - 3.3 10e3/uL  MONO# 0.2 0.1 - 0.9 10e3/uL   Eosinophils Absolute 0.0 0.0 - 0.5 10e3/uL   Basophils Absolute 0.0 0.0 - 0.1 10e3/uL   NEUT% 85.5 (H) 38.4 - 76.8 %   LYMPH% 9.1 (L) 14.0 - 49.7 %   MONO% 5.4 0.0 - 14.0 %   EOS% 0.0 0.0 - 7.0 %   BASO% 0.0 0.0 - 2.0 %  Comprehensive metabolic panel (Cmet) - [facility]  Result Value Ref Range   Sodium 138 136 - 145 mEq/L    Potassium 4.1 3.5 - 5.1 mEq/L   Chloride 105 98 - 109 mEq/L   CO2 21 (L) 22 - 29 mEq/L   Glucose 144 (H) 70 - 140 mg/dl   BUN 15.3 7.0 - 26.0 mg/dL   Creatinine 0.7 0.6 - 1.1 mg/dL   Total Bilirubin 0.30 0.20 - 1.20 mg/dL   Alkaline Phosphatase 66 40 - 150 U/L   AST 16 5 - 34 U/L   ALT 12 0 - 55 U/L   Total Protein 6.6 6.4 - 8.3 g/dL   Albumin 3.8 3.5 - 5.0 g/dL   Calcium 9.5 8.4 - 10.4 mg/dL   Anion Gap 12 (H) 3 - 11 mEq/L   EGFR 89 (L) >90 ml/min/1.73 m2     Studies/Results: None new  Medications: I have reviewed the patient's current medications. No refills needed today  DISCUSSION: interval history reviewed as above. She is in agreement with continuing treatment as planned.  Assessment/Plan: 1.IA high grade serous endometrial carcinoma: post robotic assisted hysterectomy with BSO and bilateral pelvic and para aortic node evaluation 06-05-2014. Continuing adjuvant chemotherapy, day 8 cycle 3 today, gCSF support. Vaginal brachytherapy being given concomitantly 2. Latex allergy, no rashes presently 3.Poor tolerance of steroids: decrease in premed decadron to 16 mg 12 hrs prior; IV decadron will still be 20 mg with each premed. 4. bladder sensation improving out further from surgery 5.left renal cyst on CT, without concerning features 6.post benign biopsy left breast 1990s, up to date on mammograms  7.benign skin lesions, followed yearly by Dr Hedy Jacob with that visit upcoming 8. Up to date colonoscopy 9.atherosclerosis by CT 10.lumbar spondylosis with some chronic back discomfort 11.hypertension, elevated lipids followed by PCP 12. flu vaccine done   Chemo and granix orders confirmed. Time spent 25 min including >50% counseling and coordination of care.    Deion Forgue P, MD   08/30/2014, 11:24 AM

## 2014-08-30 NOTE — Patient Instructions (Signed)
Paclitaxel injection What is this medicine? PACLITAXEL (PAK li TAX el) is a chemotherapy drug. It targets fast dividing cells, like cancer cells, and causes these cells to die. This medicine is used to treat ovarian cancer, breast cancer, and other cancers. This medicine may be used for other purposes; ask your health care provider or pharmacist if you have questions. COMMON BRAND NAME(S): Onxol, Taxol What should I tell my health care provider before I take this medicine? They need to know if you have any of these conditions: -blood disorders -irregular heartbeat -infection (especially a virus infection such as chickenpox, cold sores, or herpes) -liver disease -previous or ongoing radiation therapy -an unusual or allergic reaction to paclitaxel, alcohol, polyoxyethylated castor oil, other chemotherapy agents, other medicines, foods, dyes, or preservatives -pregnant or trying to get pregnant -breast-feeding How should I use this medicine? This drug is given as an infusion into a vein. It is administered in a hospital or clinic by a specially trained health care professional. Talk to your pediatrician regarding the use of this medicine in children. Special care may be needed. Overdosage: If you think you have taken too much of this medicine contact a poison control center or emergency room at once. NOTE: This medicine is only for you. Do not share this medicine with others. What if I miss a dose? It is important not to miss your dose. Call your doctor or health care professional if you are unable to keep an appointment. What may interact with this medicine? Do not take this medicine with any of the following medications: -disulfiram -metronidazole This medicine may also interact with the following medications: -cyclosporine -diazepam -ketoconazole -medicines to increase blood counts like filgrastim, pegfilgrastim, sargramostim -other chemotherapy drugs like cisplatin, doxorubicin,  epirubicin, etoposide, teniposide, vincristine -quinidine -testosterone -vaccines -verapamil Talk to your doctor or health care professional before taking any of these medicines: -acetaminophen -aspirin -ibuprofen -ketoprofen -naproxen This list may not describe all possible interactions. Give your health care provider a list of all the medicines, herbs, non-prescription drugs, or dietary supplements you use. Also tell them if you smoke, drink alcohol, or use illegal drugs. Some items may interact with your medicine. What should I watch for while using this medicine? Your condition will be monitored carefully while you are receiving this medicine. You will need important blood work done while you are taking this medicine. This drug may make you feel generally unwell. This is not uncommon, as chemotherapy can affect healthy cells as well as cancer cells. Report any side effects. Continue your course of treatment even though you feel ill unless your doctor tells you to stop. In some cases, you may be given additional medicines to help with side effects. Follow all directions for their use. Call your doctor or health care professional for advice if you get a fever, chills or sore throat, or other symptoms of a cold or flu. Do not treat yourself. This drug decreases your body's ability to fight infections. Try to avoid being around people who are sick. This medicine may increase your risk to bruise or bleed. Call your doctor or health care professional if you notice any unusual bleeding. Be careful brushing and flossing your teeth or using a toothpick because you may get an infection or bleed more easily. If you have any dental work done, tell your dentist you are receiving this medicine. Avoid taking products that contain aspirin, acetaminophen, ibuprofen, naproxen, or ketoprofen unless instructed by your doctor. These medicines may hide a fever.   Do not become pregnant while taking this medicine.  Women should inform their doctor if they wish to become pregnant or think they might be pregnant. There is a potential for serious side effects to an unborn child. Talk to your health care professional or pharmacist for more information. Do not breast-feed an infant while taking this medicine. Men are advised not to father a child while receiving this medicine. What side effects may I notice from receiving this medicine? Side effects that you should report to your doctor or health care professional as soon as possible: -allergic reactions like skin rash, itching or hives, swelling of the face, lips, or tongue -low blood counts - This drug may decrease the number of white blood cells, red blood cells and platelets. You may be at increased risk for infections and bleeding. -signs of infection - fever or chills, cough, sore throat, pain or difficulty passing urine -signs of decreased platelets or bleeding - bruising, pinpoint red spots on the skin, black, tarry stools, nosebleeds -signs of decreased red blood cells - unusually weak or tired, fainting spells, lightheadedness -breathing problems -chest pain -high or low blood pressure -mouth sores -nausea and vomiting -pain, swelling, redness or irritation at the injection site -pain, tingling, numbness in the hands or feet -slow or irregular heartbeat -swelling of the ankle, feet, hands Side effects that usually do not require medical attention (report to your doctor or health care professional if they continue or are bothersome): -bone pain -complete hair loss including hair on your head, underarms, pubic hair, eyebrows, and eyelashes -changes in the color of fingernails -diarrhea -loosening of the fingernails -loss of appetite -muscle or joint pain -red flush to skin -sweating This list may not describe all possible side effects. Call your doctor for medical advice about side effects. You may report side effects to FDA at  1-800-FDA-1088. Where should I keep my medicine? This drug is given in a hospital or clinic and will not be stored at home. NOTE: This sheet is a summary. It may not cover all possible information. If you have questions about this medicine, talk to your doctor, pharmacist, or health care provider.  2015, [employer]. (2012-08-08 16:41:21)  

## 2014-08-30 NOTE — Telephone Encounter (Signed)
, °

## 2014-08-31 ENCOUNTER — Ambulatory Visit (HOSPITAL_BASED_OUTPATIENT_CLINIC_OR_DEPARTMENT_OTHER): Payer: Medicare HMO

## 2014-08-31 ENCOUNTER — Other Ambulatory Visit: Payer: Self-pay | Admitting: Oncology

## 2014-08-31 DIAGNOSIS — Z5189 Encounter for other specified aftercare: Secondary | ICD-10-CM

## 2014-08-31 DIAGNOSIS — C55 Malignant neoplasm of uterus, part unspecified: Secondary | ICD-10-CM

## 2014-08-31 MED ORDER — TBO-FILGRASTIM 300 MCG/0.5ML ~~LOC~~ SOSY
300.0000 ug | PREFILLED_SYRINGE | Freq: Once | SUBCUTANEOUS | Status: AC
  Administered 2014-08-31: 300 ug via SUBCUTANEOUS
  Filled 2014-08-31: qty 0.5

## 2014-09-03 ENCOUNTER — Telehealth: Payer: Self-pay | Admitting: *Deleted

## 2014-09-03 NOTE — Telephone Encounter (Signed)
CALLED PATIENT TO REMIND OF HDR [place]. FOR 09-04-14 @ 9 AM, LVM FOR A RETURN CALL

## 2014-09-04 ENCOUNTER — Ambulatory Visit
Admission: RE | Admit: 2014-09-04 | Discharge: 2014-09-04 | Disposition: A | Discharge status: Home / Self Care | Payer: Medicare HMO | Source: Ambulatory Visit | Attending: Radiation Oncology | Admitting: Radiation Oncology

## 2014-09-04 DIAGNOSIS — Z510 Encounter for antineoplastic radiation therapy: Secondary | ICD-10-CM | POA: Diagnosis not present

## 2014-09-04 DIAGNOSIS — C55 Malignant neoplasm of uterus, part unspecified: Secondary | ICD-10-CM

## 2014-09-04 NOTE — Progress Notes (Signed)
  Name: Annette Tucker: [id number] Date: 3/8/16DOB: [birth year]  Vaginal brachytherapy procedure Note  CC: Annette Showers, MD Gordy Levan, MD    ICD-9-CM ICD-10-CM   1. Endometrial ca 182.0 C54.1     Diagnosis: FIGO stage IA high-grade endometrial serous carcinoma     Narrative: The patient was taken to the HDR suite and placed on the examination table. A pelvic exam was performed. The vaginal cuff was noted to be intact. No pelvic masses were appreciated.   Patient proceeded to undergo placement of her vaginal cylinder. The optimal diameter to distend the vaginal vault was a 2.5 cm cylinder. Latex free gloves were used during the procedure. A latex free cover was placed over the vaginal cylinder prior to insertion into the patient. Patient tolerated the procedure well.   Simple treatment device Note  Narrative: The patient had construction of her custom vaginal cylinder. The optimal diameter to distend the vaginal vault was a 2.5 cm diameter cylinder. Distally towards the vaginal introitus a 2.0 cm ring was placed    High-dose-rate brachytherapy procedure note   Current Dose: 18 Gy Planned Dose: 30 Gy    Verification simulation note  A fiducial mark was placed within the vaginal cylinder. The patient then proceeded to undergo imaging with an AP and lateral film. This was compared to the patient's planning films documenting accurate position of the vaginal cylinder for treatment.  High-dose-rate brachytherapy treatment  The remote afterloading device was affixed to the vaginal cylinder by catheter system. The patient then proceeded to undergo her third high-dose-rate treatment directed at the proximal vagina. The patient was prescribed a dose of 6 Gy to be delivered to the mucosal surface. This was achieved with the total dwell time of 269.5 seconds. Patient was  treated with 1 channel using 8 dwell positions. The patient tolerated the procedure well. After completion of her therapy a radiation survey was performed documenting return of the iridium source into the [employer] safe.  ____________________________________ Blair Promise, MD

## 2014-09-05 ENCOUNTER — Telehealth: Payer: Self-pay | Admitting: *Deleted

## 2014-09-05 ENCOUNTER — Other Ambulatory Visit: Payer: Self-pay | Admitting: Oncology

## 2014-09-05 NOTE — Telephone Encounter (Signed)
CALLED PATIENT TO REMIND OF HER HDR [place] 09-06-14 @ 8:45 AM, SPOKE WITH PATIENT AND SHE IS AWARE OF THIS [place].

## 2014-09-06 ENCOUNTER — Ambulatory Visit
Admission: RE | Admit: 2014-09-06 | Discharge: 2014-09-06 | Disposition: A | Discharge status: Home / Self Care | Payer: Medicare HMO | Source: Ambulatory Visit | Attending: Radiation Oncology | Admitting: Radiation Oncology

## 2014-09-06 ENCOUNTER — Ambulatory Visit (HOSPITAL_BASED_OUTPATIENT_CLINIC_OR_DEPARTMENT_OTHER): Payer: Medicare HMO

## 2014-09-06 ENCOUNTER — Other Ambulatory Visit (HOSPITAL_BASED_OUTPATIENT_CLINIC_OR_DEPARTMENT_OTHER): Payer: Medicare HMO

## 2014-09-06 DIAGNOSIS — Z510 Encounter for antineoplastic radiation therapy: Secondary | ICD-10-CM | POA: Diagnosis not present

## 2014-09-06 DIAGNOSIS — C541 Malignant neoplasm of endometrium: Secondary | ICD-10-CM

## 2014-09-06 DIAGNOSIS — C55 Malignant neoplasm of uterus, part unspecified: Secondary | ICD-10-CM

## 2014-09-06 DIAGNOSIS — Z5111 Encounter for antineoplastic chemotherapy: Secondary | ICD-10-CM

## 2014-09-06 LAB — CBC WITH DIFFERENTIAL/PLATELET
BASO%: 0 % (ref 0.0–2.0)
Basophils Absolute: 0 10*3/uL (ref 0.0–0.1)
EOS%: 0 % (ref 0.0–7.0)
Eosinophils Absolute: 0 10*3/uL (ref 0.0–0.5)
HCT: 33.8 % — ABNORMAL LOW (ref 34.8–46.6)
HGB: 11.6 g/dL (ref 11.6–15.9)
LYMPH%: 6.5 % — ABNORMAL LOW (ref 14.0–49.7)
MCH: 31.5 pg (ref 25.1–34.0)
MCHC: 34.3 g/dL (ref 31.5–36.0)
MCV: 91.8 fL (ref 79.5–101.0)
MONO#: 0.3 10*3/uL (ref 0.1–0.9)
MONO%: 7.2 % (ref 0.0–14.0)
NEUT#: 3.9 10*3/uL (ref 1.5–6.5)
NEUT%: 86.3 % — ABNORMAL HIGH (ref 38.4–76.8)
Platelets: 241 10*3/uL (ref 145–400)
RBC: 3.68 10*6/uL — ABNORMAL LOW (ref 3.70–5.45)
RDW: 15.8 % — ABNORMAL HIGH (ref 11.2–14.5)
WBC: 4.5 10*3/uL (ref 3.9–10.3)
lymph#: 0.3 10*3/uL — ABNORMAL LOW (ref 0.9–3.3)

## 2014-09-06 LAB — COMPREHENSIVE METABOLIC PANEL (CC13)
ALT: 22 U/L (ref 0–55)
AST: 19 U/L (ref 5–34)
Albumin: 3.8 g/dL (ref 3.5–5.0)
Alkaline Phosphatase: 67 U/L (ref 40–150)
Anion Gap: 8 mEq/L (ref 3–11)
BUN: 15.2 mg/dL (ref 7.0–26.0)
CO2: 21 mEq/L — ABNORMAL LOW (ref 22–29)
Calcium: 9.7 mg/dL (ref 8.4–10.4)
Chloride: 107 mEq/L (ref 98–109)
Creatinine: 0.7 mg/dL (ref 0.6–1.1)
EGFR: 87 mL/min/{1.73_m2} — ABNORMAL LOW (ref >90)
Glucose: 133 mg/dl (ref 70–140)
Potassium: 4.7 mEq/L (ref 3.5–5.1)
Sodium: 136 mEq/L (ref 136–145)
Total Bilirubin: <0.2 mg/dL (ref 0.20–1.20)
Total Protein: 6.8 g/dL (ref 6.4–8.3)

## 2014-09-06 MED ORDER — DIPHENHYDRAMINE HCL 50 MG/ML IJ SOLN
INTRAMUSCULAR | Status: AC
  Filled 2014-09-06: qty 1

## 2014-09-06 MED ORDER — SODIUM CHLORIDE 0.9 % IV SOLN
Freq: Once | INTRAVENOUS | Status: AC
  Administered 2014-09-06: 11:00:00 via INTRAVENOUS

## 2014-09-06 MED ORDER — FAMOTIDINE IN NACL 20-0.9 MG/50ML-% IV SOLN
20.0000 mg | Freq: Once | INTRAVENOUS | Status: AC
  Administered 2014-09-06: 20 mg via INTRAVENOUS

## 2014-09-06 MED ORDER — DIPHENHYDRAMINE HCL 50 MG/ML IJ SOLN
50.0000 mg | Freq: Once | INTRAMUSCULAR | Status: AC
  Administered 2014-09-06: 50 mg via INTRAVENOUS

## 2014-09-06 MED ORDER — PACLITAXEL CHEMO INJECTION 300 MG/50ML
80.0000 mg/m2 | Freq: Once | INTRAVENOUS | Status: AC
  Administered 2014-09-06: 126 mg via INTRAVENOUS
  Filled 2014-09-06: qty 21

## 2014-09-06 MED ORDER — SODIUM CHLORIDE 0.9 % IV SOLN
Freq: Once | INTRAVENOUS | Status: AC
  Administered 2014-09-06: 11:00:00 via INTRAVENOUS
  Filled 2014-09-06: qty 4

## 2014-09-06 MED ORDER — FAMOTIDINE IN NACL 20-0.9 MG/50ML-% IV SOLN
INTRAVENOUS | Status: AC
  Filled 2014-09-06: qty 50

## 2014-09-06 NOTE — Patient Instructions (Signed)
[facility] Discharge Instructions for Patients Receiving Chemotherapy  Today you received the following chemotherapy agents Taxol  To help prevent nausea and vomiting after your treatment, we encourage you to take your nausea medication    If you develop nausea and vomiting that is not controlled by your nausea medication, call the clinic.   BELOW ARE SYMPTOMS THAT SHOULD BE REPORTED IMMEDIATELY:  *FEVER GREATER THAN 100.5 F  *CHILLS WITH OR WITHOUT FEVER  NAUSEA AND VOMITING THAT IS NOT CONTROLLED WITH YOUR NAUSEA MEDICATION  *UNUSUAL SHORTNESS OF BREATH  *UNUSUAL BRUISING OR BLEEDING  TENDERNESS IN MOUTH AND THROAT WITH OR WITHOUT PRESENCE OF ULCERS  *URINARY PROBLEMS  *BOWEL PROBLEMS  UNUSUAL RASH Items with * indicate a potential emergency and should be followed up as soon as possible.  Feel free to call the clinic you have any questions or concerns. The clinic phone number is [phone].    

## 2014-09-06 NOTE — Progress Notes (Signed)
  Name: Annette Tucker: [id number] Date: 3/1016DOB: 12/07/1945  Vaginal brachytherapy procedure Note  CC: [name], MD Gordy Levan, MD    ICD-9-CM ICD-10-CM   1. Endometrial ca 182.0 C54.1     Diagnosis: FIGO stage IA high-grade endometrial serous carcinoma     Narrative: The patient was taken to the HDR suite and placed on the examination table. A pelvic exam was performed. The vaginal cuff was noted to be intact. No pelvic masses were appreciated.   Patient proceeded to undergo placement of her vaginal cylinder. The optimal diameter to distend the vaginal vault was a 2.5 cm cylinder. Latex free gloves were used during the procedure. A latex free cover was placed over the vaginal cylinder prior to insertion into the patient. Patient tolerated the procedure well.   Simple treatment device Note  Narrative: The patient had construction of her custom vaginal cylinder. The optimal diameter to distend the vaginal vault was a 2.5 cm diameter cylinder. Distally towards the vaginal introitus a 2.0 cm ring was placed    High-dose-rate brachytherapy procedure note   Current Dose: 24 Gy Planned Dose: 30 Gy    Verification simulation note  A fiducial mark was placed within the vaginal cylinder. The patient then proceeded to undergo imaging with an AP and lateral film. This was compared to the patient's planning films documenting accurate position of the vaginal cylinder for treatment.  High-dose-rate brachytherapy treatment  The remote afterloading device was affixed to the vaginal cylinder by catheter system. The patient then proceeded to undergo her fourth high-dose-rate treatment directed at the proximal vagina. The patient was prescribed a dose of 6 Gy to be delivered to the mucosal surface. This was achieved with the total dwell time of 274.5 seconds.  Patient was treated with 1 channel using 8 dwell positions. The patient tolerated the procedure well. After completion of her therapy a radiation survey was performed documenting return of the iridium source into the Gamma[employer] safe.  ____________________________________ Blair Promise, MD

## 2014-09-07 ENCOUNTER — Ambulatory Visit (HOSPITAL_BASED_OUTPATIENT_CLINIC_OR_DEPARTMENT_OTHER): Payer: Medicare HMO

## 2014-09-07 DIAGNOSIS — Z5189 Encounter for other specified aftercare: Secondary | ICD-10-CM

## 2014-09-07 DIAGNOSIS — C55 Malignant neoplasm of uterus, part unspecified: Secondary | ICD-10-CM

## 2014-09-07 MED ORDER — TBO-FILGRASTIM 300 MCG/0.5ML ~~LOC~~ SOSY
300.0000 ug | PREFILLED_SYRINGE | Freq: Once | SUBCUTANEOUS | Status: AC
  Administered 2014-09-07: 300 ug via SUBCUTANEOUS
  Filled 2014-09-07: qty 0.5

## 2014-09-11 ENCOUNTER — Encounter: Payer: Self-pay | Admitting: Radiation Oncology

## 2014-09-13 ENCOUNTER — Ambulatory Visit (HOSPITAL_BASED_OUTPATIENT_CLINIC_OR_DEPARTMENT_OTHER): Payer: Medicare HMO

## 2014-09-13 ENCOUNTER — Telehealth: Payer: Self-pay | Admitting: Oncology

## 2014-09-13 ENCOUNTER — Other Ambulatory Visit (HOSPITAL_BASED_OUTPATIENT_CLINIC_OR_DEPARTMENT_OTHER): Payer: Medicare HMO

## 2014-09-13 ENCOUNTER — Encounter: Payer: Self-pay | Admitting: Oncology

## 2014-09-13 ENCOUNTER — Ambulatory Visit (HOSPITAL_BASED_OUTPATIENT_CLINIC_OR_DEPARTMENT_OTHER): Payer: Medicare HMO | Admitting: Oncology

## 2014-09-13 VITALS — BP 121/81 | HR 99 | Temp 98.0°F | Resp 18 | Ht 63.0 in | Wt 115.0 lb

## 2014-09-13 DIAGNOSIS — Z5111 Encounter for antineoplastic chemotherapy: Secondary | ICD-10-CM

## 2014-09-13 DIAGNOSIS — D701 Agranulocytosis secondary to cancer chemotherapy: Secondary | ICD-10-CM

## 2014-09-13 DIAGNOSIS — C541 Malignant neoplasm of endometrium: Secondary | ICD-10-CM

## 2014-09-13 DIAGNOSIS — T451X5A Adverse effect of antineoplastic and immunosuppressive drugs, initial encounter: Secondary | ICD-10-CM

## 2014-09-13 DIAGNOSIS — R110 Nausea: Secondary | ICD-10-CM

## 2014-09-13 DIAGNOSIS — C55 Malignant neoplasm of uterus, part unspecified: Secondary | ICD-10-CM

## 2014-09-13 LAB — CBC WITH DIFFERENTIAL/PLATELET
BASO%: 0.3 % (ref 0.0–2.0)
Basophils Absolute: 0 10*3/uL (ref 0.0–0.1)
EOS%: 0 % (ref 0.0–7.0)
Eosinophils Absolute: 0 10*3/uL (ref 0.0–0.5)
HCT: 33.4 % — ABNORMAL LOW (ref 34.8–46.6)
HGB: 11.4 g/dL — ABNORMAL LOW (ref 11.6–15.9)
LYMPH%: 6.4 % — ABNORMAL LOW (ref 14.0–49.7)
MCH: 31.6 pg (ref 25.1–34.0)
MCHC: 34.1 g/dL (ref 31.5–36.0)
MCV: 92.5 fL (ref 79.5–101.0)
MONO#: 0.1 10*3/uL (ref 0.1–0.9)
MONO%: 3.4 % (ref 0.0–14.0)
NEUT#: 2.7 10*3/uL (ref 1.5–6.5)
NEUT%: 89.9 % — ABNORMAL HIGH (ref 38.4–76.8)
Platelets: 150 10*3/uL (ref 145–400)
RBC: 3.61 10*6/uL — ABNORMAL LOW (ref 3.70–5.45)
RDW: 16.5 % — ABNORMAL HIGH (ref 11.2–14.5)
WBC: 3 10*3/uL — ABNORMAL LOW (ref 3.9–10.3)
lymph#: 0.2 10*3/uL — ABNORMAL LOW (ref 0.9–3.3)

## 2014-09-13 LAB — COMPREHENSIVE METABOLIC PANEL (CC13)
ALT: 17 U/L (ref 0–55)
AST: 14 U/L (ref 5–34)
Albumin: 3.9 g/dL (ref 3.5–5.0)
Alkaline Phosphatase: 67 U/L (ref 40–150)
Anion Gap: 12 mEq/L — ABNORMAL HIGH (ref 3–11)
BUN: 14.2 mg/dL (ref 7.0–26.0)
CO2: 20 mEq/L — ABNORMAL LOW (ref 22–29)
Calcium: 9.2 mg/dL (ref 8.4–10.4)
Chloride: 105 mEq/L (ref 98–109)
Creatinine: 0.7 mg/dL (ref 0.6–1.1)
EGFR: 86 mL/min/{1.73_m2} — ABNORMAL LOW (ref >90)
Glucose: 184 mg/dl — ABNORMAL HIGH (ref 70–140)
Potassium: 4.3 mEq/L (ref 3.5–5.1)
Sodium: 137 mEq/L (ref 136–145)
Total Bilirubin: 0.28 mg/dL (ref 0.20–1.20)
Total Protein: 6.8 g/dL (ref 6.4–8.3)

## 2014-09-13 MED ORDER — SODIUM CHLORIDE 0.9 % IV SOLN
430.0000 mg | Freq: Once | INTRAVENOUS | Status: AC
  Administered 2014-09-13: 430 mg via INTRAVENOUS
  Filled 2014-09-13: qty 43

## 2014-09-13 MED ORDER — FAMOTIDINE IN NACL 20-0.9 MG/50ML-% IV SOLN
20.0000 mg | Freq: Once | INTRAVENOUS | Status: AC
  Administered 2014-09-13: 20 mg via INTRAVENOUS

## 2014-09-13 MED ORDER — DIPHENHYDRAMINE HCL 50 MG/ML IJ SOLN
50.0000 mg | Freq: Once | INTRAMUSCULAR | Status: AC
  Administered 2014-09-13: 50 mg via INTRAVENOUS

## 2014-09-13 MED ORDER — SODIUM CHLORIDE 0.9 % IV SOLN
Freq: Once | INTRAVENOUS | Status: AC
  Administered 2014-09-13: 11:00:00 via INTRAVENOUS
  Filled 2014-09-13: qty 8

## 2014-09-13 MED ORDER — DIPHENHYDRAMINE HCL 50 MG/ML IJ SOLN
INTRAMUSCULAR | Status: AC
  Filled 2014-09-13: qty 1

## 2014-09-13 MED ORDER — SODIUM CHLORIDE 0.9 % IV SOLN
Freq: Once | INTRAVENOUS | Status: AC
  Administered 2014-09-13: 11:00:00 via INTRAVENOUS

## 2014-09-13 MED ORDER — FAMOTIDINE IN NACL 20-0.9 MG/50ML-% IV SOLN
INTRAVENOUS | Status: AC
  Filled 2014-09-13: qty 50

## 2014-09-13 MED ORDER — PACLITAXEL CHEMO INJECTION 300 MG/50ML
80.0000 mg/m2 | Freq: Once | INTRAVENOUS | Status: AC
  Administered 2014-09-13: 126 mg via INTRAVENOUS
  Filled 2014-09-13: qty 21

## 2014-09-13 NOTE — Progress Notes (Signed)
OFFICE PROGRESS NOTE   September 13, 2014   Physicians:P.Gehrig, J.Kinard, M.J.Baxley, K.Fogleman, H.Gruber, Jyothi [place], Nunzio Cobbs)  INTERVAL HISTORY:   Patient is seen, alone for visit, continuing adjuvant dose dense carboplatin taxol for IA serous endometrial carcinoma, due day 1 cycle 4 today. She is receiving concomitant vaginal brachytherapy, nearly completed. She is tolerating treatment well overall.   Patient has been able to drink fluids consistently well, including [employer] Instant Breakfast, tho appetite for solid food is less for a few days after carboplatin. Nausea is controlled with regular antiemetics for several doses after each treatment. Bowels still do not move well for ~ 2 days after each chemo, but otherwise fine with 1 colace, magnesium tablet and prunes, which she prefers to other medication. She has no persistent peripheral neuropathy. Energy is adequate to walk on treadmill for 1.5 miles daily.    No PAC Flu vaccine done  ONCOLOGIC HISTORY Patient has had chronic low back pain which continued 01-2014, with new vaginal spotting then. She saw Dr Pamala Hurry, with exam finding very atrophic vagina and hemorrhoids. With continued spotting whe had [place] which showed 4x3x2 cm uterus with 4.8 mm endometrial stripe and 2 small lesions felt to be polyps. D and C 05-17-14 ([id number] found high grade endometrial carcinoma with serous features. CT AP and CXR 05-31-14 showed no evidence of metastatic disease. She was seen by Dr Alycia Rossetti and taken to robotic assisted hysterectomy BSO pelvic and paraaortic nodes 06-05-14. Pathology [id number]) had superficially invasive serous carcinoma, stage IA. Post operative course was uncomplicated. Patient discussed pathology findings and recommendations with Dr Alycia Rossetti by phone, and is to see her for post operative follow up visit on 07-19-14. Dr Alycia Rossetti recommended adjuvant taxol carboplatin + vaginal brachytherapy. First carbo taxol given 07-12-14, dose  dense regimen chosen as patient very anxious about possible chemotherapy side effects. Granix added day after each treatment after day 15 cycle 1 held for neutropenia.    Review of systems as above, also: No fever or symptoms of infection. No GERD. No bleeding. She does not find peripheral IV access difficult. Bladder ok. No LE swelling Remainder of 10 point Review of Systems negative.  Objective:  Vital signs in last 24 hours:  BP 121/81 mmHg  Pulse 99  Temp(Src) 98 F (36.7 C) (Oral)  Resp 18  Ht '5\' 3"'  (1.6 m)  Wt 115 lb (52.164 kg)  BMI 20.38 kg/m2  SpO2 99% Weight down 0.5 lb. Looks comfortable, talkative and in good spirits, pleased that she is now over halfway done. Alert, oriented and appropriate. Ambulatory without difficulty.  Alopecia  HEENT:PERRL, sclerae not icteric. Oral mucosa moist without lesions, posterior pharynx clear.  Neck supple. No JVD.  Lymphatics:no cervical,supraclavicular,  or inguinal adenopathy Resp: clear to auscultation bilaterally and normal percussion bilaterally Cardio: regular rate and rhythm. No gallop. GI: soft, nontender, not distended, no mass or organomegaly. Normally active bowel sounds. Surgical incisions not remarkable. Musculoskeletal/ Extremities: without pitting edema, cords, tenderness Neuro: no significant peripheral neuropathy. Otherwise nonfocal Skin without rash, ecchymosis, petechiae   Lab Results:  Results for orders placed or performed in visit on 09/13/14  CBC with Differential  Result Value Ref Range   WBC 3.0 (L) 3.9 - 10.3 10e3/uL   NEUT# 2.7 1.5 - 6.5 10e3/uL   HGB 11.4 (L) 11.6 - 15.9 g/dL   HCT 33.4 (L) 34.8 - 46.6 %   Platelets 150 145 - 400 10e3/uL   MCV 92.5 79.5 - 101.0 fL   MCH 31.6  25.1 - 34.0 pg   MCHC 34.1 31.5 - 36.0 g/dL   RBC 3.61 (L) 3.70 - 5.45 10e6/uL   RDW 16.5 (H) 11.2 - 14.5 %   lymph# 0.2 (L) 0.9 - 3.3 10e3/uL   MONO# 0.1 0.1 - 0.9 10e3/uL   Eosinophils Absolute 0.0 0.0 - 0.5 10e3/uL    Basophils Absolute 0.0 0.0 - 0.1 10e3/uL   NEUT% 89.9 (H) 38.4 - 76.8 %   LYMPH% 6.4 (L) 14.0 - 49.7 %   MONO% 3.4 0.0 - 14.0 %   EOS% 0.0 0.0 - 7.0 %   BASO% 0.3 0.0 - 2.0 %  Comprehensive metabolic panel (Cmet) - [facility]  Result Value Ref Range   Sodium 137 136 - 145 mEq/L   Potassium 4.3 3.5 - 5.1 mEq/L   Chloride 105 98 - 109 mEq/L   CO2 20 (L) 22 - 29 mEq/L   Glucose 184 (H) 70 - 140 mg/dl   BUN 14.2 7.0 - 26.0 mg/dL   Creatinine 0.7 0.6 - 1.1 mg/dL   Total Bilirubin 0.28 0.20 - 1.20 mg/dL   Alkaline Phosphatase 67 40 - 150 U/L   AST 14 5 - 34 U/L   ALT 17 0 - 55 U/L   Total Protein 6.8 6.4 - 8.3 g/dL   Albumin 3.9 3.5 - 5.0 g/dL   Calcium 9.2 8.4 - 10.4 mg/dL   Anion Gap 12 (H) 3 - 11 mEq/L   EGFR 86 (L) >90 ml/min/1.73 m2    CBC reviewed with patient at visit Studies/Results:  No results found.  Medications: I have reviewed the patient's current medications. Continue present regimen.   DISCUSSION: patient is satisfied with how she is tolerating chemotherapy and in agreement with continuing as planned. Granix can be given just under < 24 hours from chemotherapy per information from our financial staff.  Assessment/Plan:  1.IA high grade serous endometrial carcinoma: post robotic assisted hysterectomy with BSO and bilateral pelvic and para aortic node evaluation 06-05-2014. Continuing adjuvant chemotherapy, day 1 cycle 4 today, gCSF support. Vaginal brachytherapy being given concomitantly 2. Latex allergy, no rashes presently 3.Tolerating premed decadron at 16 mg 12 hrs prior now; IV decadron will still be 20 mg with each premed. 4. bladder sensation improving out further from surgery 5.left renal cyst on CT, without concerning features 6.post benign biopsy left breast 1990s, up to date on mammograms  7.benign skin lesions, followed yearly by Dr Hedy Jacob  8. Up to date colonoscopy 9.atherosclerosis by CT 10.lumbar spondylosis with some chronic back  discomfort 11.hypertension, elevated lipids followed by PCP 12. flu vaccine done   Chemo and granix orders confirmed. Time spent 25 min including >50% counseling and coordination of care. She knows to call between scheduled appointments if needed.   LIVESAY,LENNIS P, MD   09/13/2014, 10:23 AM

## 2014-09-13 NOTE — Patient Instructions (Signed)
[facility] Discharge Instructions for Patients Receiving Chemotherapy  Today you received the following chemotherapy agents Taxol and Carboplatin.  To help prevent nausea and vomiting after your treatment, we encourage you to take your nausea medication.   If you develop nausea and vomiting that is not controlled by your nausea medication, call the clinic.   BELOW ARE SYMPTOMS THAT SHOULD BE REPORTED IMMEDIATELY:  *FEVER GREATER THAN 100.5 F  *CHILLS WITH OR WITHOUT FEVER  NAUSEA AND VOMITING THAT IS NOT CONTROLLED WITH YOUR NAUSEA MEDICATION  *UNUSUAL SHORTNESS OF BREATH  *UNUSUAL BRUISING OR BLEEDING  TENDERNESS IN MOUTH AND THROAT WITH OR WITHOUT PRESENCE OF ULCERS  *URINARY PROBLEMS  *BOWEL PROBLEMS  UNUSUAL RASH Items with * indicate a potential emergency and should be followed up as soon as possible.  Feel free to call the clinic you have any questions or concerns. The clinic phone number is [phone].  Please show the [place] at check to the Emergency Department and triage nurse.

## 2014-09-13 NOTE — Telephone Encounter (Signed)
appoinments made and  avs printed for pt  Annette Tucker

## 2014-09-14 ENCOUNTER — Ambulatory Visit (HOSPITAL_BASED_OUTPATIENT_CLINIC_OR_DEPARTMENT_OTHER): Payer: Medicare HMO

## 2014-09-14 DIAGNOSIS — Z5189 Encounter for other specified aftercare: Secondary | ICD-10-CM

## 2014-09-14 DIAGNOSIS — C541 Malignant neoplasm of endometrium: Secondary | ICD-10-CM

## 2014-09-14 DIAGNOSIS — C55 Malignant neoplasm of uterus, part unspecified: Secondary | ICD-10-CM

## 2014-09-14 MED ORDER — TBO-FILGRASTIM 300 MCG/0.5ML ~~LOC~~ SOSY
300.0000 ug | PREFILLED_SYRINGE | Freq: Once | SUBCUTANEOUS | Status: AC
  Administered 2014-09-14: 300 ug via SUBCUTANEOUS
  Filled 2014-09-14: qty 0.5

## 2014-09-17 ENCOUNTER — Telehealth: Payer: Self-pay | Admitting: *Deleted

## 2014-09-17 NOTE — Telephone Encounter (Signed)
CALLED PATIENT TO REMIND OF HDR [place] 09-18-14 @ 9 AM, SPOKE WITH PATIENT AND SHE IS AWARE OF THIS TREATMENT

## 2014-09-18 ENCOUNTER — Encounter: Payer: Self-pay | Admitting: Radiation Oncology

## 2014-09-18 ENCOUNTER — Ambulatory Visit
Admission: RE | Admit: 2014-09-18 | Discharge: 2014-09-18 | Disposition: A | Discharge status: Home / Self Care | Payer: Medicare HMO | Source: Ambulatory Visit | Attending: Radiation Oncology | Admitting: Radiation Oncology

## 2014-09-18 DIAGNOSIS — Z510 Encounter for antineoplastic radiation therapy: Secondary | ICD-10-CM | POA: Diagnosis not present

## 2014-09-18 DIAGNOSIS — C55 Malignant neoplasm of uterus, part unspecified: Secondary | ICD-10-CM

## 2014-09-18 NOTE — Progress Notes (Signed)
  Name: Annette Tucker: [id number] Date: 3/22/16DOB: 1945-11-25  Vaginal brachytherapy procedure Note  CC: [name], MD Gordy Levan, MD    ICD-9-CM ICD-10-CM   1. Endometrial ca 182.0 C54.1     Diagnosis: FIGO stage IA high-grade endometrial serous carcinoma     Narrative: The patient was taken to the HDR suite and placed on the examination table. A pelvic exam was performed. The vaginal cuff was noted to be intact. No pelvic masses were appreciated.   Patient proceeded to undergo placement of her vaginal cylinder. The optimal diameter to distend the vaginal vault was a 2.5 cm cylinder. Latex free gloves were used during the procedure. A latex free cover was placed over the vaginal cylinder prior to insertion into the patient. Patient tolerated the procedure well.   Simple treatment device Note  Narrative: The patient had construction of her custom vaginal cylinder. The optimal diameter to distend the vaginal vault was a 2.5 cm diameter cylinder. Distally towards the vaginal introitus a 2.0 cm ring was placed    High-dose-rate brachytherapy procedure note   Current Dose: 30 Gy Planned Dose: 30 Gy    Verification simulation note  A fiducial mark was placed within the vaginal cylinder. The patient then proceeded to undergo imaging with an AP and lateral film. This was compared to the patient's planning films documenting accurate position of the vaginal cylinder for treatment.  High-dose-rate brachytherapy treatment  The remote afterloading device was affixed to the vaginal cylinder by catheter system. The patient then proceeded to undergo her fifth high-dose-rate treatment directed at the proximal vagina. The patient was prescribed a dose of 6 Gy to be delivered to the mucosal surface. This was achieved with the total dwell time of 307.3  seconds. Patient was treated with 1 channel using 8 dwell positions. The patient tolerated the procedure well. After completion of her therapy a radiation survey was performed documenting return of the iridium source into the [employer] safe.  ____________________________________ Blair Promise, MD

## 2014-09-19 ENCOUNTER — Other Ambulatory Visit: Payer: Self-pay | Admitting: Oncology

## 2014-09-19 DIAGNOSIS — C541 Malignant neoplasm of endometrium: Secondary | ICD-10-CM

## 2014-09-20 ENCOUNTER — Ambulatory Visit (HOSPITAL_BASED_OUTPATIENT_CLINIC_OR_DEPARTMENT_OTHER): Payer: Medicare HMO

## 2014-09-20 ENCOUNTER — Other Ambulatory Visit (HOSPITAL_BASED_OUTPATIENT_CLINIC_OR_DEPARTMENT_OTHER): Payer: Medicare HMO

## 2014-09-20 DIAGNOSIS — Z5189 Encounter for other specified aftercare: Secondary | ICD-10-CM | POA: Diagnosis not present

## 2014-09-20 DIAGNOSIS — C541 Malignant neoplasm of endometrium: Secondary | ICD-10-CM

## 2014-09-20 DIAGNOSIS — C55 Malignant neoplasm of uterus, part unspecified: Secondary | ICD-10-CM

## 2014-09-20 LAB — COMPREHENSIVE METABOLIC PANEL (CC13)
ALT: 16 U/L (ref 0–55)
AST: 16 U/L (ref 5–34)
Albumin: 3.8 g/dL (ref 3.5–5.0)
Alkaline Phosphatase: 63 U/L (ref 40–150)
Anion Gap: 9 mEq/L (ref 3–11)
BUN: 15.5 mg/dL (ref 7.0–26.0)
CO2: 23 mEq/L (ref 22–29)
Calcium: 9.4 mg/dL (ref 8.4–10.4)
Chloride: 104 mEq/L (ref 98–109)
Creatinine: 0.7 mg/dL (ref 0.6–1.1)
EGFR: 89 mL/min/{1.73_m2} — ABNORMAL LOW (ref >90)
Glucose: 147 mg/dl — ABNORMAL HIGH (ref 70–140)
Potassium: 4.3 mEq/L (ref 3.5–5.1)
Sodium: 137 mEq/L (ref 136–145)
Total Bilirubin: 0.36 mg/dL (ref 0.20–1.20)
Total Protein: 6.6 g/dL (ref 6.4–8.3)

## 2014-09-20 LAB — CBC WITH DIFFERENTIAL/PLATELET
BASO%: 0.1 % (ref 0.0–2.0)
Basophils Absolute: 0 10*3/uL (ref 0.0–0.1)
EOS%: 0 % (ref 0.0–7.0)
Eosinophils Absolute: 0 10*3/uL (ref 0.0–0.5)
HCT: 32.6 % — ABNORMAL LOW (ref 34.8–46.6)
HGB: 10.8 g/dL — ABNORMAL LOW (ref 11.6–15.9)
LYMPH%: 10.9 % — ABNORMAL LOW (ref 14.0–49.7)
MCH: 31.2 pg (ref 25.1–34.0)
MCHC: 33.1 g/dL (ref 31.5–36.0)
MCV: 94.2 fL (ref 79.5–101.0)
MONO#: 0.1 10*3/uL (ref 0.1–0.9)
MONO%: 7 % (ref 0.0–14.0)
NEUT#: 1.3 10*3/uL — ABNORMAL LOW (ref 1.5–6.5)
NEUT%: 82 % — ABNORMAL HIGH (ref 38.4–76.8)
Platelets: 118 10*3/uL — ABNORMAL LOW (ref 145–400)
RBC: 3.46 10*6/uL — ABNORMAL LOW (ref 3.70–5.45)
RDW: 17.7 % — ABNORMAL HIGH (ref 11.2–14.5)
WBC: 1.6 10*3/uL — ABNORMAL LOW (ref 3.9–10.3)
lymph#: 0.2 10*3/uL — ABNORMAL LOW (ref 0.9–3.3)

## 2014-09-20 MED ORDER — TBO-FILGRASTIM 300 MCG/0.5ML ~~LOC~~ SOSY
300.0000 ug | PREFILLED_SYRINGE | Freq: Once | SUBCUTANEOUS | Status: AC
  Administered 2014-09-20: 300 ug via SUBCUTANEOUS
  Filled 2014-09-20: qty 0.5

## 2014-09-20 NOTE — Progress Notes (Signed)
0900-WBC-1.6 and ANC-1.3 from today's CBC results.  Dr. Marko Plume notified and order received to hold chemo today and to give Granix today.  Neutropenic precautions reviewed with pt.  Teach back done.

## 2014-09-20 NOTE — Patient Instructions (Signed)
Neutropenia Neutropenia is a condition that occurs when the level of a certain type of white blood cell (neutrophil) in your body becomes lower than normal. Neutrophils are made in the bone marrow and fight infections. These cells protect against bacteria and viruses. The fewer neutrophils you have, and the longer your body remains without them, the greater your risk of getting a severe infection becomes. CAUSES  The cause of neutropenia may be hard to determine. However, it is usually due to 3 main problems:   Decreased production of neutrophils. This may be due to:  Certain medicines such as chemotherapy.  Genetic problems.  Cancer.  Radiation treatments.  Vitamin deficiency.  Some pesticides.  Increased destruction of neutrophils. This may be due to:  Overwhelming infections.  Hemolytic anemia. This is when the body destroys its own blood cells.  Chemotherapy.  Neutrophils moving to areas of the body where they cannot fight infections. This may be due to:  Dialysis procedures.  Conditions where the spleen becomes enlarged. Neutrophils are held in the spleen and are not available to the rest of the body.  Overwhelming infections. The neutrophils are held in the area of the infection and are not available to the rest of the body. SYMPTOMS  There are no specific symptoms of neutropenia. The lack of neutrophils can result in an infection, and an infection can cause various problems. DIAGNOSIS  Diagnosis is made by a blood test. A complete blood count is performed. The normal level of neutrophils in human blood differs with age and race. Infants have lower counts than older children and adults. African Americans have lower counts than Caucasians or Asians. The average adult level is 1500 cells/mm3 of blood. Neutrophil counts are interpreted as follows:  Greater than 1000 cells/mm3 gives normal protection against infection.  500 to 1000 cells/mm3 gives an increased risk for  infection.  200 to 500 cells/mm3 is a greater risk for severe infection.  Lower than 200 cells/mm3 is a marked risk of infection. This may require hospitalization and treatment with antibiotic medicines. TREATMENT  Treatment depends on the underlying cause, severity, and presence of infections or symptoms. It also depends on your health. Your caregiver will discuss the treatment plan with you. Mild cases are often easily treated and have a good outcome. Preventative measures may also be started to limit your risk of infections. Treatment can include:  Taking antibiotics.  Stopping medicines that are known to cause neutropenia.  Correcting nutritional deficiencies by eating green vegetables to supply folic acid and taking vitamin B supplements.  Stopping exposure to pesticides if your neutropenia is related to pesticide exposure.  Taking a blood growth factor called sargramostim, pegfilgrastim, or filgrastim if you are undergoing chemotherapy for cancer. This stimulates white blood cell production.  Removal of the spleen if you have Felty's syndrome and have repeated infections. HOME CARE INSTRUCTIONS   Follow your caregiver's instructions about when you need to have blood work done.  Wash your hands often. Make sure others who come in contact with you also wash their hands.  Wash raw fruits and vegetables before eating them. They can carry bacteria and fungi.  Avoid people with colds or spreadable (contagious) diseases (chickenpox, herpes zoster, influenza).  Avoid large crowds.  Avoid construction areas. The dust can release fungus into the air.  Be cautious around children in daycare or school environments.  Take care of your respiratory system by coughing and deep breathing.  Bathe daily.  Protect your skin from cuts and   burns.  Do not work in the garden or with flowers and plants.  Care for the mouth before and after meals by brushing with a soft toothbrush. If you have  mucositis, do not use mouthwash. Mouthwash contains alcohol and can dry out the mouth even more.  Clean the area between the genitals and the anus (perineal area) after urination and bowel movements. Women need to wipe from front to back.  Use a water soluble lubricant during sexual intercourse and practice good hygiene after. Do not have intercourse if you are severely neutropenic. Check with your caregiver for guidelines.  Exercise daily as tolerated.  Avoid people who were vaccinated with a live vaccine in the past 30 days. You should not receive live vaccines (polio, typhoid).  Do not provide direct care for pets. Avoid animal droppings. Do not clean litter boxes and bird cages.  Do not share food utensils.  Do not use tampons, enemas, or rectal suppositories unless directed by your caregiver.  Use an electric razor to remove hair.  Wash your hands after handling magazines, letters, and newspapers. SEEK IMMEDIATE MEDICAL CARE IF:   You have a fever.  You have chills or start to shake.  You feel nauseous or vomit.  You develop mouth sores.  You develop aches and pains.  You have redness and swelling around open wounds.  Your skin is warm to the touch.  You have pus coming from your wounds.  You develop swollen lymph nodes.  You feel weak or fatigued.  You develop red streaks on the skin. MAKE SURE YOU:  Understand these instructions.  Will watch your condition.  Will get help right away if you are not doing well or get worse. Document Released: 12/05/2001 Document Revised: 09/07/2011 Document Reviewed: 01/02/2011 Exit[employer] Patient Information 2015 [employer]. This information is not intended to replace advice given to you by your health care provider. Make sure you discuss any questions you have with your health care provider.  

## 2014-09-21 ENCOUNTER — Ambulatory Visit: Payer: Self-pay

## 2014-09-23 ENCOUNTER — Other Ambulatory Visit: Payer: Self-pay | Admitting: Oncology

## 2014-09-27 ENCOUNTER — Other Ambulatory Visit (HOSPITAL_BASED_OUTPATIENT_CLINIC_OR_DEPARTMENT_OTHER): Payer: Medicare HMO

## 2014-09-27 ENCOUNTER — Encounter: Payer: Self-pay | Admitting: Oncology

## 2014-09-27 ENCOUNTER — Telehealth: Payer: Self-pay | Admitting: Oncology

## 2014-09-27 ENCOUNTER — Ambulatory Visit (HOSPITAL_BASED_OUTPATIENT_CLINIC_OR_DEPARTMENT_OTHER): Payer: Medicare HMO | Admitting: Oncology

## 2014-09-27 ENCOUNTER — Other Ambulatory Visit: Payer: Self-pay | Admitting: Oncology

## 2014-09-27 ENCOUNTER — Ambulatory Visit (HOSPITAL_BASED_OUTPATIENT_CLINIC_OR_DEPARTMENT_OTHER): Payer: Medicare HMO

## 2014-09-27 VITALS — BP 117/73 | HR 96 | Temp 98.8°F | Resp 18 | Ht 63.0 in | Wt 113.3 lb

## 2014-09-27 DIAGNOSIS — T451X5A Adverse effect of antineoplastic and immunosuppressive drugs, initial encounter: Secondary | ICD-10-CM

## 2014-09-27 DIAGNOSIS — Z5111 Encounter for antineoplastic chemotherapy: Secondary | ICD-10-CM

## 2014-09-27 DIAGNOSIS — D6481 Anemia due to antineoplastic chemotherapy: Secondary | ICD-10-CM | POA: Diagnosis not present

## 2014-09-27 DIAGNOSIS — C541 Malignant neoplasm of endometrium: Secondary | ICD-10-CM

## 2014-09-27 DIAGNOSIS — C55 Malignant neoplasm of uterus, part unspecified: Secondary | ICD-10-CM

## 2014-09-27 DIAGNOSIS — D701 Agranulocytosis secondary to cancer chemotherapy: Secondary | ICD-10-CM

## 2014-09-27 LAB — CBC WITH DIFFERENTIAL/PLATELET
BASO%: 0.6 % (ref 0.0–2.0)
Basophils Absolute: 0 10*3/uL (ref 0.0–0.1)
EOS%: 0 % (ref 0.0–7.0)
Eosinophils Absolute: 0 10*3/uL (ref 0.0–0.5)
HCT: 32.3 % — ABNORMAL LOW (ref 34.8–46.6)
HGB: 11.1 g/dL — ABNORMAL LOW (ref 11.6–15.9)
LYMPH%: 14.2 % (ref 14.0–49.7)
MCH: 32.1 pg (ref 25.1–34.0)
MCHC: 34.4 g/dL (ref 31.5–36.0)
MCV: 93.4 fL (ref 79.5–101.0)
MONO#: 0.3 10*3/uL (ref 0.1–0.9)
MONO%: 8.5 % (ref 0.0–14.0)
NEUT#: 2.4 10*3/uL (ref 1.5–6.5)
NEUT%: 76.7 % (ref 38.4–76.8)
Platelets: 148 10*3/uL (ref 145–400)
RBC: 3.46 10*6/uL — ABNORMAL LOW (ref 3.70–5.45)
RDW: 17.4 % — ABNORMAL HIGH (ref 11.2–14.5)
WBC: 3.2 10*3/uL — ABNORMAL LOW (ref 3.9–10.3)
lymph#: 0.5 10*3/uL — ABNORMAL LOW (ref 0.9–3.3)

## 2014-09-27 LAB — COMPREHENSIVE METABOLIC PANEL (CC13)
ALT: 18 U/L (ref 0–55)
AST: 19 U/L (ref 5–34)
Albumin: 3.7 g/dL (ref 3.5–5.0)
Alkaline Phosphatase: 64 U/L (ref 40–150)
Anion Gap: 15 mEq/L — ABNORMAL HIGH (ref 3–11)
BUN: 15.7 mg/dL (ref 7.0–26.0)
CO2: 22 mEq/L (ref 22–29)
Calcium: 9.9 mg/dL (ref 8.4–10.4)
Chloride: 101 mEq/L (ref 98–109)
Creatinine: 0.8 mg/dL (ref 0.6–1.1)
EGFR: 79 mL/min/{1.73_m2} — ABNORMAL LOW (ref >90)
Glucose: 166 mg/dl — ABNORMAL HIGH (ref 70–140)
Potassium: 3.9 mEq/L (ref 3.5–5.1)
Sodium: 138 mEq/L (ref 136–145)
Total Bilirubin: 0.29 mg/dL (ref 0.20–1.20)
Total Protein: 7.3 g/dL (ref 6.4–8.3)

## 2014-09-27 MED ORDER — SODIUM CHLORIDE 0.9 % IV SOLN
Freq: Once | INTRAVENOUS | Status: AC
  Administered 2014-09-27: 10:00:00 via INTRAVENOUS

## 2014-09-27 MED ORDER — SODIUM CHLORIDE 0.9 % IV SOLN
50.0000 mg | Freq: Once | INTRAVENOUS | Status: AC
  Administered 2014-09-27: 50 mg via INTRAVENOUS
  Filled 2014-09-27: qty 1

## 2014-09-27 MED ORDER — FAMOTIDINE IN NACL 20-0.9 MG/50ML-% IV SOLN
INTRAVENOUS | Status: AC
Start: 2014-09-27 — End: 2014-09-27
  Filled 2014-09-27: qty 50

## 2014-09-27 MED ORDER — PACLITAXEL CHEMO INJECTION 300 MG/50ML
80.0000 mg/m2 | Freq: Once | INTRAVENOUS | Status: AC
  Administered 2014-09-27: 126 mg via INTRAVENOUS
  Filled 2014-09-27: qty 21

## 2014-09-27 MED ORDER — FAMOTIDINE IN NACL 20-0.9 MG/50ML-% IV SOLN
20.0000 mg | Freq: Once | INTRAVENOUS | Status: AC
  Administered 2014-09-27: 20 mg via INTRAVENOUS

## 2014-09-27 MED ORDER — SODIUM CHLORIDE 0.9 % IV SOLN
Freq: Once | INTRAVENOUS | Status: AC
  Administered 2014-09-27: 10:00:00 via INTRAVENOUS
  Filled 2014-09-27: qty 4

## 2014-09-27 NOTE — Telephone Encounter (Signed)
Appointments made and avs printed for pateint   Annette Tucker

## 2014-09-27 NOTE — Patient Instructions (Signed)
[facility] Discharge Instructions for Patients Receiving Chemotherapy  Today you received the following chemotherapy agent: Taxol   To help prevent nausea and vomiting after your treatment, we encourage you to take your nausea medication as prescribed.    If you develop nausea and vomiting that is not controlled by your nausea medication, call the clinic.   BELOW ARE SYMPTOMS THAT SHOULD BE REPORTED IMMEDIATELY:  *FEVER GREATER THAN 100.5 F  *CHILLS WITH OR WITHOUT FEVER  NAUSEA AND VOMITING THAT IS NOT CONTROLLED WITH YOUR NAUSEA MEDICATION  *UNUSUAL SHORTNESS OF BREATH  *UNUSUAL BRUISING OR BLEEDING  TENDERNESS IN MOUTH AND THROAT WITH OR WITHOUT PRESENCE OF ULCERS  *URINARY PROBLEMS  *BOWEL PROBLEMS  UNUSUAL RASH Items with * indicate a potential emergency and should be followed up as soon as possible.  Feel free to call the clinic you have any questions or concerns. The clinic phone number is [phone].  Please show the CHEMO ALERT CARD at check-in to the Emergency Department and triage nurse.   

## 2014-09-27 NOTE — Progress Notes (Signed)
OFFICE PROGRESS NOTE   September 27, 2014   Physicians:P.Gehrig, J.Kinard, M.J.Baxley, K.Fogleman, H.Gruber, Jyothi Mann, (Maureen Jarrell)  INTERVAL HISTORY:  Patient is seen, alone for visit, in continuing attention to adjuvant dose dense carboplatin taxol for IA serous endometrial carcinoma, due day 15 cycle 4 today. Day 8 cycle 4 was held with ANC 1.3, counts recovered today and will continue granix support. Vaginal brachytherapy completed 09-18-14.   Patient is feeling a little better with short chemo break. Sweaty during night last pm. Taste better but not normal in this short chemo break. Smells of food are more bothersome than taste for few days after treatment - discussed cold foods for less odors. Bowels loose yesterday x1, have not moved today. Minimal neuropathy mostly left foot, notices more at night, not interfering with activity.   No PAC Flu vaccine done  ONCOLOGIC HISTORY Patient has had chronic low back pain which continued 01-2014, with new vaginal spotting then. She saw Dr Annette Tucker, with exam finding very atrophic vagina and hemorrhoids. With continued spotting whe had [place] which showed 4x3x2 cm uterus with 4.8 mm endometrial stripe and 2 small lesions felt to be polyps. D and C 05-17-14 ([id number] found high grade endometrial carcinoma with serous features. CT AP and CXR 05-31-14 showed no evidence of metastatic disease. She was seen by Dr Annette Tucker and taken to robotic assisted hysterectomy BSO pelvic and paraaortic nodes 06-05-14. Pathology [phone]) had superficially invasive serous carcinoma, stage IA. Post operative course was uncomplicated. Patient discussed pathology findings and recommendations with Dr Annette Tucker by phone, and is to see her for post operative follow up visit on 07-19-14. Dr Annette Tucker recommended adjuvant taxol carboplatin + vaginal brachytherapy. First carbo taxol given 07-12-14, dose dense regimen chosen as patient very anxious about possible chemotherapy side effects.  Granix added day after each treatment after day 15 cycle 1 held for neutropenia.     Review of systems as above, also: No fever. No SOB. Some environmental allergy symptoms, better with steroids and benadryl used with chemo. Bladder fine.  Remainder of 10 point Review of Systems negative.  Objective:  Vital signs in last 24 hours:  BP 117/73 mmHg  Pulse 96  Temp(Src) 98.8 F (37.1 C) (Oral)  Resp 18  Ht 5' 3" (1.6 m)  Wt 113 lb 4.8 oz (51.393 kg)  BMI 20.08 kg/m2 Weight down 2 lbs. Alert, oriented and appropriate. Ambulatory without difficulty. Slightly diaphoretic Alopecia  HEENT:PERRL, sclerae not icteric. Oral mucosa moist without lesions, posterior pharynx clear.  Neck supple. No JVD.  Lymphatics:no cervical,supraclavicular, axillary or inguinal adenopathy Resp: clear to auscultation bilaterally and normal percussion bilaterally Cardio: regular rate and rhythm. No gallop. GI: soft, nontender, not distended, no mass or organomegaly. Normally active bowel sounds. Surgical incision not remarkable. Musculoskeletal/ Extremities: without pitting edema, cords, tenderness Neuro: no significant peripheral neuropathy. Otherwise nonfocal. PSYCH appropriate mood and affect Skin without rash, ecchymosis, petechiae   Lab Results:  Results for orders placed or performed in visit on 09/27/14  CBC with Differential  Result Value Ref Range   WBC 3.2 (L) 3.9 - 10.3 10e3/uL   NEUT# 2.4 1.5 - 6.5 10e3/uL   HGB 11.1 (L) 11.6 - 15.9 g/dL   HCT 32.3 (L) 34.8 - 46.6 %   Platelets 148 145 - 400 10e3/uL   MCV 93.4 79.5 - 101.0 fL   MCH 32.1 25.1 - 34.0 pg   MCHC 34.4 31.5 - 36.0 g/dL   RBC 3.46 (L) 3.70 - 5.45 10e6/uL  RDW 17.4 (H) 11.2 - 14.5 %   lymph# 0.5 (L) 0.9 - 3.3 10e3/uL   MONO# 0.3 0.1 - 0.9 10e3/uL   Eosinophils Absolute 0.0 0.0 - 0.5 10e3/uL   Basophils Absolute 0.0 0.0 - 0.1 10e3/uL   NEUT% 76.7 38.4 - 76.8 %   LYMPH% 14.2 14.0 - 49.7 %   MONO% 8.5 0.0 - 14.0 %   EOS%  0.0 0.0 - 7.0 %   BASO% 0.6 0.0 - 2.0 %  Comprehensive metabolic panel (Cmet) - [facility]  Result Value Ref Range   Sodium 138 136 - 145 mEq/L   Potassium 3.9 3.5 - 5.1 mEq/L   Chloride 101 98 - 109 mEq/L   CO2 22 22 - 29 mEq/L   Glucose 166 (H) 70 - 140 mg/dl   BUN 15.7 7.0 - 26.0 mg/dL   Creatinine 0.8 0.6 - 1.1 mg/dL   Total Bilirubin 0.29 0.20 - 1.20 mg/dL   Alkaline Phosphatase 64 40 - 150 U/L   AST 19 5 - 34 U/L   ALT 18 0 - 55 U/L   Total Protein 7.3 6.4 - 8.3 g/dL   Albumin 3.7 3.5 - 5.0 g/dL   Calcium 9.9 8.4 - 10.4 mg/dL   Anion Gap 15 (H) 3 - 11 mEq/L   EGFR 79 (L) >90 ml/min/1.73 m2  Counts reviewed as above  Studies/Results:  No results found.  Medications: I have reviewed the patient's current medications.  DISCUSSION: OK to go to her bible study group as long as counts ok.  Will not make up day 8 cycle 4 from last week, but continue as scheduled from here. She is in agreement with continuing treatment as planned  Assessment/Plan: 1.IA high grade serous endometrial carcinoma: post robotic assisted hysterectomy with BSO and bilateral pelvic and para aortic node evaluation 06-05-2014. Continuing adjuvant chemotherapy, day 15 cycle 4 today, gCSF support. Vaginal brachytherapy given concomitantly, completed. 2. Latex allergy, no rashes presently 3.Tolerating premed decadron at 16 mg 12 hrs prior now; IV decadron still 20 mg with each premed. 4. bladder sensation improved out further from surgery 5.left renal cyst on CT, without concerning features 6.post benign biopsy left breast 1990s, up to date on mammograms  7.benign skin lesions, followed yearly by Dr Hedy Jacob  8. Up to date colonoscopy 9.atherosclerosis by CT 10.lumbar spondylosis with some chronic back discomfort 11.hypertension, elevated lipids followed by PCP 12. flu vaccine done 13,mild chemo anemia, stable and not symptomatic, follow  Chemo and granix orders confirmed. I will see her ~ every 2 weeks as  scheduled, and she knows to call between scheduled visits if needed. Time spent 25 min including >50% counseling and coordination of care.    Annette Tucker P, MD   09/27/2014, 9:05 AM

## 2014-09-28 ENCOUNTER — Ambulatory Visit (HOSPITAL_BASED_OUTPATIENT_CLINIC_OR_DEPARTMENT_OTHER): Payer: Medicare HMO

## 2014-09-28 DIAGNOSIS — C541 Malignant neoplasm of endometrium: Secondary | ICD-10-CM | POA: Diagnosis not present

## 2014-09-28 DIAGNOSIS — Z5189 Encounter for other specified aftercare: Secondary | ICD-10-CM

## 2014-09-28 DIAGNOSIS — C55 Malignant neoplasm of uterus, part unspecified: Secondary | ICD-10-CM

## 2014-09-28 DIAGNOSIS — T451X5A Adverse effect of antineoplastic and immunosuppressive drugs, initial encounter: Secondary | ICD-10-CM | POA: Insufficient documentation

## 2014-09-28 DIAGNOSIS — D6481 Anemia due to antineoplastic chemotherapy: Secondary | ICD-10-CM | POA: Insufficient documentation

## 2014-09-28 MED ORDER — TBO-FILGRASTIM 300 MCG/0.5ML ~~LOC~~ SOSY
300.0000 ug | PREFILLED_SYRINGE | Freq: Once | SUBCUTANEOUS | Status: AC
  Administered 2014-09-28: 300 ug via SUBCUTANEOUS
  Filled 2014-09-28: qty 0.5

## 2014-09-29 ENCOUNTER — Other Ambulatory Visit: Payer: Self-pay | Admitting: Oncology

## 2014-10-04 ENCOUNTER — Other Ambulatory Visit (HOSPITAL_BASED_OUTPATIENT_CLINIC_OR_DEPARTMENT_OTHER): Payer: Medicare HMO

## 2014-10-04 ENCOUNTER — Telehealth: Payer: Self-pay | Admitting: Oncology

## 2014-10-04 ENCOUNTER — Encounter: Payer: Self-pay | Admitting: Oncology

## 2014-10-04 ENCOUNTER — Other Ambulatory Visit: Payer: Self-pay

## 2014-10-04 ENCOUNTER — Ambulatory Visit: Payer: Self-pay

## 2014-10-04 ENCOUNTER — Ambulatory Visit (HOSPITAL_BASED_OUTPATIENT_CLINIC_OR_DEPARTMENT_OTHER): Payer: Medicare HMO | Admitting: Oncology

## 2014-10-04 ENCOUNTER — Ambulatory Visit (HOSPITAL_BASED_OUTPATIENT_CLINIC_OR_DEPARTMENT_OTHER): Payer: Medicare HMO

## 2014-10-04 VITALS — BP 125/69 | HR 85 | Resp 18

## 2014-10-04 VITALS — BP 123/75 | HR 107 | Temp 98.1°F | Resp 18 | Ht 63.0 in | Wt 114.0 lb

## 2014-10-04 DIAGNOSIS — C541 Malignant neoplasm of endometrium: Secondary | ICD-10-CM

## 2014-10-04 DIAGNOSIS — D6481 Anemia due to antineoplastic chemotherapy: Secondary | ICD-10-CM | POA: Diagnosis not present

## 2014-10-04 DIAGNOSIS — Z5111 Encounter for antineoplastic chemotherapy: Secondary | ICD-10-CM | POA: Diagnosis not present

## 2014-10-04 DIAGNOSIS — T451X5A Adverse effect of antineoplastic and immunosuppressive drugs, initial encounter: Secondary | ICD-10-CM

## 2014-10-04 DIAGNOSIS — D701 Agranulocytosis secondary to cancer chemotherapy: Secondary | ICD-10-CM | POA: Diagnosis not present

## 2014-10-04 DIAGNOSIS — K644 Residual hemorrhoidal skin tags: Secondary | ICD-10-CM

## 2014-10-04 LAB — CBC WITH DIFFERENTIAL/PLATELET
BASO%: 0 % (ref 0.0–2.0)
Basophils Absolute: 0 10*3/uL (ref 0.0–0.1)
EOS%: 0 % (ref 0.0–7.0)
Eosinophils Absolute: 0 10*3/uL (ref 0.0–0.5)
HCT: 32.9 % — ABNORMAL LOW (ref 34.8–46.6)
HGB: 11.1 g/dL — ABNORMAL LOW (ref 11.6–15.9)
LYMPH%: 11.9 % — ABNORMAL LOW (ref 14.0–49.7)
MCH: 32 pg (ref 25.1–34.0)
MCHC: 33.7 g/dL (ref 31.5–36.0)
MCV: 94.8 fL (ref 79.5–101.0)
MONO#: 0.1 10*3/uL (ref 0.1–0.9)
MONO%: 5.9 % (ref 0.0–14.0)
NEUT#: 1.9 10*3/uL (ref 1.5–6.5)
NEUT%: 82.2 % — ABNORMAL HIGH (ref 38.4–76.8)
Platelets: 285 10*3/uL (ref 145–400)
RBC: 3.47 10*6/uL — ABNORMAL LOW (ref 3.70–5.45)
RDW: 17.4 % — ABNORMAL HIGH (ref 11.2–14.5)
WBC: 2.4 10*3/uL — ABNORMAL LOW (ref 3.9–10.3)
lymph#: 0.3 10*3/uL — ABNORMAL LOW (ref 0.9–3.3)

## 2014-10-04 LAB — COMPREHENSIVE METABOLIC PANEL (CC13)
ALT: 15 U/L (ref 0–55)
AST: 16 U/L (ref 5–34)
Albumin: 3.9 g/dL (ref 3.5–5.0)
Alkaline Phosphatase: 67 U/L (ref 40–150)
Anion Gap: 15 mEq/L — ABNORMAL HIGH (ref 3–11)
BUN: 14.2 mg/dL (ref 7.0–26.0)
CO2: 21 mEq/L — ABNORMAL LOW (ref 22–29)
Calcium: 9.7 mg/dL (ref 8.4–10.4)
Chloride: 105 mEq/L (ref 98–109)
Creatinine: 0.8 mg/dL (ref 0.6–1.1)
EGFR: 77 mL/min/{1.73_m2} — ABNORMAL LOW (ref >90)
Glucose: 169 mg/dl — ABNORMAL HIGH (ref 70–140)
Potassium: 4.3 mEq/L (ref 3.5–5.1)
Sodium: 140 mEq/L (ref 136–145)
Total Bilirubin: 0.25 mg/dL (ref 0.20–1.20)
Total Protein: 7 g/dL (ref 6.4–8.3)

## 2014-10-04 MED ORDER — DIPHENHYDRAMINE HCL 50 MG/ML IJ SOLN: 50.0000 mg | Freq: Once | INTRAMUSCULAR | Status: DC

## 2014-10-04 MED ORDER — SODIUM CHLORIDE 0.9 % IV SOLN
Freq: Once | INTRAVENOUS | Status: AC
  Administered 2014-10-04: 09:00:00 via INTRAVENOUS

## 2014-10-04 MED ORDER — FAMOTIDINE IN NACL 20-0.9 MG/50ML-% IV SOLN
20.0000 mg | Freq: Once | INTRAVENOUS | Status: AC
  Administered 2014-10-04: 20 mg via INTRAVENOUS

## 2014-10-04 MED ORDER — SODIUM CHLORIDE 0.9 % IV SOLN
Freq: Once | INTRAVENOUS | Status: AC
  Administered 2014-10-04: 10:00:00 via INTRAVENOUS
  Filled 2014-10-04: qty 50

## 2014-10-04 MED ORDER — CARBOPLATIN CHEMO INJECTION 600 MG/60ML
430.0000 mg | Freq: Once | INTRAVENOUS | Status: AC
  Administered 2014-10-04: 430 mg via INTRAVENOUS
  Filled 2014-10-04: qty 43

## 2014-10-04 MED ORDER — FAMOTIDINE IN NACL 20-0.9 MG/50ML-% IV SOLN
INTRAVENOUS | Status: AC
  Filled 2014-10-04: qty 50

## 2014-10-04 MED ORDER — SODIUM CHLORIDE 0.9 % IV SOLN
Freq: Once | INTRAVENOUS | Status: AC
  Administered 2014-10-04: 10:00:00 via INTRAVENOUS

## 2014-10-04 MED ORDER — SODIUM CHLORIDE 0.9 % IV SOLN
Freq: Once | INTRAVENOUS | Status: DC
  Filled 2014-10-04: qty 8

## 2014-10-04 MED ORDER — DEXTROSE 5 % IV SOLN
80.0000 mg/m2 | Freq: Once | INTRAVENOUS | Status: AC
  Administered 2014-10-04: 126 mg via INTRAVENOUS
  Filled 2014-10-04: qty 21

## 2014-10-04 NOTE — Progress Notes (Signed)
Per Dr. Marko Plume okay to treat with WBC: 2.4 and HR of 107.  Orders received to give patient an additional 500 ml of NS over 1 hour during chemo.

## 2014-10-04 NOTE — Patient Instructions (Signed)
[facility] Discharge Instructions for Patients Receiving Chemotherapy  Today you received the following chemotherapy agents Taxol and Carboplatin. To help prevent nausea and vomiting after your treatment, we encourage you to take your nausea medication as directed.  If you develop nausea and vomiting that is not controlled by your nausea medication, call the clinic.   BELOW ARE SYMPTOMS THAT SHOULD BE REPORTED IMMEDIATELY:  *FEVER GREATER THAN 100.5 F  *CHILLS WITH OR WITHOUT FEVER  NAUSEA AND VOMITING THAT IS NOT CONTROLLED WITH YOUR NAUSEA MEDICATION  *UNUSUAL SHORTNESS OF BREATH  *UNUSUAL BRUISING OR BLEEDING  TENDERNESS IN MOUTH AND THROAT WITH OR WITHOUT PRESENCE OF ULCERS  *URINARY PROBLEMS  *BOWEL PROBLEMS  UNUSUAL RASH Items with * indicate a potential emergency and should be followed up as soon as possible.  Feel free to call the clinic you have any questions or concerns. The clinic phone number is [phone].  Please show the CHEMO ALERT CARD at check-in to the Emergency Department and triage nurse.    

## 2014-10-04 NOTE — Telephone Encounter (Signed)
per pof to sch pt appt-gave pt copy of sch °

## 2014-10-04 NOTE — Progress Notes (Signed)
OFFICE PROGRESS NOTE   October 04, 2014   Physicians:P.Gehrig, J.Kinard, M.J.Baxley, K.Fogleman, H.Gruber, Jyothi Loda, Nunzio Cobbs)  INTERVAL HISTORY:  Patient is seen, alone for visit, , continuing adjuvant dose dense carboplatin taxol for IA serous endometrial carcinoma, due day 1 cycle 5 today. Day 8 cycle 4 was held due to leukopenia; granix will be increased to days 2,3,9 and 16.  Vaginal brachytherapy completed 09-18-14. Anticipated completion of chemotherapy will be 11-16-14. She will need scans prior to seeing Dr Alycia Rossetti ~ 4-6 weeks after completion of treatment.   Patient is feeling reasonably well other than some hemorrhoidal irritation. She has used proctozone cream and sitz baths, with improvement. She has slight peripheral neuropathy symptoms in left hand > right intermittently, none in feet. Nausea is controlled, she will try cold foods after treatment today due to sensitivity to food odors, and bowels are moving daily.  Peripheral IV access is not difficult.            No PAC Flu vaccine done  ONCOLOGIC HISTORY Patient has had chronic low back pain which continued 01-2014, with new vaginal spotting then. She saw Dr Pamala Hurry, with exam finding very atrophic vagina and hemorrhoids. With continued spotting whe had [place] which showed 4x3x2 cm uterus with 4.8 mm endometrial stripe and 2 small lesions felt to be polyps. D and C 05-17-14 ([id number] found high grade endometrial carcinoma with serous features. CT AP and CXR 05-31-14 showed no evidence of metastatic disease. She was seen by Dr Alycia Rossetti and taken to robotic assisted hysterectomy BSO pelvic and paraaortic nodes 06-05-14. Pathology [id number]) had superficially invasive serous carcinoma, stage IA. Post operative course was uncomplicated. Patient discussed pathology findings and recommendations with Dr Alycia Rossetti by phone, and is to see her for post operative follow up visit on 07-19-14. Dr Alycia Rossetti recommended adjuvant taxol  carboplatin + vaginal brachytherapy. First carbo taxol given 07-12-14, dose dense regimen chosen as patient very anxious about possible chemotherapy side effects. Granix added day after each treatment after day 15 cycle 1 held for neutropenia. Day 8 cycle 4 also held for low white count, granix increased.          Review of systems as above, also: No fever or symptoms of infection. Less back discomfort since she has stopped using treadmill. Bladder ok. No bleeding. No SOB with usual activity. Remainder of 10 point Review of Systems negative.  Objective:  Vital signs in last 24 hours:  BP 123/75 mmHg  Pulse 107  Temp(Src) 98.1 F (36.7 C) (Oral)  Resp 18  Ht 5\' 3"  (1.6 m)  Wt 114 lb (51.71 kg)  BMI 20.20 kg/m2  SpO2 100%  weight down 1 lb Alert, oriented and appropriate. Ambulatory without difficulty.  Alopecia  HEENT:PERRL, sclerae not icteric. Oral mucosa moist without lesions, posterior pharynx clear.  Neck supple. No JVD.  Lymphatics:no cervical,supraclavicular or inguinal adenopathy Resp: clear to auscultation bilaterally and normal percussion bilaterally Cardio: regular rate and rhythm. No gallop. GI: soft, nontender, not distended, no mass or organomegaly. Normally active bowel sounds. Surgical incisions not remarkable. Small, noninflamed hemorrhoidal tag not tender. No perirectal tenderness or fluctuance. Slight erythema perirectal area out to inner gluteal area.  Musculoskeletal/ Extremities: without pitting edema, cords, tenderness Neuro: no significant peripheral neuropathy. Otherwise nonfocal Skin otherwise without rash, ecchymosis, petechiae   Lab Results:  Results for orders placed or performed in visit on 10/04/14  CBC with Differential  Result Value Ref Range   WBC 2.4 (L) 3.9 - 10.3 10e3/uL  NEUT# 1.9 1.5 - 6.5 10e3/uL   HGB 11.1 (L) 11.6 - 15.9 g/dL   HCT 32.9 (L) 34.8 - 46.6 %   Platelets 285 145 - 400 10e3/uL   MCV 94.8 79.5 - 101.0 fL   MCH 32.0  25.1 - 34.0 pg   MCHC 33.7 31.5 - 36.0 g/dL   RBC 3.47 (L) 3.70 - 5.45 10e6/uL   RDW 17.4 (H) 11.2 - 14.5 %   lymph# 0.3 (L) 0.9 - 3.3 10e3/uL   MONO# 0.1 0.1 - 0.9 10e3/uL   Eosinophils Absolute 0.0 0.0 - 0.5 10e3/uL   Basophils Absolute 0.0 0.0 - 0.1 10e3/uL   NEUT% 82.2 (H) 38.4 - 76.8 %   LYMPH% 11.9 (L) 14.0 - 49.7 %   MONO% 5.9 0.0 - 14.0 %   EOS% 0.0 0.0 - 7.0 %   BASO% 0.0 0.0 - 2.0 %    CMET today with glucose 169 on steroids, creat 0.8, bili 0.25 and otherwise not remarkable  Studies/Results:  No results found.  Medications: I have reviewed the patient's current medications. WIll add additional dose of granix after day 1 treatments, so that granix will now be used days 2,3,9,16. She is to use the proctozone just on hemorrhoids, to avoid yeast from this steroid out onto surrounding skin  DISCUSSION: Discussed bowel regimen, proctozone as above and encouraged sitz baths. She agrees with plan for granix as above, which hopefully will allow remaining treatments to be given as planned.   Assessment/Plan:  1.IA high grade serous endometrial carcinoma: post robotic assisted hysterectomy with BSO and bilateral pelvic and para aortic node evaluation 06-05-2014. Continuing adjuvant chemotherapy, day 1 cycle 5 today, gCSF support increased as noted. I will see her with day 15 cycle 5.  2.hemorrhoids irritated: as above 3 Latex allergy, no rashes presently 4. bladder sensation improved 5.left renal cyst on CT, without concerning features 6.post benign biopsy left breast 1990s, up to date on mammograms  7.benign skin lesions, followed yearly by Dr Hedy Jacob  8. Up to date colonoscopy 9.atherosclerosis by CT 10.lumbar spondylosis with some chronic back discomfort 11.hypertension, elevated lipids followed by PCP 12. flu vaccine done 13.chemo neutropenia: granix as noted 14.mild chemo anemia: not symptomatic, not on iron due to constipation concerns, follow  All questions answered;  patient is in agreement with recommendations and plans. Chemo and granix orders confirmed. Time spent 25 min including >50% counseling and coordination of care.   LIVESAY,LENNIS P, MD   10/04/2014, 8:23 AM

## 2014-10-05 ENCOUNTER — Ambulatory Visit (HOSPITAL_BASED_OUTPATIENT_CLINIC_OR_DEPARTMENT_OTHER): Payer: Medicare HMO

## 2014-10-05 DIAGNOSIS — C55 Malignant neoplasm of uterus, part unspecified: Secondary | ICD-10-CM

## 2014-10-05 DIAGNOSIS — D701 Agranulocytosis secondary to cancer chemotherapy: Secondary | ICD-10-CM | POA: Diagnosis not present

## 2014-10-05 MED ORDER — TBO-FILGRASTIM 300 MCG/0.5ML ~~LOC~~ SOSY
300.0000 ug | PREFILLED_SYRINGE | Freq: Once | SUBCUTANEOUS | Status: AC
Start: 2014-10-05 — End: 2014-10-05
  Administered 2014-10-05: 300 ug via SUBCUTANEOUS
  Filled 2014-10-05: qty 0.5

## 2014-10-06 ENCOUNTER — Ambulatory Visit (HOSPITAL_BASED_OUTPATIENT_CLINIC_OR_DEPARTMENT_OTHER): Payer: Medicare HMO

## 2014-10-06 DIAGNOSIS — D701 Agranulocytosis secondary to cancer chemotherapy: Secondary | ICD-10-CM

## 2014-10-06 DIAGNOSIS — C55 Malignant neoplasm of uterus, part unspecified: Secondary | ICD-10-CM

## 2014-10-06 MED ORDER — TBO-FILGRASTIM 300 MCG/0.5ML ~~LOC~~ SOSY
300.0000 ug | PREFILLED_SYRINGE | Freq: Once | SUBCUTANEOUS | Status: AC
  Administered 2014-10-06: 300 ug via SUBCUTANEOUS

## 2014-10-06 NOTE — Patient Instructions (Signed)
Pegfilgrastim injection What is this medicine? PEGFILGRASTIM (peg fil GRA stim) is a long-acting granulocyte colony-stimulating factor that stimulates the growth of neutrophils, a type of white blood cell important in the body's fight against infection. It is used to reduce the incidence of fever and infection in patients with certain types of cancer who are receiving chemotherapy that affects the bone marrow. This medicine may be used for other purposes; ask your health care provider or pharmacist if you have questions. COMMON BRAND NAME(S): Neulasta What should I tell my health care provider before I take this medicine? They need to know if you have any of these conditions: -latex allergy -ongoing radiation therapy -sickle cell disease -skin reactions to acrylic adhesives (On-Body Injector only) -an unusual or allergic reaction to pegfilgrastim, filgrastim, other medicines, foods, dyes, or preservatives -pregnant or trying to get pregnant -breast-feeding How should I use this medicine? This medicine is for injection under the skin. If you get this medicine at home, you will be taught how to prepare and give the pre-filled syringe or how to use the On-body Injector. Refer to the patient Instructions for Use for detailed instructions. Use exactly as directed. Take your medicine at regular intervals. Do not take your medicine more often than directed. It is important that you put your used needles and syringes in a special sharps container. Do not put them in a trash can. If you do not have a sharps container, call your pharmacist or healthcare provider to get one. Talk to your pediatrician regarding the use of this medicine in children. Special care may be needed. Overdosage: If you think you have taken too much of this medicine contact a poison control center or emergency room at once. NOTE: This medicine is only for you. Do not share this medicine with others. What if I miss a dose? It is  important not to miss your dose. Call your doctor or health care professional if you miss your dose. If you miss a dose due to an On-body Injector failure or leakage, a new dose should be administered as soon as possible using a single prefilled syringe for manual use. What may interact with this medicine? Interactions have not been studied. Give your health care provider a list of all the medicines, herbs, non-prescription drugs, or dietary supplements you use. Also tell them if you smoke, drink alcohol, or use illegal drugs. Some items may interact with your medicine. This list may not describe all possible interactions. Give your health care provider a list of all the medicines, herbs, non-prescription drugs, or dietary supplements you use. Also tell them if you smoke, drink alcohol, or use illegal drugs. Some items may interact with your medicine. What should I watch for while using this medicine? You may need blood work done while you are taking this medicine. If you are going to need a MRI, CT scan, or other procedure, tell your doctor that you are using this medicine (On-Body Injector only). What side effects may I notice from receiving this medicine? Side effects that you should report to your doctor or health care professional as soon as possible: -allergic reactions like skin rash, itching or hives, swelling of the face, lips, or tongue -dizziness -fever -pain, redness, or irritation at site where injected -pinpoint red spots on the skin -shortness of breath or breathing problems -stomach or side pain, or pain at the shoulder -swelling -tiredness -trouble passing urine Side effects that usually do not require medical attention (report to your doctor   or health care professional if they continue or are bothersome): -bone pain -muscle pain This list may not describe all possible side effects. Call your doctor for medical advice about side effects. You may report side effects to FDA at  1-800-FDA-1088. Where should I keep my medicine? Keep out of the reach of children. Store pre-filled syringes in a refrigerator between 2 and 8 degrees C (36 and 46 degrees F). Do not freeze. Keep in carton to protect from light. Throw away this medicine if it is left out of the refrigerator for more than 48 hours. Throw away any unused medicine after the expiration date. NOTE: This sheet is a summary. It may not cover all possible information. If you have questions about this medicine, talk to your doctor, pharmacist, or health care provider.  2015, [employer]vier/Gold Standard. (2013-09-14 16:14:05)  

## 2014-10-08 ENCOUNTER — Encounter: Payer: Self-pay | Admitting: Radiation Oncology

## 2014-10-08 NOTE — Progress Notes (Signed)
  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 Treatment length was 3.5 cm. Prescription was to the mucosal surface.  Narrative: The patient tolerated radiation treatment relatively well.   She had minimal discomfort related to the cylinder insertions and minimal dysuria.  Plan: The patient has completed radiation treatment. The patient will return to radiation oncology clinic for routine followup in one month. I advised them to call or return sooner if they have any questions or concerns related to their recovery or treatment.  -----------------------------------  Blair Promise, PhD, MD

## 2014-10-11 ENCOUNTER — Ambulatory Visit (HOSPITAL_BASED_OUTPATIENT_CLINIC_OR_DEPARTMENT_OTHER): Payer: Medicare HMO

## 2014-10-11 ENCOUNTER — Other Ambulatory Visit (HOSPITAL_COMMUNITY)
Admission: RE | Admit: 2014-10-11 | Discharge: 2014-10-11 | Disposition: A | Discharge status: Home / Self Care | Payer: Medicare HMO | Source: Ambulatory Visit | Attending: Oncology | Admitting: Oncology

## 2014-10-11 ENCOUNTER — Other Ambulatory Visit (HOSPITAL_BASED_OUTPATIENT_CLINIC_OR_DEPARTMENT_OTHER): Payer: Medicare HMO

## 2014-10-11 VITALS — BP 112/67 | HR 89 | Temp 98.4°F | Resp 18

## 2014-10-11 DIAGNOSIS — C542 Malignant neoplasm of myometrium: Secondary | ICD-10-CM | POA: Diagnosis present

## 2014-10-11 DIAGNOSIS — D701 Agranulocytosis secondary to cancer chemotherapy: Secondary | ICD-10-CM | POA: Diagnosis not present

## 2014-10-11 DIAGNOSIS — C541 Malignant neoplasm of endometrium: Secondary | ICD-10-CM | POA: Diagnosis not present

## 2014-10-11 LAB — COMPREHENSIVE METABOLIC PANEL
ALT: 18 U/L (ref 0–35)
AST: 29 U/L (ref 0–37)
Albumin: 4.4 g/dL (ref 3.5–5.2)
Alkaline Phosphatase: 65 U/L (ref 39–117)
Anion gap: 11 (ref 5–15)
BUN: 18 mg/dL (ref 6–23)
CO2: 21 mmol/L (ref 19–32)
Calcium: 9.6 mg/dL (ref 8.4–10.5)
Chloride: 101 mmol/L (ref 96–112)
Creatinine, Ser: 0.77 mg/dL (ref 0.50–1.10)
GFR calc Af Amer: >90 mL/min (ref >90)
GFR calc non Af Amer: 86 mL/min — ABNORMAL LOW (ref >90)
Glucose, Bld: 144 mg/dL — ABNORMAL HIGH (ref 70–99)
Potassium: 4.4 mmol/L (ref 3.5–5.1)
Sodium: 133 mmol/L — ABNORMAL LOW (ref 135–145)
Total Bilirubin: 0.2 mg/dL — ABNORMAL LOW (ref 0.3–1.2)
Total Protein: 7.3 g/dL (ref 6.0–8.3)

## 2014-10-11 LAB — CBC WITH DIFFERENTIAL/PLATELET
BASO%: 0.3 % (ref 0.0–2.0)
Basophils Absolute: 0 10*3/uL (ref 0.0–0.1)
EOS%: 0 % (ref 0.0–7.0)
Eosinophils Absolute: 0 10*3/uL (ref 0.0–0.5)
HCT: 32.4 % — ABNORMAL LOW (ref 34.8–46.6)
HGB: 10.6 g/dL — ABNORMAL LOW (ref 11.6–15.9)
LYMPH%: 9.5 % — ABNORMAL LOW (ref 14.0–49.7)
MCH: 31.8 pg (ref 25.1–34.0)
MCHC: 32.8 g/dL (ref 31.5–36.0)
MCV: 97 fL (ref 79.5–101.0)
MONO#: 0.3 10*3/uL (ref 0.1–0.9)
MONO%: 7.1 % (ref 0.0–14.0)
NEUT#: 3.1 10*3/uL (ref 1.5–6.5)
NEUT%: 83.1 % — ABNORMAL HIGH (ref 38.4–76.8)
Platelets: 291 10*3/uL (ref 145–400)
RBC: 3.34 10*6/uL — ABNORMAL LOW (ref 3.70–5.45)
RDW: 19.4 % — ABNORMAL HIGH (ref 11.2–14.5)
WBC: 3.7 10*3/uL — ABNORMAL LOW (ref 3.9–10.3)
lymph#: 0.4 10*3/uL — ABNORMAL LOW (ref 0.9–3.3)

## 2014-10-11 MED ORDER — SODIUM CHLORIDE 0.9 % IV SOLN
Freq: Once | INTRAVENOUS | Status: AC
  Administered 2014-10-11: 09:00:00 via INTRAVENOUS

## 2014-10-11 MED ORDER — SODIUM CHLORIDE 0.9 % IV SOLN
Freq: Once | INTRAVENOUS | Status: AC
  Administered 2014-10-11: 10:00:00 via INTRAVENOUS
  Filled 2014-10-11: qty 50

## 2014-10-11 MED ORDER — SODIUM CHLORIDE 0.9 % IJ SOLN
10.0000 mL | INTRAMUSCULAR | Status: DC | PRN
  Filled 2014-10-11: qty 10

## 2014-10-11 MED ORDER — FAMOTIDINE IN NACL 20-0.9 MG/50ML-% IV SOLN
20.0000 mg | Freq: Once | INTRAVENOUS | Status: AC
  Administered 2014-10-11: 20 mg via INTRAVENOUS

## 2014-10-11 MED ORDER — HEPARIN SOD (PORK) LOCK FLUSH 100 UNIT/ML IV SOLN
500.0000 [IU] | Freq: Once | INTRAVENOUS | Status: DC
  Filled 2014-10-11: qty 5

## 2014-10-11 MED ORDER — FAMOTIDINE IN NACL 20-0.9 MG/50ML-% IV SOLN
INTRAVENOUS | Status: AC
  Filled 2014-10-11: qty 50

## 2014-10-11 MED ORDER — SODIUM CHLORIDE 0.9 % IV SOLN
Freq: Once | INTRAVENOUS | Status: AC
  Administered 2014-10-11: 09:00:00 via INTRAVENOUS
  Filled 2014-10-11: qty 4

## 2014-10-11 MED ORDER — DIPHENHYDRAMINE HCL 50 MG/ML IJ SOLN: 50.0000 mg | Freq: Once | INTRAMUSCULAR | Status: DC

## 2014-10-11 MED ORDER — PACLITAXEL CHEMO INJECTION 300 MG/50ML
80.0000 mg/m2 | Freq: Once | INTRAVENOUS | Status: AC
  Administered 2014-10-11: 126 mg via INTRAVENOUS
  Filled 2014-10-11: qty 21

## 2014-10-11 NOTE — Patient Instructions (Signed)
[facility] Discharge Instructions for Patients Receiving Chemotherapy  Today you received the following chemotherapy agents Paclitaxel.   To help prevent nausea and vomiting after your treatment, we encourage you to take your nausea medication as directed.    If you develop nausea and vomiting that is not controlled by your nausea medication, call the clinic.   BELOW ARE SYMPTOMS THAT SHOULD BE REPORTED IMMEDIATELY:  *FEVER GREATER THAN 100.5 F  *CHILLS WITH OR WITHOUT FEVER  NAUSEA AND VOMITING THAT IS NOT CONTROLLED WITH YOUR NAUSEA MEDICATION  *UNUSUAL SHORTNESS OF BREATH  *UNUSUAL BRUISING OR BLEEDING  TENDERNESS IN MOUTH AND THROAT WITH OR WITHOUT PRESENCE OF ULCERS  *URINARY PROBLEMS  *BOWEL PROBLEMS  UNUSUAL RASH Items with * indicate a potential emergency and should be followed up as soon as possible.  Feel free to call the clinic you have any questions or concerns. The clinic phone number is [phone].  Please show the CHEMO ALERT CARD at check-in to the Emergency Department and triage nurse.   

## 2014-10-12 ENCOUNTER — Ambulatory Visit (HOSPITAL_BASED_OUTPATIENT_CLINIC_OR_DEPARTMENT_OTHER): Payer: Medicare HMO

## 2014-10-12 VITALS — BP 114/63 | HR 86 | Temp 98.3°F

## 2014-10-12 DIAGNOSIS — D701 Agranulocytosis secondary to cancer chemotherapy: Secondary | ICD-10-CM | POA: Diagnosis not present

## 2014-10-12 DIAGNOSIS — C55 Malignant neoplasm of uterus, part unspecified: Secondary | ICD-10-CM

## 2014-10-12 MED ORDER — TBO-FILGRASTIM 300 MCG/0.5ML ~~LOC~~ SOSY
300.0000 ug | PREFILLED_SYRINGE | Freq: Once | SUBCUTANEOUS | Status: AC
  Administered 2014-10-12: 300 ug via SUBCUTANEOUS
  Filled 2014-10-12: qty 0.5

## 2014-10-14 ENCOUNTER — Other Ambulatory Visit: Payer: Self-pay | Admitting: Oncology

## 2014-10-18 ENCOUNTER — Other Ambulatory Visit: Payer: Self-pay

## 2014-10-18 ENCOUNTER — Encounter: Payer: Self-pay | Admitting: Oncology

## 2014-10-18 ENCOUNTER — Ambulatory Visit (HOSPITAL_BASED_OUTPATIENT_CLINIC_OR_DEPARTMENT_OTHER): Payer: Medicare HMO

## 2014-10-18 ENCOUNTER — Ambulatory Visit (HOSPITAL_BASED_OUTPATIENT_CLINIC_OR_DEPARTMENT_OTHER): Payer: Medicare HMO | Admitting: Oncology

## 2014-10-18 ENCOUNTER — Other Ambulatory Visit (HOSPITAL_BASED_OUTPATIENT_CLINIC_OR_DEPARTMENT_OTHER): Payer: Medicare HMO

## 2014-10-18 VITALS — BP 136/78 | HR 100 | Temp 97.8°F | Resp 20 | Ht 63.0 in | Wt 113.8 lb

## 2014-10-18 DIAGNOSIS — G620 Drug-induced polyneuropathy: Secondary | ICD-10-CM

## 2014-10-18 DIAGNOSIS — Z5111 Encounter for antineoplastic chemotherapy: Secondary | ICD-10-CM | POA: Diagnosis not present

## 2014-10-18 DIAGNOSIS — C541 Malignant neoplasm of endometrium: Secondary | ICD-10-CM | POA: Diagnosis not present

## 2014-10-18 DIAGNOSIS — D6481 Anemia due to antineoplastic chemotherapy: Secondary | ICD-10-CM | POA: Diagnosis not present

## 2014-10-18 DIAGNOSIS — K644 Residual hemorrhoidal skin tags: Secondary | ICD-10-CM

## 2014-10-18 DIAGNOSIS — K649 Unspecified hemorrhoids: Secondary | ICD-10-CM | POA: Diagnosis not present

## 2014-10-18 DIAGNOSIS — K1370 Unspecified lesions of oral mucosa: Secondary | ICD-10-CM

## 2014-10-18 DIAGNOSIS — D701 Agranulocytosis secondary to cancer chemotherapy: Secondary | ICD-10-CM

## 2014-10-18 DIAGNOSIS — D500 Iron deficiency anemia secondary to blood loss (chronic): Secondary | ICD-10-CM

## 2014-10-18 DIAGNOSIS — R200 Anesthesia of skin: Secondary | ICD-10-CM

## 2014-10-18 DIAGNOSIS — T451X5A Adverse effect of antineoplastic and immunosuppressive drugs, initial encounter: Secondary | ICD-10-CM

## 2014-10-18 LAB — COMPREHENSIVE METABOLIC PANEL (CC13)
ALT: 18 U/L (ref 0–55)
AST: 17 U/L (ref 5–34)
Albumin: 3.9 g/dL (ref 3.5–5.0)
Alkaline Phosphatase: 67 U/L (ref 40–150)
Anion Gap: 16 mEq/L — ABNORMAL HIGH (ref 3–11)
BUN: 13 mg/dL (ref 7.0–26.0)
CO2: 16 mEq/L — ABNORMAL LOW (ref 22–29)
Calcium: 9.5 mg/dL (ref 8.4–10.4)
Chloride: 106 mEq/L (ref 98–109)
Creatinine: 0.7 mg/dL (ref 0.6–1.1)
EGFR: 86 mL/min/{1.73_m2} — ABNORMAL LOW (ref >90)
Glucose: 171 mg/dl — ABNORMAL HIGH (ref 70–140)
Potassium: 4.4 mEq/L (ref 3.5–5.1)
Sodium: 138 mEq/L (ref 136–145)
Total Bilirubin: <0.2 mg/dL (ref 0.20–1.20)
Total Protein: 6.8 g/dL (ref 6.4–8.3)

## 2014-10-18 LAB — CBC WITH DIFFERENTIAL/PLATELET
BASO%: 0.2 % (ref 0.0–2.0)
Basophils Absolute: 0 10*3/uL (ref 0.0–0.1)
EOS%: 0 % (ref 0.0–7.0)
Eosinophils Absolute: 0 10*3/uL (ref 0.0–0.5)
HCT: 29 % — ABNORMAL LOW (ref 34.8–46.6)
HGB: 9.9 g/dL — ABNORMAL LOW (ref 11.6–15.9)
LYMPH%: 5 % — ABNORMAL LOW (ref 14.0–49.7)
MCH: 33.1 pg (ref 25.1–34.0)
MCHC: 34.1 g/dL (ref 31.5–36.0)
MCV: 97 fL (ref 79.5–101.0)
MONO#: 0.2 10*3/uL (ref 0.1–0.9)
MONO%: 4.1 % (ref 0.0–14.0)
NEUT#: 3.8 10*3/uL (ref 1.5–6.5)
NEUT%: 90.7 % — ABNORMAL HIGH (ref 38.4–76.8)
Platelets: 220 10*3/uL (ref 145–400)
RBC: 2.99 10*6/uL — ABNORMAL LOW (ref 3.70–5.45)
RDW: 17.7 % — ABNORMAL HIGH (ref 11.2–14.5)
WBC: 4.2 10*3/uL (ref 3.9–10.3)
lymph#: 0.2 10*3/uL — ABNORMAL LOW (ref 0.9–3.3)

## 2014-10-18 MED ORDER — SODIUM CHLORIDE 0.9 % IV SOLN
Freq: Once | INTRAVENOUS | Status: AC
  Administered 2014-10-18: 10:00:00 via INTRAVENOUS
  Filled 2014-10-18: qty 50

## 2014-10-18 MED ORDER — SODIUM CHLORIDE 0.9 % IV SOLN
Freq: Once | INTRAVENOUS | Status: AC
  Administered 2014-10-18: 09:00:00 via INTRAVENOUS

## 2014-10-18 MED ORDER — SODIUM CHLORIDE 0.9 % IV SOLN
Freq: Once | INTRAVENOUS | Status: AC
  Administered 2014-10-18: 10:00:00 via INTRAVENOUS
  Filled 2014-10-18: qty 4

## 2014-10-18 MED ORDER — FAMOTIDINE IN NACL 20-0.9 MG/50ML-% IV SOLN
20.0000 mg | Freq: Once | INTRAVENOUS | Status: AC
  Administered 2014-10-18: 20 mg via INTRAVENOUS

## 2014-10-18 MED ORDER — DEXAMETHASONE 4 MG PO TABS: ORAL_TABLET | ORAL | Status: DC

## 2014-10-18 MED ORDER — DIPHENHYDRAMINE HCL 50 MG/ML IJ SOLN: 50.0000 mg | Freq: Once | INTRAMUSCULAR | Status: DC

## 2014-10-18 MED ORDER — PACLITAXEL CHEMO INJECTION 300 MG/50ML
80.0000 mg/m2 | Freq: Once | INTRAVENOUS | Status: AC
  Administered 2014-10-18: 126 mg via INTRAVENOUS
  Filled 2014-10-18: qty 21

## 2014-10-18 MED ORDER — FERROUS FUMARATE-FOLIC ACID 324-1 MG PO TABS: ORAL_TABLET | ORAL | Status: DC

## 2014-10-18 MED ORDER — LORAZEPAM 0.5 MG PO TABS: ORAL_TABLET | ORAL | Status: DC

## 2014-10-18 NOTE — Patient Instructions (Signed)
[facility] Discharge Instructions for Patients Receiving Chemotherapy  Today you received the following chemotherapy agents taxol  To help prevent nausea and vomiting after your treatment, we encourage you to take your nausea medication as directed   If you develop nausea and vomiting that is not controlled by your nausea medication, call the clinic.   BELOW ARE SYMPTOMS THAT SHOULD BE REPORTED IMMEDIATELY:  *FEVER GREATER THAN 100.5 F  *CHILLS WITH OR WITHOUT FEVER  NAUSEA AND VOMITING THAT IS NOT CONTROLLED WITH YOUR NAUSEA MEDICATION  *UNUSUAL SHORTNESS OF BREATH  *UNUSUAL BRUISING OR BLEEDING  TENDERNESS IN MOUTH AND THROAT WITH OR WITHOUT PRESENCE OF ULCERS  *URINARY PROBLEMS  *BOWEL PROBLEMS  UNUSUAL RASH Items with * indicate a potential emergency and should be followed up as soon as possible.  Feel free to call the clinic you have any questions or concerns. The clinic phone number is [phone].  

## 2014-10-18 NOTE — Patient Instructions (Signed)
Use Miralax once or twice daily to keep bowels moving daily. OK to use stool softener with this  We will ask your pharmacy to give you either name brand Hemocyte if your insurance cover, otherwise over the counter ferrous fumarate. These are iron preparations that are generally easiest on GI tract. Take one daily on empty stomach with OJ for best absorption.  Stop ramipril  Use saline nose spray every 30 min while awake.  Sitz baths are a good idea

## 2014-10-18 NOTE — Progress Notes (Signed)
OFFICE PROGRESS NOTE   October 18, 2014   Physicians:P.Gehrig, J.Kinard, M.J.Baxley, K.Fogleman, H.Gruber, Jyothi Parsons, (Maureen Jarrell)  INTERVAL HISTORY:  Patient is seen, alone for visit, in continuing attention to adjuvant chemotherapy in process for IA serous endometrial carcinoma, due Day 15 Cycle 5 dose dense carbo taxol today. She has required granix support days 2,3,9,16,  but has maintained counts with this. Chemo is expected to complete on 11-08-14. She will have scans prior to seeing Dr Alycia Rossetti ~ early July. She is to see Dr Sondra Come 10-22-14.  Patient continues to tolerate chemotherapy well overall, very conscientious about everything related to treatment. She had some constipation after last treatment, but had been using miralax only intermittently and will change that to daily for now. Hemorrhoids have been uncomfortable, better with sitz baths and hydrocortisone cream including internal applicator; she has had some minimal bleeding from the hemorrhoids at times. Nausea is controlled and she is eating and drinking fluids adequately. She has had some bloody mucous from left nares, will increase saline spray to q 30 min thru day. No other bleeding. Blood pressure has been 104/77, 114/82, 120s/80s; she has felt lightheaded at times, did hold ramipril earlier this week and will stop this for now. Minimal neuropathy left fingers and feet.  No PAC No genetics testing  ONCOLOGIC HISTORY Patient has had chronic low back pain which continued 01-2014, with new vaginal spotting then. She saw Dr Pamala Hurry, with exam finding very atrophic vagina and hemorrhoids. With continued spotting whe had [place] which showed 4x3x2 cm uterus with 4.8 mm endometrial stripe and 2 small lesions felt to be polyps. D and C 05-17-14 ([id number] found high grade endometrial carcinoma with serous features. CT AP and CXR 05-31-14 showed no evidence of metastatic disease. She was seen by Dr Alycia Rossetti and taken to robotic assisted  hysterectomy BSO pelvic and paraaortic nodes 06-05-14. Pathology [phone]) had superficially invasive serous carcinoma, stage IA. Post operative course was uncomplicated. Patient discussed pathology findings and recommendations with Dr Alycia Rossetti by phone, and is to see her for post operative follow up visit on 07-19-14. Dr Alycia Rossetti recommended adjuvant taxol carboplatin + vaginal brachytherapy. First carbo taxol given 07-12-14, dose dense regimen chosen as patient very anxious about possible chemotherapy side effects. Granix added day after each treatment after day 15 cycle 1 held for neutropenia. Day 8 cycle 4 also held for low white count, granix increased.    Review of systems as above, also: Some intermittent numbness tip of tongue and lips. No new or different pain. Bladder ok.  Remainder of 10 point Review of Systems negative.  Objective:  Vital signs in last 24 hours:  Weight 131/ 12 oz, 128/80, 84 regular, 98.5  Alert, oriented and appropriate. Ambulatory without difficulty.  Alopecia  HEENT:PERRL, sclerae not icteric. Oral mucosa moist without lesions, posterior pharynx clear.  Neck supple. No JVD.  Lymphatics:no cervical,supraclavicular or inguinal adenopathy Resp: clear to auscultation bilaterally and normal percussion bilaterally Cardio: regular rate and rhythm. No gallop. GI: soft, nontender, not distended, no mass or organomegaly. Normally active bowel sounds. Small, noninflamed, soft external hemorrhoidal tag, no perirectal tenderness or fluctuance. Surgical incisions not remarkable. Musculoskeletal/ Extremities: without pitting edema, cords, tenderness Neuro: no significant peripheral neuropathy. Otherwise nonfocal  PSYCH appropriate mood and affect Skin without rash, ecchymosis, petechiae. Venous access sites not remarkable  Lab Results:  Results for orders placed or performed in visit on 10/18/14  CBC with Differential  Result Value Ref Range   WBC 4.2 3.9 -  10.3  10e3/uL   NEUT# 3.8 1.5 - 6.5 10e3/uL   HGB 9.9 (L) 11.6 - 15.9 g/dL   HCT 29.0 (L) 34.8 - 46.6 %   Platelets 220 145 - 400 10e3/uL   MCV 97.0 79.5 - 101.0 fL   MCH 33.1 25.1 - 34.0 pg   MCHC 34.1 31.5 - 36.0 g/dL   RBC 2.99 (L) 3.70 - 5.45 10e6/uL   RDW 17.7 (H) 11.2 - 14.5 %   lymph# 0.2 (L) 0.9 - 3.3 10e3/uL   MONO# 0.2 0.1 - 0.9 10e3/uL   Eosinophils Absolute 0.0 0.0 - 0.5 10e3/uL   Basophils Absolute 0.0 0.0 - 0.1 10e3/uL   NEUT% 90.7 (H) 38.4 - 76.8 %   LYMPH% 5.0 (L) 14.0 - 49.7 %   MONO% 4.1 0.0 - 14.0 %   EOS% 0.0 0.0 - 7.0 %   BASO% 0.2 0.0 - 2.0 %    CMET with glucose 171, creat 0.7, bili <0.2 otherwise not remarkable  Will send iron studies with next blood drawn  Studies/Results:  No results found.  Medications: I have reviewed the patient's current medications. DC Ramipril. Use miralax daily. Increase saline nasal spray to q 30 min while awake. Will begin ferrous fumarate once daily on empty stomach with OJ  DISCUSSION: meds as above. Peripheral neuropathy not requiring adjustment in taxol now.  Assessment/Plan:  1.IA high grade serous endometrial carcinoma: post robotic assisted hysterectomy with BSO and bilateral pelvic and para aortic node evaluation 06-05-2014. Continuing adjuvant chemotherapy, day 15 cycle 5 today, gCSF. Scans after completes cycle 6, shortly prior to return visit to Dr Alycia Rossetti 2.hemorrhoids irritated: as above 3 Latex allergy: noted in EMR 4. Hx HTN with BPs lower and symptomatic per history. Hold ramipril 5.left renal cyst on CT, without concerning features 6.post benign biopsy left breast 1990s, up to date on mammograms  7.benign skin lesions, followed yearly by Dr Hedy Jacob  8. Up to date colonoscopy 9.atherosclerosis by CT 10.lumbar spondylosis with some chronic back discomfort 11.hypertension, elevated lipids followed by PCP 12. flu vaccine done 13.chemo neutropenia: granix as noted 14. chemo anemia: seems more symptomatic. Add  ferrous fumarate once daily with OJ and increase miralax to daily. [email or web address].[email or web address] numbness tip of tongue and around mouth: possible with anxiety/ hyperventillation. Doubt taxol related. No base of skull pain. Questions answered. Chemo and granix orders confirmed. TIme spent 25 min including >50% counseling and coordination of care. She will call if concerns prior to next visit.   Annette Tucker,LENNIS P, MD   10/18/2014, 8:25 AM

## 2014-10-19 ENCOUNTER — Ambulatory Visit (HOSPITAL_BASED_OUTPATIENT_CLINIC_OR_DEPARTMENT_OTHER): Payer: Medicare HMO

## 2014-10-19 VITALS — BP 128/80 | HR 84 | Temp 98.5°F

## 2014-10-19 DIAGNOSIS — D701 Agranulocytosis secondary to cancer chemotherapy: Secondary | ICD-10-CM | POA: Diagnosis not present

## 2014-10-19 DIAGNOSIS — C55 Malignant neoplasm of uterus, part unspecified: Secondary | ICD-10-CM

## 2014-10-19 MED ORDER — TBO-FILGRASTIM 300 MCG/0.5ML ~~LOC~~ SOSY
300.0000 ug | PREFILLED_SYRINGE | Freq: Once | SUBCUTANEOUS | Status: AC
  Administered 2014-10-19: 300 ug via SUBCUTANEOUS
  Filled 2014-10-19: qty 0.5

## 2014-10-20 DIAGNOSIS — G620 Drug-induced polyneuropathy: Secondary | ICD-10-CM | POA: Insufficient documentation

## 2014-10-20 DIAGNOSIS — D500 Iron deficiency anemia secondary to blood loss (chronic): Secondary | ICD-10-CM | POA: Insufficient documentation

## 2014-10-20 DIAGNOSIS — T451X5A Adverse effect of antineoplastic and immunosuppressive drugs, initial encounter: Secondary | ICD-10-CM | POA: Insufficient documentation

## 2014-10-20 DIAGNOSIS — K644 Residual hemorrhoidal skin tags: Secondary | ICD-10-CM | POA: Insufficient documentation

## 2014-10-20 DIAGNOSIS — C541 Malignant neoplasm of endometrium: Secondary | ICD-10-CM | POA: Insufficient documentation

## 2014-10-22 ENCOUNTER — Ambulatory Visit
Admission: RE | Admit: 2014-10-22 | Discharge: 2014-10-22 | Disposition: A | Discharge status: Home / Self Care | Payer: Medicare HMO | Source: Ambulatory Visit | Attending: Radiation Oncology | Admitting: Radiation Oncology

## 2014-10-22 ENCOUNTER — Encounter: Payer: Self-pay | Admitting: Oncology

## 2014-10-22 VITALS — BP 117/79 | HR 98 | Temp 98.7°F | Resp 16 | Ht 63.0 in | Wt 113.9 lb

## 2014-10-22 DIAGNOSIS — C541 Malignant neoplasm of endometrium: Secondary | ICD-10-CM

## 2014-10-22 HISTORY — DX: Reserved for inherently not codable concepts without codable children: IMO0001

## 2014-10-22 HISTORY — DX: Reserved for concepts with insufficient information to code with codable children: IMO0002

## 2014-10-22 NOTE — Progress Notes (Signed)
Radiation Oncology         [phone] ________________________________  Name: Annette Tucker MRN: [id number]  Date: 10/22/2014  DOB: March 01, 1946  Follow-Up Visit Note  CC: [name], MD  Gordy Levan, MD    ICD-9-CM ICD-10-CM   1. 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  She has had some problems with constipation recently but after meeting with Dr. Marko Plume has started using Colace and MiraLAX on a more regular basis and cleared this issue up. Patient denies any vaginal bleeding or discomfort in this area. She denies any hematuria or dysuria.  She continues on adjuvant chemotherapy.                              ALLERGIES:  is allergic to latex.  Meds: Current Outpatient Prescriptions  Medication Sig Dispense Refill  . acetaminophen (TYLENOL) 500 MG tablet Take 500 mg by mouth once as needed for moderate pain.     Marland Kitchen aspirin 81 MG EC tablet Take 81 mg by mouth every evening.     . Calcium Carbonate-Vitamin D (CALCIUM 600+D) 600-400 MG-UNIT per tablet Take 1 tablet by mouth 2 (two) times daily. @lunch  and in the evening    . cholecalciferol (VITAMIN D) 1000 UNITS tablet Take 1,000 Units by mouth daily.     . Coenzyme Q10 (CO Q-10) 100 MG CAPS Take 1 capsule by mouth every morning.     Marland Kitchen dexamethasone (DECADRON) 4 MG tablet TAKE 4 TABS = 16 MG WITH FOOD 12 HRS PRIOR TO TAXOL CHEMOTHERAPY 7 tablet 0  . docusate sodium (COLACE) 100 MG capsule Take 100 mg by mouth daily.    . Ferrous Fumarate-Folic Acid 017-4 MG TABS Take 1 tablet on an empty stomach with OJ. 30 each 4  . fish oil-omega-3 fatty acids 1000 MG capsule Take 1 g by mouth every morning.     . Flaxseed, Linseed, (FLAX SEED OIL PO) Take 1,000 Units by mouth every evening.     Marland Kitchen LORazepam (ATIVAN) 0.5 MG tablet Place 1 tablet under the tongue or swallow  every 6 hours as needed for nausea. Will make drowsy. Take 1 tablet night of first chemo whether or not any nausea. 15 tablet 0  . Magnesium 250 MG TABS Take 1 tablet by mouth every evening.     . Multiple Vitamins-Minerals (MULTIVITAMIN WITH MINERALS) tablet Take 1 tablet by mouth every morning.     . ondansetron (ZOFRAN) 8 MG tablet Take 1 tab every 8 hrs as needed for nausea. Take morning after chemo whether or not any nausea. 30 tablet 1  . polyethylene glycol (MIRALAX / GLYCOLAX) packet Take 17 g by mouth daily.    . simvastatin (ZOCOR) 20 MG tablet TAKE 1 TABLET (20 MG TOTAL) BY MOUTH AT BEDTIME. 90 tablet 1  . vitamin E 400 UNIT capsule Take 400 Units by mouth every morning.     Marland Kitchen ibuprofen (ADVIL,MOTRIN) 600 MG tablet Take 1 tablet (600 mg total) by mouth every 6 (six) hours as needed for mild pain. (Patient not taking: Reported on 07/26/2014) 60 tablet 1  . loratadine (CLARITIN) 10 MG tablet Take 10 mg by mouth daily as needed for allergies.     Marland Kitchen PROCTOZONE-HC 2.5 % rectal cream Place  1 application rectally as needed.     . ramipril (ALTACE) 5 MG capsule TAKE 1 CAPSULE (5 MG TOTAL) BY MOUTH DAILY. (Patient not taking: Reported on 10/18/2014) 90 capsule 0   No current facility-administered medications for this encounter.    Physical Findings: The patient is in no acute distress. Patient is alert and oriented.  height is 5\' 3"  (1.6 m) and weight is 113 lb 14.4 oz (51.665 kg). Her oral temperature is 98.7 F (37.1 C). Her blood pressure is 117/79 and her pulse is 98. Her respiration is 16. .  The lungs are clear. The heart has a regular rhythm and rate. The abdomen is soft and nontender with normal bowel sounds.  Lab Findings: Lab Results  Component Value Date   WBC 4.2 10/18/2014   HGB 9.9* 10/18/2014   HCT 29.0* 10/18/2014   MCV 97.0 10/18/2014   PLT 220 10/18/2014    Radiographic Findings: No results found.  Impression:  The patient is recovering from the effects of  radiation.   Plan:  Routine follow-up in October. The patient will be seen by gynecologic oncology in July. Today the patient was given a vaginal dilator and instructions on its use in light of her vaginal vault radiation therapy.  ____________________________________ Blair Promise, MD

## 2014-10-22 NOTE — Progress Notes (Signed)
Annette Tucker here for follow up.  She denies pain.  She denies vaginal bleeding and discharge.  She reports nausea from chemotherapy and is taking lorazepam and Zofran which helps.  She reports her taste buds are gone but she is eating.  Her weight is stable.  Her next infusion is on 10/25/14.  She reports having constipation and hemorrhoids.  She is taking colace and miralax.  Her last bowel movement was today.  She reports seeing rectal bleeding a week ago from her hemorrhoids.  She was using proctazone but said it has been irritating the skin on her rectal area.  She has not used it for 2 days.  She denies fatigue and is trying to stay active.  She was taken off of ramipril and feels like she has more energy.  BP 117/79 mmHg  Pulse 98  Temp(Src) 98.7 F (37.1 C) (Oral)  Resp 16  Ht 5\' 3"  (1.6 m)  Wt 113 lb 14.4 oz (51.665 kg)  BMI 20.18 kg/m2

## 2014-10-25 ENCOUNTER — Ambulatory Visit (HOSPITAL_BASED_OUTPATIENT_CLINIC_OR_DEPARTMENT_OTHER): Payer: Medicare HMO

## 2014-10-25 VITALS — BP 143/78 | HR 90 | Temp 97.9°F

## 2014-10-25 DIAGNOSIS — D6481 Anemia due to antineoplastic chemotherapy: Secondary | ICD-10-CM | POA: Diagnosis not present

## 2014-10-25 DIAGNOSIS — C541 Malignant neoplasm of endometrium: Secondary | ICD-10-CM

## 2014-10-25 DIAGNOSIS — Z5111 Encounter for antineoplastic chemotherapy: Secondary | ICD-10-CM | POA: Diagnosis not present

## 2014-10-25 DIAGNOSIS — D500 Iron deficiency anemia secondary to blood loss (chronic): Secondary | ICD-10-CM

## 2014-10-25 LAB — CBC WITH DIFFERENTIAL/PLATELET
BASO%: 0.3 % (ref 0.0–2.0)
Basophils Absolute: 0 10*3/uL (ref 0.0–0.1)
EOS%: 0 % (ref 0.0–7.0)
Eosinophils Absolute: 0 10*3/uL (ref 0.0–0.5)
HCT: 28.8 % — ABNORMAL LOW (ref 34.8–46.6)
HGB: 9.8 g/dL — ABNORMAL LOW (ref 11.6–15.9)
LYMPH%: 7.5 % — ABNORMAL LOW (ref 14.0–49.7)
MCH: 33.7 pg (ref 25.1–34.0)
MCHC: 33.9 g/dL (ref 31.5–36.0)
MCV: 99.4 fL (ref 79.5–101.0)
MONO#: 0.2 10*3/uL (ref 0.1–0.9)
MONO%: 6.1 % (ref 0.0–14.0)
NEUT#: 3.4 10*3/uL (ref 1.5–6.5)
NEUT%: 86.1 % — ABNORMAL HIGH (ref 38.4–76.8)
Platelets: 159 10*3/uL (ref 145–400)
RBC: 2.9 10*6/uL — ABNORMAL LOW (ref 3.70–5.45)
RDW: 19.5 % — ABNORMAL HIGH (ref 11.2–14.5)
WBC: 3.9 10*3/uL (ref 3.9–10.3)
lymph#: 0.3 10*3/uL — ABNORMAL LOW (ref 0.9–3.3)

## 2014-10-25 LAB — COMPREHENSIVE METABOLIC PANEL (CC13)
ALT: 16 U/L (ref 0–55)
AST: 16 U/L (ref 5–34)
Albumin: 3.9 g/dL (ref 3.5–5.0)
Alkaline Phosphatase: 58 U/L (ref 40–150)
Anion Gap: 13 mEq/L — ABNORMAL HIGH (ref 3–11)
BUN: 13.4 mg/dL (ref 7.0–26.0)
CO2: 19 mEq/L — ABNORMAL LOW (ref 22–29)
Calcium: 9.6 mg/dL (ref 8.4–10.4)
Chloride: 106 mEq/L (ref 98–109)
Creatinine: 0.7 mg/dL (ref 0.6–1.1)
EGFR: >90 mL/min/{1.73_m2} (ref >90)
Glucose: 141 mg/dl — ABNORMAL HIGH (ref 70–140)
Potassium: 4.5 mEq/L (ref 3.5–5.1)
Sodium: 138 mEq/L (ref 136–145)
Total Bilirubin: 0.24 mg/dL (ref 0.20–1.20)
Total Protein: 6.5 g/dL (ref 6.4–8.3)

## 2014-10-25 LAB — IRON AND TIBC CHCC
%SAT: 28 % (ref 21–57)
Iron: 86 ug/dL (ref 41–142)
TIBC: 309 ug/dL (ref 236–444)
UIBC: 223 ug/dL (ref 120–384)

## 2014-10-25 MED ORDER — SODIUM CHLORIDE 0.9 % IV SOLN
Freq: Once | INTRAVENOUS | Status: AC
  Administered 2014-10-25: 09:00:00 via INTRAVENOUS

## 2014-10-25 MED ORDER — FAMOTIDINE IN NACL 20-0.9 MG/50ML-% IV SOLN
INTRAVENOUS | Status: AC
  Filled 2014-10-25: qty 50

## 2014-10-25 MED ORDER — SODIUM CHLORIDE 0.9 % IV SOLN
Freq: Once | INTRAVENOUS | Status: AC
  Administered 2014-10-25: 09:00:00 via INTRAVENOUS
  Filled 2014-10-25: qty 50

## 2014-10-25 MED ORDER — DEXAMETHASONE SODIUM PHOSPHATE 100 MG/10ML IJ SOLN
Freq: Once | INTRAMUSCULAR | Status: AC
  Administered 2014-10-25: 09:00:00 via INTRAVENOUS
  Filled 2014-10-25: qty 8

## 2014-10-25 MED ORDER — DIPHENHYDRAMINE HCL 50 MG/ML IJ SOLN: 50.0000 mg | Freq: Once | INTRAMUSCULAR | Status: DC

## 2014-10-25 MED ORDER — FAMOTIDINE IN NACL 20-0.9 MG/50ML-% IV SOLN
20.0000 mg | Freq: Once | INTRAVENOUS | Status: AC
  Administered 2014-10-25: 20 mg via INTRAVENOUS

## 2014-10-25 MED ORDER — SODIUM CHLORIDE 0.9 % IV SOLN
430.0000 mg | Freq: Once | INTRAVENOUS | Status: AC
  Administered 2014-10-25: 430 mg via INTRAVENOUS
  Filled 2014-10-25: qty 43

## 2014-10-25 MED ORDER — PACLITAXEL CHEMO INJECTION 300 MG/50ML
80.0000 mg/m2 | Freq: Once | INTRAVENOUS | Status: AC
  Administered 2014-10-25: 126 mg via INTRAVENOUS
  Filled 2014-10-25: qty 21

## 2014-10-25 NOTE — Progress Notes (Signed)
1110 After Taxol completed and patient's chemotherapy line was flushing patient went to the bathroom. When patient came back from the bathroom IV site checked. Patient stated the site was burning "a little" normal saline wide open with slight swelling noted, no blood return noted. IV site changed before hanging carboplatin, patient tolerated well.

## 2014-10-25 NOTE — Patient Instructions (Signed)
[facility] Discharge Instructions for Patients Receiving Chemotherapy  Today you received the following chemotherapy agents Taxol/Carboplatin To help prevent nausea and vomiting after your treatment, we encourage you to take your nausea medication as prescribed.   If you develop nausea and vomiting that is not controlled by your nausea medication, call the clinic.   BELOW ARE SYMPTOMS THAT SHOULD BE REPORTED IMMEDIATELY:  *FEVER GREATER THAN 100.5 F  *CHILLS WITH OR WITHOUT FEVER  NAUSEA AND VOMITING THAT IS NOT CONTROLLED WITH YOUR NAUSEA MEDICATION  *UNUSUAL SHORTNESS OF BREATH  *UNUSUAL BRUISING OR BLEEDING  TENDERNESS IN MOUTH AND THROAT WITH OR WITHOUT PRESENCE OF ULCERS  *URINARY PROBLEMS  *BOWEL PROBLEMS  UNUSUAL RASH Items with * indicate a potential emergency and should be followed up as soon as possible.  Feel free to call the clinic you have any questions or concerns. The clinic phone number is [phone].  Please show the CHEMO ALERT CARD at check-in to the Emergency Department and triage nurse.   

## 2014-10-26 ENCOUNTER — Ambulatory Visit (HOSPITAL_BASED_OUTPATIENT_CLINIC_OR_DEPARTMENT_OTHER): Payer: Medicare HMO

## 2014-10-26 VITALS — BP 120/68 | HR 80 | Temp 98.3°F

## 2014-10-26 DIAGNOSIS — C55 Malignant neoplasm of uterus, part unspecified: Secondary | ICD-10-CM

## 2014-10-26 DIAGNOSIS — D701 Agranulocytosis secondary to cancer chemotherapy: Secondary | ICD-10-CM

## 2014-10-26 MED ORDER — TBO-FILGRASTIM 300 MCG/0.5ML ~~LOC~~ SOSY
300.0000 ug | PREFILLED_SYRINGE | Freq: Once | SUBCUTANEOUS | Status: AC
  Administered 2014-10-26: 300 ug via SUBCUTANEOUS
  Filled 2014-10-26: qty 0.5

## 2014-10-27 ENCOUNTER — Ambulatory Visit (HOSPITAL_BASED_OUTPATIENT_CLINIC_OR_DEPARTMENT_OTHER): Payer: Medicare HMO

## 2014-10-27 VITALS — BP 132/71 | HR 107 | Temp 97.8°F | Resp 18

## 2014-10-27 DIAGNOSIS — D701 Agranulocytosis secondary to cancer chemotherapy: Secondary | ICD-10-CM | POA: Diagnosis not present

## 2014-10-27 DIAGNOSIS — C55 Malignant neoplasm of uterus, part unspecified: Secondary | ICD-10-CM

## 2014-10-27 MED ORDER — TBO-FILGRASTIM 300 MCG/0.5ML ~~LOC~~ SOSY
300.0000 ug | PREFILLED_SYRINGE | Freq: Once | SUBCUTANEOUS | Status: AC
  Administered 2014-10-27: 300 ug via SUBCUTANEOUS

## 2014-10-27 NOTE — Patient Instructions (Signed)
[facility] Discharge Instructions for Patients Receiving Granix  Today you received the following Granix To help prevent nausea and vomiting after your treatment, we encourage you to take your nausea medication as prescribed by Dr. Marko Plume.    If you develop nausea and vomiting that is not controlled by your nausea medication, call the clinic.   BELOW ARE SYMPTOMS THAT SHOULD BE REPORTED IMMEDIATELY:  *FEVER GREATER THAN 100.5 F  *CHILLS WITH OR WITHOUT FEVER  NAUSEA AND VOMITING THAT IS NOT CONTROLLED WITH YOUR NAUSEA MEDICATION  *UNUSUAL SHORTNESS OF BREATH  *UNUSUAL BRUISING OR BLEEDING  TENDERNESS IN MOUTH AND THROAT WITH OR WITHOUT PRESENCE OF ULCERS  *URINARY PROBLEMS  *BOWEL PROBLEMS  UNUSUAL RASH Items with * indicate a potential emergency and should be followed up as soon as possible.  Feel free to call the clinic you have any questions or concerns. The clinic phone number is [phone].  Please show the [place] at check-in to the Emergency Department and triage nurse.    Tbo-Filgrastim injection What is this medicine? TBO-FILGRASTIM (T B O fil GRA stim) is a granulocyte colony-stimulating factor that stimulates the growth of neutrophils, a type of white blood cell important in the body's fight against infection. It is used to reduce the incidence of fever and infection in patients with certain types of cancer who are receiving chemotherapy that affects the bone marrow. This medicine may be used for other purposes; ask your health care provider or pharmacist if you have questions. COMMON BRAND NAME(S): Granix What should I tell my health care provider before I take this medicine? They need to know if you have any of these conditions: -ongoing radiation therapy -sickle cell anemia -an unusual or allergic reaction to tbo-filgrastim, filgrastim, pegfilgrastim, other medicines, foods, dyes, or preservatives -pregnant or trying to get  pregnant -breast-feeding How should I use this medicine? This medicine is for injection under the skin. If you get this medicine at home, you will be taught how to prepare and give this medicine. Refer to the Instructions for Use that come with your medication packaging. Use exactly as directed. Take your medicine at regular intervals. Do not take your medicine more often than directed. It is important that you put your used needles and syringes in a special sharps container. Do not put them in a trash can. If you do not have a sharps container, call your pharmacist or healthcare provider to get one. Talk to your pediatrician regarding the use of this medicine in children. Special care may be needed. Overdosage: If you think you've taken too much of this medicine contact a poison control center or emergency room at once. Overdosage: If you think you have taken too much of this medicine contact a poison control center or emergency room at once. NOTE: This medicine is only for you. Do not share this medicine with others. What if I miss a dose? It is important not to miss your dose. Call your doctor or health care professional if you miss a dose. What may interact with this medicine? This medicine may interact with the following medications: -medicines that may cause a release of neutrophils, such as lithium This list may not describe all possible interactions. Give your health care provider a list of all the medicines, herbs, non-prescription drugs, or dietary supplements you use. Also tell them if you smoke, drink alcohol, or use illegal drugs. Some items may interact with your medicine. What should I watch for while  using this medicine? You may need blood work done while you are taking this medicine. What side effects may I notice from receiving this medicine? Side effects that you should report to your doctor or health care professional as soon as possible: -allergic reactions like skin rash,  itching or hives, swelling of the face, lips, or tongue -shortness of breath or breathing problems -fever -pain, redness, or irritation at site where injected -pinpoint red spots on the skin -stomach or side pain, or pain at the shoulder -swelling -tiredness -trouble passing urine Side effects that usually do not require medical attention (Report these to your doctor or health care professional if they continue or are bothersome.): -bone pain -muscle pain This list may not describe all possible side effects. Call your doctor for medical advice about side effects. You may report side effects to FDA at 1-800-FDA-1088. Where should I keep my medicine? Keep out of the reach of children. Store in a refrigerator between 2 and 8 degrees C (36 and 46 degrees F). Keep in carton to protect from light. Throw away this medicine if it is left out of the refrigerator for more than 5 consecutive days. Throw away any unused medicine after the expiration date. NOTE: This sheet is a summary. It may not cover all possible information. If you have questions about this medicine, talk to your doctor, pharmacist, or health care provider.  2015, Elsevier/[employer] Standard. (2013-10-05 11:52:29)

## 2014-10-31 ENCOUNTER — Other Ambulatory Visit: Payer: Self-pay | Admitting: Oncology

## 2014-11-01 ENCOUNTER — Encounter: Payer: Self-pay | Admitting: Oncology

## 2014-11-01 ENCOUNTER — Other Ambulatory Visit (HOSPITAL_BASED_OUTPATIENT_CLINIC_OR_DEPARTMENT_OTHER): Payer: Medicare HMO

## 2014-11-01 ENCOUNTER — Ambulatory Visit (HOSPITAL_BASED_OUTPATIENT_CLINIC_OR_DEPARTMENT_OTHER): Payer: Medicare HMO

## 2014-11-01 ENCOUNTER — Ambulatory Visit (HOSPITAL_BASED_OUTPATIENT_CLINIC_OR_DEPARTMENT_OTHER): Payer: Medicare HMO | Admitting: Oncology

## 2014-11-01 ENCOUNTER — Telehealth: Payer: Self-pay | Admitting: Oncology

## 2014-11-01 VITALS — BP 132/82 | HR 109 | Temp 97.8°F | Resp 18 | Ht 63.0 in | Wt 113.3 lb

## 2014-11-01 DIAGNOSIS — C541 Malignant neoplasm of endometrium: Secondary | ICD-10-CM

## 2014-11-01 DIAGNOSIS — D6481 Anemia due to antineoplastic chemotherapy: Secondary | ICD-10-CM

## 2014-11-01 DIAGNOSIS — D701 Agranulocytosis secondary to cancer chemotherapy: Secondary | ICD-10-CM

## 2014-11-01 DIAGNOSIS — T451X5A Adverse effect of antineoplastic and immunosuppressive drugs, initial encounter: Secondary | ICD-10-CM

## 2014-11-01 DIAGNOSIS — Z5111 Encounter for antineoplastic chemotherapy: Secondary | ICD-10-CM | POA: Diagnosis not present

## 2014-11-01 LAB — COMPREHENSIVE METABOLIC PANEL (CC13)
ALT: 18 U/L (ref 0–55)
AST: 16 U/L (ref 5–34)
Albumin: 4.1 g/dL (ref 3.5–5.0)
Alkaline Phosphatase: 68 U/L (ref 40–150)
Anion Gap: 17 mEq/L — ABNORMAL HIGH (ref 3–11)
BUN: 17.2 mg/dL (ref 7.0–26.0)
CO2: 19 mEq/L — ABNORMAL LOW (ref 22–29)
Calcium: 9.7 mg/dL (ref 8.4–10.4)
Chloride: 104 mEq/L (ref 98–109)
Creatinine: 0.8 mg/dL (ref 0.6–1.1)
EGFR: 79 mL/min/{1.73_m2} — ABNORMAL LOW (ref >90)
Glucose: 136 mg/dl (ref 70–140)
Potassium: 4 mEq/L (ref 3.5–5.1)
Sodium: 140 mEq/L (ref 136–145)
Total Bilirubin: 0.42 mg/dL (ref 0.20–1.20)
Total Protein: 6.9 g/dL (ref 6.4–8.3)

## 2014-11-01 LAB — CBC WITH DIFFERENTIAL/PLATELET
BASO%: 0 % (ref 0.0–2.0)
Basophils Absolute: 0 10*3/uL (ref 0.0–0.1)
EOS%: 0 % (ref 0.0–7.0)
Eosinophils Absolute: 0 10*3/uL (ref 0.0–0.5)
HCT: 29.4 % — ABNORMAL LOW (ref 34.8–46.6)
HGB: 10 g/dL — ABNORMAL LOW (ref 11.6–15.9)
LYMPH%: 10.7 % — ABNORMAL LOW (ref 14.0–49.7)
MCH: 34.1 pg — ABNORMAL HIGH (ref 25.1–34.0)
MCHC: 34 g/dL (ref 31.5–36.0)
MCV: 100.3 fL (ref 79.5–101.0)
MONO#: 0.1 10*3/uL (ref 0.1–0.9)
MONO%: 4.9 % (ref 0.0–14.0)
NEUT#: 2.1 10*3/uL (ref 1.5–6.5)
NEUT%: 84.4 % — ABNORMAL HIGH (ref 38.4–76.8)
Platelets: 113 10*3/uL — ABNORMAL LOW (ref 145–400)
RBC: 2.93 10*6/uL — ABNORMAL LOW (ref 3.70–5.45)
RDW: 16.7 % — ABNORMAL HIGH (ref 11.2–14.5)
WBC: 2.4 10*3/uL — ABNORMAL LOW (ref 3.9–10.3)
lymph#: 0.3 10*3/uL — ABNORMAL LOW (ref 0.9–3.3)

## 2014-11-01 MED ORDER — DIPHENHYDRAMINE HCL 50 MG/ML IJ SOLN
50.0000 mg | Freq: Once | INTRAMUSCULAR | Status: AC
  Administered 2014-11-01: 50 mg via INTRAVENOUS

## 2014-11-01 MED ORDER — SODIUM CHLORIDE 0.9 % IV SOLN
Freq: Once | INTRAVENOUS | Status: AC
  Administered 2014-11-01: 10:00:00 via INTRAVENOUS
  Filled 2014-11-01: qty 4

## 2014-11-01 MED ORDER — SODIUM CHLORIDE 0.9 % IV SOLN
Freq: Once | INTRAVENOUS | Status: AC
  Administered 2014-11-01: 10:00:00 via INTRAVENOUS

## 2014-11-01 MED ORDER — FAMOTIDINE IN NACL 20-0.9 MG/50ML-% IV SOLN
20.0000 mg | Freq: Once | INTRAVENOUS | Status: AC
  Administered 2014-11-01: 20 mg via INTRAVENOUS

## 2014-11-01 MED ORDER — DIPHENHYDRAMINE HCL 50 MG/ML IJ SOLN
INTRAMUSCULAR | Status: AC
  Filled 2014-11-01: qty 1

## 2014-11-01 MED ORDER — PACLITAXEL CHEMO INJECTION 300 MG/50ML
80.0000 mg/m2 | Freq: Once | INTRAVENOUS | Status: AC
  Administered 2014-11-01: 126 mg via INTRAVENOUS
  Filled 2014-11-01: qty 21

## 2014-11-01 MED ORDER — FAMOTIDINE IN NACL 20-0.9 MG/50ML-% IV SOLN
INTRAVENOUS | Status: AC
  Filled 2014-11-01: qty 50

## 2014-11-01 NOTE — Patient Instructions (Signed)
[facility] Discharge Instructions for Patients Receiving Chemotherapy  Today you received the following chemotherapy agents:  Taxol  To help prevent nausea and vomiting after your treatment, we encourage you to take your nausea medication as prescribed.   If you develop nausea and vomiting that is not controlled by your nausea medication, call the clinic.   BELOW ARE SYMPTOMS THAT SHOULD BE REPORTED IMMEDIATELY:  *FEVER GREATER THAN 100.5 F  *CHILLS WITH OR WITHOUT FEVER  NAUSEA AND VOMITING THAT IS NOT CONTROLLED WITH YOUR NAUSEA MEDICATION  *UNUSUAL SHORTNESS OF BREATH  *UNUSUAL BRUISING OR BLEEDING  TENDERNESS IN MOUTH AND THROAT WITH OR WITHOUT PRESENCE OF ULCERS  *URINARY PROBLEMS  *BOWEL PROBLEMS  UNUSUAL RASH Items with * indicate a potential emergency and should be followed up as soon as possible.  Feel free to call the clinic you have any questions or concerns. The clinic phone number is [phone].  Please show the CHEMO ALERT CARD at check-in to the Emergency Department and triage nurse.   

## 2014-11-01 NOTE — Progress Notes (Signed)
OFFICE PROGRESS NOTE   Nov 01, 2014   Physicians:P.Gehrig, J.Kinard, M.J.Baxley, K.Fogleman, H.Gruber, Jyothi [place], [place])  INTERVAL HISTORY  Patient is seen, alone for visit, in continuing attention to adjuvant chemotherapy in process for IA serous endometrial carcinoma, due day 8 cycle 6 today. She will have repeat scans and see Dr Alycia Rossetti after completing 6 cycles. She saw Dr Sondra Come in follow up on 10-22-14, instructed on using vaginal dilator.  Patient has had no new or different problems with chemotherapy, very compliant with all medications and care. She has minimal peripheral neuropathy which is not bothersome. Bowels move regularly with bowel program. She manages adequate po intake, using Carnation Instant when cannot tolerate taste otherwise. Nausea is controlled. Energy is adequate. Peripheral IV access continues adequate, tho has been a little more difficult. Felt she had some transient swelling around lips after second granix injection on 10-27-14.  No PAC No genetics testing   ONCOLOGIC HISTORY Patient has had chronic low back pain which continued 01-2014, with new vaginal spotting then. She saw Dr Pamala Hurry, with exam finding very atrophic vagina and hemorrhoids. With continued spotting whe had [place] which showed 4x3x2 cm uterus with 4.8 mm endometrial stripe and 2 small lesions felt to be polyps. D and C 05-17-14 ([id number] found high grade endometrial carcinoma with serous features. CT AP and CXR 05-31-14 showed no evidence of metastatic disease. She was seen by Dr Alycia Rossetti and taken to robotic assisted hysterectomy BSO pelvic and paraaortic nodes 06-05-14. Pathology [phone]) had superficially invasive serous carcinoma, stage IA. Recommendation was adjuvant taxol carboplatin + vaginal brachytherapy. First carbo taxol given 07-12-14, dose dense regimen chosen as patient very anxious about possible chemotherapy side effects. Granix added day after each treatment after day 15 cycle  1 held for neutropenia. Day 8 cycle 4 also held for low white count, granix increased. She had vaginal cuff brachytherapy concomitant with chemotherapy, x 5 treatments from 08-21-14 thru 09-18-14.  Review of systems as above, also: No fever or symptoms of infection. No LE swelling, no bleeding. No SOB or cough. Remainder of 10 point Review of Systems negative.  Objective:  Vital signs in last 24 hours:  BP 132/82 mmHg  Pulse 109  Temp(Src) 97.8 F (36.6 C) (Oral)  Resp 18  Ht 5' 3" (1.6 m)  Wt 113 lb 4.8 oz (51.393 kg)  BMI 20.08 kg/m2 Weight stable. A little pale, not icteric Alert, oriented and appropriate. Ambulatory without difficulty.  Alopecia  HEENT:PERRL, sclerae not icteric. Oral mucosa moist without lesions, posterior pharynx clear.  Neck supple. No JVD.  Lymphatics:no cervical,supraclavicular, axillary or inguinal adenopathy Resp: clear to auscultation bilaterally and normal percussion bilaterally Cardio: regular rate and rhythm. No gallop. GI: soft, nontender, not distended, no mass or organomegaly. Normally active bowel sounds. Surgical incisions not remarkable. Musculoskeletal/ Extremities: without pitting edema, cords, tenderness Neuro: no peripheral neuropathy. Otherwise nonfocal Skin without rash, ecchymosis, petechiae   Lab Results:  Results for orders placed or performed in visit on 11/01/14  CBC with Differential  Result Value Ref Range   WBC 2.4 (L) 3.9 - 10.3 10e3/uL   NEUT# 2.1 1.5 - 6.5 10e3/uL   HGB 10.0 (L) 11.6 - 15.9 g/dL   HCT 29.4 (L) 34.8 - 46.6 %   Platelets 113 (L) 145 - 400 10e3/uL   MCV 100.3 79.5 - 101.0 fL   MCH 34.1 (H) 25.1 - 34.0 pg   MCHC 34.0 31.5 - 36.0 g/dL   RBC 2.93 (L) 3.70 - 5.45  10e6/uL   RDW 16.7 (H) 11.2 - 14.5 %   lymph# 0.3 (L) 0.9 - 3.3 10e3/uL   MONO# 0.1 0.1 - 0.9 10e3/uL   Eosinophils Absolute 0.0 0.0 - 0.5 10e3/uL   Basophils Absolute 0.0 0.0 - 0.1 10e3/uL   NEUT% 84.4 (H) 38.4 - 76.8 %   LYMPH% 10.7 (L) 14.0  - 49.7 %   MONO% 4.9 0.0 - 14.0 %   EOS% 0.0 0.0 - 7.0 %   BASO% 0.0 0.0 - 2.0 %  Comprehensive metabolic panel (Cmet) - [facility]  Result Value Ref Range   Sodium 140 136 - 145 mEq/L   Potassium 4.0 3.5 - 5.1 mEq/L   Chloride 104 98 - 109 mEq/L   CO2 19 (L) 22 - 29 mEq/L   Glucose 136 70 - 140 mg/dl   BUN 17.2 7.0 - 26.0 mg/dL   Creatinine 0.8 0.6 - 1.1 mg/dL   Total Bilirubin 0.42 0.20 - 1.20 mg/dL   Alkaline Phosphatase 68 40 - 150 U/L   AST 16 5 - 34 U/L   ALT 18 0 - 55 U/L   Total Protein 6.9 6.4 - 8.3 g/dL   Albumin 4.1 3.5 - 5.0 g/dL   Calcium 9.7 8.4 - 10.4 mg/dL   Anion Gap 17 (H) 3 - 11 mEq/L   EGFR 79 (L) >90 ml/min/1.73 m2   Iron studies not low on 10-25-14  Studies/Results:  No results found. CT AP requested end of June, prior to seeing Dr Alycia Rossetti on 01-02-15  Medications: I have reviewed the patient's current medications. OK to decrease iron to 2-3x weekly. Not clear that swelling around lips as reported was from granix, as she has tolerated that medication without difficulty multiple times; suggested benadryl if recurs.  DISCUSSION: timing of CT and Dr Elenora Gamma appointment discussed. She understands that she will probably progressively feel better and stronger over next few months after completing treatment, but this will not be immediate.   Assessment/Plan:  1.IA high grade serous endometrial carcinoma: post robotic assisted hysterectomy with BSO and bilateral pelvic and para aortic node evaluation 06-05-2014. Continuing adjuvant chemotherapy, day 8 cycle 6 today, gCSF. I will follow up with counts on 11-29-14. Scans shortly prior to return visit to Dr Alycia Rossetti on 01-02-15. 2.hemorrhoids irritated: as above 3 Latex allergy: noted in EMR 4. Hx HTN with BPs lower and symptomatic per history. Hold ramipril 5.left renal cyst on CT, without concerning features 6.post benign biopsy left breast 1990s, up to date on mammograms  7.benign skin lesions, followed yearly by Dr Hedy Jacob   8. Up to date colonoscopy 9.atherosclerosis by CT 10.lumbar spondylosis with some chronic back discomfort 11.hypertension, elevated lipids followed by PCP 12. flu vaccine done 13.chemo neutropenia: granix as noted 14. chemo anemia: iron studies not particularly low. Continue ferrous fumarate 2-3x weekly and follow   Chemo and granix orders confirmed. All questions answered and patient is in agreement with recommendations and plans. Time spent 25 min including >50% counseling and coordination of care.    LIVESAY,LENNIS P, MD   11/01/2014, 9:13 AM

## 2014-11-01 NOTE — Telephone Encounter (Signed)
pof order for ct noted and patient is aware that she will get a call from central schediling

## 2014-11-02 ENCOUNTER — Ambulatory Visit (HOSPITAL_BASED_OUTPATIENT_CLINIC_OR_DEPARTMENT_OTHER): Payer: Medicare HMO

## 2014-11-02 VITALS — BP 113/81 | HR 79 | Temp 98.2°F

## 2014-11-02 DIAGNOSIS — D701 Agranulocytosis secondary to cancer chemotherapy: Secondary | ICD-10-CM

## 2014-11-02 DIAGNOSIS — C55 Malignant neoplasm of uterus, part unspecified: Secondary | ICD-10-CM

## 2014-11-02 MED ORDER — TBO-FILGRASTIM 300 MCG/0.5ML ~~LOC~~ SOSY
300.0000 ug | PREFILLED_SYRINGE | Freq: Once | SUBCUTANEOUS | Status: AC
  Administered 2014-11-02: 300 ug via SUBCUTANEOUS
  Filled 2014-11-02: qty 0.5

## 2014-11-02 NOTE — Patient Instructions (Signed)

## 2014-11-08 ENCOUNTER — Other Ambulatory Visit (HOSPITAL_BASED_OUTPATIENT_CLINIC_OR_DEPARTMENT_OTHER): Payer: Medicare HMO

## 2014-11-08 ENCOUNTER — Ambulatory Visit (HOSPITAL_BASED_OUTPATIENT_CLINIC_OR_DEPARTMENT_OTHER): Payer: Medicare HMO

## 2014-11-08 ENCOUNTER — Other Ambulatory Visit: Payer: Self-pay | Admitting: Oncology

## 2014-11-08 VITALS — BP 142/88 | HR 100 | Temp 98.6°F

## 2014-11-08 DIAGNOSIS — C541 Malignant neoplasm of endometrium: Secondary | ICD-10-CM

## 2014-11-08 DIAGNOSIS — D701 Agranulocytosis secondary to cancer chemotherapy: Secondary | ICD-10-CM | POA: Diagnosis not present

## 2014-11-08 DIAGNOSIS — C55 Malignant neoplasm of uterus, part unspecified: Secondary | ICD-10-CM

## 2014-11-08 LAB — CBC WITH DIFFERENTIAL/PLATELET
BASO%: 0.3 % (ref 0.0–2.0)
Basophils Absolute: 0 10*3/uL (ref 0.0–0.1)
EOS%: 0 % (ref 0.0–7.0)
Eosinophils Absolute: 0 10*3/uL (ref 0.0–0.5)
HCT: 25.7 % — ABNORMAL LOW (ref 34.8–46.6)
HGB: 8.7 g/dL — ABNORMAL LOW (ref 11.6–15.9)
LYMPH%: 21.8 % (ref 14.0–49.7)
MCH: 35.1 pg — ABNORMAL HIGH (ref 25.1–34.0)
MCHC: 33.6 g/dL (ref 31.5–36.0)
MCV: 104.5 fL — ABNORMAL HIGH (ref 79.5–101.0)
MONO#: 0.1 10*3/uL (ref 0.1–0.9)
MONO%: 8.2 % (ref 0.0–14.0)
NEUT#: 0.5 10*3/uL — CL (ref 1.5–6.5)
NEUT%: 69.7 % (ref 38.4–76.8)
Platelets: 122 10*3/uL — ABNORMAL LOW (ref 145–400)
RBC: 2.46 10*6/uL — ABNORMAL LOW (ref 3.70–5.45)
RDW: 18.3 % — ABNORMAL HIGH (ref 11.2–14.5)
WBC: 0.8 10*3/uL — CL (ref 3.9–10.3)
lymph#: 0.2 10*3/uL — ABNORMAL LOW (ref 0.9–3.3)

## 2014-11-08 LAB — COMPREHENSIVE METABOLIC PANEL (CC13)
ALT: 17 U/L (ref 0–55)
AST: 19 U/L (ref 5–34)
Albumin: 3.8 g/dL (ref 3.5–5.0)
Alkaline Phosphatase: 61 U/L (ref 40–150)
Anion Gap: 12 mEq/L — ABNORMAL HIGH (ref 3–11)
BUN: 13 mg/dL (ref 7.0–26.0)
CO2: 20 mEq/L — ABNORMAL LOW (ref 22–29)
Calcium: 9.3 mg/dL (ref 8.4–10.4)
Chloride: 107 mEq/L (ref 98–109)
Creatinine: 0.7 mg/dL (ref 0.6–1.1)
EGFR: 87 mL/min/{1.73_m2} — ABNORMAL LOW (ref >90)
Glucose: 152 mg/dl — ABNORMAL HIGH (ref 70–140)
Potassium: 4.2 mEq/L (ref 3.5–5.1)
Sodium: 139 mEq/L (ref 136–145)
Total Bilirubin: 0.24 mg/dL (ref 0.20–1.20)
Total Protein: 6.5 g/dL (ref 6.4–8.3)

## 2014-11-08 MED ORDER — TBO-FILGRASTIM 300 MCG/0.5ML ~~LOC~~ SOSY
300.0000 ug | PREFILLED_SYRINGE | Freq: Once | SUBCUTANEOUS | Status: AC
  Administered 2014-11-08: 300 ug via SUBCUTANEOUS
  Filled 2014-11-08: qty 0.5

## 2014-11-08 NOTE — Progress Notes (Signed)
Medical Oncology  ANC 0.5 on 11-08-14, day 15 cycle 6 held/ will not reschedule RN to check vitals and review neutropenic precautions, to give granix 300 today. She will have CBC on 5-13. Parameters for granix under those orders for 5-13 and possibly 5-14.  Godfrey Pick, MD

## 2014-11-08 NOTE — Patient Instructions (Signed)
Neutropenia Neutropenia is a condition that occurs when the level of a certain type of white blood cell (neutrophil) in your body becomes lower than normal. Neutrophils are made in the bone marrow and fight infections. These cells protect against bacteria and viruses. The fewer neutrophils you have, and the longer your body remains without them, the greater your risk of getting a severe infection becomes. CAUSES  The cause of neutropenia may be hard to determine. However, it is usually due to 3 main problems:   Decreased production of neutrophils. This may be due to:  Certain medicines such as chemotherapy.  Genetic problems.  Cancer.  Radiation treatments.  Vitamin deficiency.  Some pesticides.  Increased destruction of neutrophils. This may be due to:  Overwhelming infections.  Hemolytic anemia. This is when the body destroys its own blood cells.  Chemotherapy.  Neutrophils moving to areas of the body where they cannot fight infections. This may be due to:  Dialysis procedures.  Conditions where the spleen becomes enlarged. Neutrophils are held in the spleen and are not available to the rest of the body.  Overwhelming infections. The neutrophils are held in the area of the infection and are not available to the rest of the body. SYMPTOMS  There are no specific symptoms of neutropenia. The lack of neutrophils can result in an infection, and an infection can cause various problems. DIAGNOSIS  Diagnosis is made by a blood test. A complete blood count is performed. The normal level of neutrophils in human blood differs with age and race. Infants have lower counts than older children and adults. African Americans have lower counts than Caucasians or Asians. The average adult level is 1500 cells/mm3 of blood. Neutrophil counts are interpreted as follows:  Greater than 1000 cells/mm3 gives normal protection against infection.  500 to 1000 cells/mm3 gives an increased risk for  infection.  200 to 500 cells/mm3 is a greater risk for severe infection.  Lower than 200 cells/mm3 is a marked risk of infection. This may require hospitalization and treatment with antibiotic medicines. TREATMENT  Treatment depends on the underlying cause, severity, and presence of infections or symptoms. It also depends on your health. Your caregiver will discuss the treatment plan with you. Mild cases are often easily treated and have a good outcome. Preventative measures may also be started to limit your risk of infections. Treatment can include:  Taking antibiotics.  Stopping medicines that are known to cause neutropenia.  Correcting nutritional deficiencies by eating green vegetables to supply folic acid and taking vitamin B supplements.  Stopping exposure to pesticides if your neutropenia is related to pesticide exposure.  Taking a blood growth factor called sargramostim, pegfilgrastim, or filgrastim if you are undergoing chemotherapy for cancer. This stimulates white blood cell production.  Removal of the spleen if you have Felty's syndrome and have repeated infections. HOME CARE INSTRUCTIONS   Follow your caregiver's instructions about when you need to have blood work done.  Wash your hands often. Make sure others who come in contact with you also wash their hands.  Wash raw fruits and vegetables before eating them. They can carry bacteria and fungi.  Avoid people with colds or spreadable (contagious) diseases (chickenpox, herpes zoster, influenza).  Avoid large crowds.  Avoid construction areas. The dust can release fungus into the air.  Be cautious around children in daycare or school environments.  Take care of your respiratory system by coughing and deep breathing.  Bathe daily.  Protect your skin from cuts and   burns.  Do not work in the garden or with flowers and plants.  Care for the mouth before and after meals by brushing with a soft toothbrush. If you have  mucositis, do not use mouthwash. Mouthwash contains alcohol and can dry out the mouth even more.  Clean the area between the genitals and the anus (perineal area) after urination and bowel movements. Women need to wipe from front to back.  Use a water soluble lubricant during sexual intercourse and practice good hygiene after. Do not have intercourse if you are severely neutropenic. Check with your caregiver for guidelines.  Exercise daily as tolerated.  Avoid people who were vaccinated with a live vaccine in the past 30 days. You should not receive live vaccines (polio, typhoid).  Do not provide direct care for pets. Avoid animal droppings. Do not clean litter boxes and bird cages.  Do not share food utensils.  Do not use tampons, enemas, or rectal suppositories unless directed by your caregiver.  Use an electric razor to remove hair.  Wash your hands after handling magazines, letters, and newspapers. SEEK IMMEDIATE MEDICAL CARE IF:   You have a fever.  You have chills or start to shake.  You feel nauseous or vomit.  You develop mouth sores.  You develop aches and pains.  You have redness and swelling around open wounds.  Your skin is warm to the touch.  You have pus coming from your wounds.  You develop swollen lymph nodes.  You feel weak or fatigued.  You develop red streaks on the skin. MAKE SURE YOU:  Understand these instructions.  Will watch your condition.  Will get help right away if you are not doing well or get worse. Document Released: 12/05/2001 Document Revised: 09/07/2011 Document Reviewed: 01/02/2011 [facility] Patient Information 2015 [place], [place]. This information is not intended to replace advice given to you by your health care provider. Make sure you discuss any questions you have with your health care provider. Tbo-Filgrastim injection What is this medicine? TBO-FILGRASTIM (T B O fil GRA stim) is a granulocyte colony-stimulating factor that  stimulates the growth of neutrophils, a type of white blood cell important in the body's fight against infection. It is used to reduce the incidence of fever and infection in patients with certain types of cancer who are receiving chemotherapy that affects the bone marrow. This medicine may be used for other purposes; ask your health care provider or pharmacist if you have questions. COMMON BRAND NAME(S): Granix What should I tell my health care provider before I take this medicine? They need to know if you have any of these conditions: -ongoing radiation therapy -sickle cell anemia -an unusual or allergic reaction to tbo-filgrastim, filgrastim, pegfilgrastim, other medicines, foods, dyes, or preservatives -pregnant or trying to get pregnant -breast-feeding How should I use this medicine? This medicine is for injection under the skin. If you get this medicine at home, you will be taught how to prepare and give this medicine. Refer to the Instructions for Use that come with your medication packaging. Use exactly as directed. Take your medicine at regular intervals. Do not take your medicine more often than directed. It is important that you put your used needles and syringes in a special sharps container. Do not put them in a trash can. If you do not have a sharps container, call your pharmacist or healthcare provider to get one. Talk to your pediatrician regarding the use of this medicine in children. Special care may be needed.  Overdosage: If you think you've taken too much of this medicine contact a poison control center or emergency room at once. Overdosage: If you think you have taken too much of this medicine contact a poison control center or emergency room at once. NOTE: This medicine is only for you. Do not share this medicine with others. What if I miss a dose? It is important not to miss your dose. Call your doctor or health care professional if you miss a dose. What may interact with  this medicine? This medicine may interact with the following medications: -medicines that may cause a release of neutrophils, such as lithium This list may not describe all possible interactions. Give your health care provider a list of all the medicines, herbs, non-prescription drugs, or dietary supplements you use. Also tell them if you smoke, drink alcohol, or use illegal drugs. Some items may interact with your medicine. What should I watch for while using this medicine? You may need blood work done while you are taking this medicine. What side effects may I notice from receiving this medicine? Side effects that you should report to your doctor or health care professional as soon as possible: -allergic reactions like skin rash, itching or hives, swelling of the face, lips, or tongue -shortness of breath or breathing problems -fever -pain, redness, or irritation at site where injected -pinpoint red spots on the skin -stomach or side pain, or pain at the shoulder -swelling -tiredness -trouble passing urine Side effects that usually do not require medical attention (Report these to your doctor or health care professional if they continue or are bothersome.): -bone pain -muscle pain This list may not describe all possible side effects. Call your doctor for medical advice about side effects. You may report side effects to FDA at 1-800-FDA-1088. Where should I keep my medicine? Keep out of the reach of children. Store in a refrigerator between 2 and 8 degrees C (36 and 46 degrees F). Keep in carton to protect from light. Throw away this medicine if it is left out of the refrigerator for more than 5 consecutive days. Throw away any unused medicine after the expiration date. NOTE: This sheet is a summary. It may not cover all possible information. If you have questions about this medicine, talk to your doctor, pharmacist, or health care provider.  2015, Elsevier/Gold Standard. (2013-10-05  11:52:29)

## 2014-11-08 NOTE — Progress Notes (Signed)
Received call from Dr. Marko Plume; no chemo today, granix only. Pt to receive granix tomorrow per CBC parameters.

## 2014-11-09 ENCOUNTER — Ambulatory Visit: Payer: Medicare HMO

## 2014-11-09 ENCOUNTER — Other Ambulatory Visit (HOSPITAL_BASED_OUTPATIENT_CLINIC_OR_DEPARTMENT_OTHER): Payer: Medicare HMO

## 2014-11-09 DIAGNOSIS — C541 Malignant neoplasm of endometrium: Secondary | ICD-10-CM | POA: Diagnosis not present

## 2014-11-09 LAB — CBC WITH DIFFERENTIAL/PLATELET
BASO%: 0.1 % (ref 0.0–2.0)
Basophils Absolute: 0 10*3/uL (ref 0.0–0.1)
EOS%: 0.1 % (ref 0.0–7.0)
Eosinophils Absolute: 0 10*3/uL (ref 0.0–0.5)
HCT: 25.5 % — ABNORMAL LOW (ref 34.8–46.6)
HGB: 8.4 g/dL — ABNORMAL LOW (ref 11.6–15.9)
LYMPH%: 16.2 % (ref 14.0–49.7)
MCH: 34.4 pg — ABNORMAL HIGH (ref 25.1–34.0)
MCHC: 32.9 g/dL (ref 31.5–36.0)
MCV: 104.5 fL — ABNORMAL HIGH (ref 79.5–101.0)
MONO#: 1.2 10*3/uL — ABNORMAL HIGH (ref 0.1–0.9)
MONO%: 11.9 % (ref 0.0–14.0)
NEUT#: 7.1 10*3/uL — ABNORMAL HIGH (ref 1.5–6.5)
NEUT%: 71.7 % (ref 38.4–76.8)
Platelets: 119 10*3/uL — ABNORMAL LOW (ref 145–400)
RBC: 2.44 10*6/uL — ABNORMAL LOW (ref 3.70–5.45)
RDW: 17.6 % — ABNORMAL HIGH (ref 11.2–14.5)
WBC: 9.9 10*3/uL (ref 3.9–10.3)
lymph#: 1.6 10*3/uL (ref 0.9–3.3)

## 2014-11-09 NOTE — Progress Notes (Signed)
Annette Tucker here for labs and possible injection   ANC 7.1 today.  Doesn't need Granix inection today.    HGB 8.4 down slightly from yesterday.  Instructed to let us know if she has any symptoms of increase weakness or SOB before next appointment in 3 weeks, as per Dr Marko Plume.

## 2014-11-21 ENCOUNTER — Other Ambulatory Visit: Payer: Self-pay | Admitting: Internal Medicine

## 2014-11-26 ENCOUNTER — Other Ambulatory Visit: Payer: Self-pay | Admitting: Oncology

## 2014-11-29 ENCOUNTER — Ambulatory Visit (HOSPITAL_BASED_OUTPATIENT_CLINIC_OR_DEPARTMENT_OTHER): Payer: Medicare HMO | Admitting: Oncology

## 2014-11-29 ENCOUNTER — Encounter: Payer: Self-pay | Admitting: Oncology

## 2014-11-29 ENCOUNTER — Ambulatory Visit: Payer: Medicare HMO | Admitting: Gynecologic Oncology

## 2014-11-29 ENCOUNTER — Telehealth: Payer: Self-pay | Admitting: Oncology

## 2014-11-29 ENCOUNTER — Other Ambulatory Visit (HOSPITAL_BASED_OUTPATIENT_CLINIC_OR_DEPARTMENT_OTHER): Payer: Medicare HMO

## 2014-11-29 VITALS — BP 143/64 | HR 86 | Temp 98.6°F | Resp 18 | Ht 63.0 in | Wt 114.7 lb

## 2014-11-29 DIAGNOSIS — C541 Malignant neoplasm of endometrium: Secondary | ICD-10-CM | POA: Diagnosis not present

## 2014-11-29 DIAGNOSIS — D6481 Anemia due to antineoplastic chemotherapy: Secondary | ICD-10-CM | POA: Diagnosis not present

## 2014-11-29 DIAGNOSIS — K649 Unspecified hemorrhoids: Secondary | ICD-10-CM | POA: Diagnosis not present

## 2014-11-29 DIAGNOSIS — T451X5A Adverse effect of antineoplastic and immunosuppressive drugs, initial encounter: Secondary | ICD-10-CM

## 2014-11-29 DIAGNOSIS — M47896 Other spondylosis, lumbar region: Secondary | ICD-10-CM | POA: Diagnosis not present

## 2014-11-29 DIAGNOSIS — G620 Drug-induced polyneuropathy: Secondary | ICD-10-CM

## 2014-11-29 LAB — CBC WITH DIFFERENTIAL/PLATELET
BASO%: 0.7 % (ref 0.0–2.0)
Basophils Absolute: 0 10*3/uL (ref 0.0–0.1)
EOS%: 1.2 % (ref 0.0–7.0)
Eosinophils Absolute: 0.1 10*3/uL (ref 0.0–0.5)
HCT: 33.5 % — ABNORMAL LOW (ref 34.8–46.6)
HGB: 11.2 g/dL — ABNORMAL LOW (ref 11.6–15.9)
LYMPH%: 23 % (ref 14.0–49.7)
MCH: 35 pg — ABNORMAL HIGH (ref 25.1–34.0)
MCHC: 33.4 g/dL (ref 31.5–36.0)
MCV: 104.7 fL — ABNORMAL HIGH (ref 79.5–101.0)
MONO#: 0.7 10*3/uL (ref 0.1–0.9)
MONO%: 16 % — ABNORMAL HIGH (ref 0.0–14.0)
NEUT#: 2.5 10*3/uL (ref 1.5–6.5)
NEUT%: 59.1 % (ref 38.4–76.8)
Platelets: 233 10*3/uL (ref 145–400)
RBC: 3.2 10*6/uL — ABNORMAL LOW (ref 3.70–5.45)
RDW: 14 % (ref 11.2–14.5)
WBC: 4.2 10*3/uL (ref 3.9–10.3)
lymph#: 1 10*3/uL (ref 0.9–3.3)

## 2014-11-29 LAB — COMPREHENSIVE METABOLIC PANEL (CC13)
ALT: 15 U/L (ref 0–55)
AST: 20 U/L (ref 5–34)
Albumin: 3.8 g/dL (ref 3.5–5.0)
Alkaline Phosphatase: 55 U/L (ref 40–150)
Anion Gap: 6 mEq/L (ref 3–11)
BUN: 13.3 mg/dL (ref 7.0–26.0)
CO2: 29 mEq/L (ref 22–29)
Calcium: 9.4 mg/dL (ref 8.4–10.4)
Chloride: 105 mEq/L (ref 98–109)
Creatinine: 0.8 mg/dL (ref 0.6–1.1)
EGFR: 80 mL/min/{1.73_m2} — ABNORMAL LOW (ref >90)
Glucose: 73 mg/dl (ref 70–140)
Potassium: 4.2 mEq/L (ref 3.5–5.1)
Sodium: 140 mEq/L (ref 136–145)
Total Bilirubin: 0.37 mg/dL (ref 0.20–1.20)
Total Protein: 6.6 g/dL (ref 6.4–8.3)

## 2014-11-29 NOTE — Telephone Encounter (Signed)
Appointments made and printed for patient °

## 2014-11-29 NOTE — Progress Notes (Signed)
OFFICE PROGRESS NOTE   November 29, 2014   Physicians:P.Gehrig, J.Kinard, M.J.Baxley, K.Fogleman, H.Gruber, Jyothi Mann, Nunzio Cobbs)  INTERVAL HISTORY:   Patient is seen, alone for visit, in follow up of recently completed adjuvant chemotherapy for IA serous endometrial carcinoma. Day 15 cycle 6 dose dense carbo taxol was held due to neutropenia, such that chemotherapy completed with day 8 cycle 6 on 11-01-14. She will have restaging CT late June prior to seeing Dr Alycia Rossetti on 01-02-15; next appointment with Dr Sondra Come is 04-11-15.   Patient can already tell improvement in energy and overall status since off chemotherapy. She has slight tingling in left foot, but no peripheral neuropathy noticeable otherwise now. Appetite is good and taste improved. She does not need antiemetics. Bowels are moving well with diet and hemorrhoids are not bothersome. She is walking 2 miles daily on treadmill, tho not as quickly as prior to illness. BP still off antihypertensives at home in AMs 120s/ low 90s, however by lunch or later <100-110/ 60s - 70.  No PAC No genetics testing   ONCOLOGIC HISTORY Patient has had chronic low back pain which continued 01-2014, with new vaginal spotting then. She saw Dr Pamala Hurry, with exam finding very atrophic vagina and hemorrhoids. With continued spotting whe had [place] which showed 4x3x2 cm uterus with 4.8 mm endometrial stripe and 2 small lesions felt to be polyps. D and C 05-17-14 ([id number] found high grade endometrial carcinoma with serous features. CT AP and CXR 05-31-14 showed no evidence of metastatic disease. She was seen by Dr Alycia Rossetti and taken to robotic assisted hysterectomy BSO pelvic and paraaortic nodes 06-05-14. Pathology [phone]) had superficially invasive serous carcinoma, stage IA. Recommendation was adjuvant taxol carboplatin + vaginal brachytherapy. First carbo taxol given 07-12-14, dose dense regimen chosen as patient very anxious about possible chemotherapy side  effects. Granix added day after each treatment after day 15 cycle 1 held for neutropenia. Day 8 cycle 4 also held for low white count, granix increased. She had vaginal cuff brachytherapy concomitant with chemotherapy, x 5 treatments from 08-21-14 thru 09-18-14. She completed chemotherapy with day 8 cycle 6 dose dense carbo taxol on 11-01-2014 (day 15 omitted due to neutropenia despite granix).      Review of systems as above, also: No fever or symptoms of infection. No bleeding. No LE swelling. Sleeping adequately. Badder ok. No abdominal or pelvic pain. Remainder of 10 point Review of Systems negative.  Objective:  Vital signs in last 24 hours:  BP 143/64 mmHg  Pulse 86  Temp(Src) 98.6 F (37 C) (Oral)  Resp 18  Ht '5\' 3"'  (1.6 m)  Wt 114 lb 11.2 oz (52.028 kg)  BMI 20.32 kg/m2  SpO2 100% Weight up 1.5 lbs. Alert, oriented and appropriate, looks comfortable. Ambulatory without difficulty. Respirations not labored RA. Alopecia  HEENT:PERRL, sclerae not icteric. Oral mucosa moist without lesions, posterior pharynx clear.  Neck supple. No JVD.  Lymphatics:no cervical,supaclavicular, axillary or inguinal adenopathy Resp: clear to auscultation bilaterally and normal percussion bilaterally Cardio: regular rate and rhythm. No gallop. GI: soft, nontender, not distended, no mass or organomegaly. Normally active bowel sounds. Surgical incisions not remarkable. Musculoskeletal/ Extremities: without pitting edema, cords, tenderness Neuro: no significant peripheral neuropathy. Otherwise nonfocal. Psych appropriate mood and affect. Skin without rash, ecchymosis, petechiae    Lab Results:  Results for orders placed or performed in visit on 11/29/14  CBC with Differential  Result Value Ref Range   WBC 4.2 3.9 - 10.3 10e3/uL   NEUT# 2.5  1.5 - 6.5 10e3/uL   HGB 11.2 (L) 11.6 - 15.9 g/dL   HCT 33.5 (L) 34.8 - 46.6 %   Platelets 233 145 - 400 10e3/uL   MCV 104.7 (H) 79.5 - 101.0 fL   MCH 35.0  (H) 25.1 - 34.0 pg   MCHC 33.4 31.5 - 36.0 g/dL   RBC 3.20 (L) 3.70 - 5.45 10e6/uL   RDW 14.0 11.2 - 14.5 %   lymph# 1.0 0.9 - 3.3 10e3/uL   MONO# 0.7 0.1 - 0.9 10e3/uL   Eosinophils Absolute 0.1 0.0 - 0.5 10e3/uL   Basophils Absolute 0.0 0.0 - 0.1 10e3/uL   NEUT% 59.1 38.4 - 76.8 %   LYMPH% 23.0 14.0 - 49.7 %   MONO% 16.0 (H) 0.0 - 14.0 %   EOS% 1.2 0.0 - 7.0 %   BASO% 0.7 0.0 - 2.0 %   CMET available after visit normal with exception of EGFR 80 (creat 0.8, BUN 13.3)  Studies/Results:  No results found.   CT AP pending 12-27-14  Patient to set up mammograms. She is interested in repeat bone density scan, last ~ 4 years ago.  Medications: I have reviewed the patient's current medications. She has completed iron prescription so will stop that now.  DISCUSSION: all of interval information as above. Discussed follow up alternating gyn oncology and radiation oncology, with prn follow up with medical oncology. I will recheck labs when she is at [facility] for Dr Elenora Gamma visit in early July. She will have PE with Dr Renold Genta in late August. Fine for flu vaccine and pneumovax this fall. Patient to set up mammograms. She knows that I am glad to see her at any time if she or other MDs request.  Assessment/Plan: 1.IA high grade serous endometrial carcinoma: post robotic assisted hysterectomy with BSO and bilateral pelvic and para aortic node evaluation 06-05-2014.  She is recovering well from adjuvant chemotherapy, this given with concomitant vaginal brachytherapy and all completed 11-01-14. Scans shortly prior to return visit to Dr Alycia Rossetti on 01-02-15. She will likely alternate visits with gyn oncology and radiation oncology; I am glad to see her again at any time if needed, but have not scheduled routine follow up also at this office.  2.hemorrhoids irritated: as above 3 Latex allergy: noted in EMR 4. Hx HTN with BPs lower and symptomatic during chemotherapy. Holding ramipril and following BPs at  home. She will discuss with Dr Renold Genta at next visit there or sooner if concerns. PCP following lipids. 5.left renal cyst on CT, without concerning features 6.post benign biopsy left breast 1990s,  Yearly mammograms 7.benign skin lesions, followed yearly by Dr Hedy Jacob  8. Up to date colonoscopy 9.atherosclerosis by CT 10.lumbar spondylosis with some chronic back discomfort 11.Chemo thrombocytopenia resolved. Chemo anemia much improved, follow up labs in July when she sees Dr Alycia Rossetti. WBC good.   All questions answered. Patient is comfortable with discussion and with recommendations/ plans. Time spent 25 min including >50% counseling and coordination of care     LIVESAY,LENNIS P, MD   11/29/2014, 8:50 AM

## 2014-11-30 ENCOUNTER — Encounter: Payer: Self-pay | Admitting: Oncology

## 2014-11-30 NOTE — Progress Notes (Signed)
[place] END OF TREATMENT   Name: Annette Tucker Date: November 30, 2014  MRN: [id number] DOB: 08/04/1945   TREATMENT DATES: 07-12-2014 thru 11-01-2014   REFERRING PHYSICIAN: Nancy Marus  DIAGNOSIS: serous endometrial carcinoma  STAGE AT START OF TREATMENT: IA   INTENT: curative   DRUGS OR REGIMENS GIVEN: dose dense carboplatin taxol  MAJOR TOXICITIES: chemotherapy associated neutropenia and anemia   REASON TREATMENT STOPPED: completion of planned course   PERFORMANCE STATUS AT END: 1   ONGOING PROBLEMS: mild anemia   FOLLOW UP PLANS: restaging scans and follow up with gyn oncology and radiation oncology

## 2014-12-24 ENCOUNTER — Other Ambulatory Visit: Payer: Self-pay

## 2014-12-27 ENCOUNTER — Encounter (HOSPITAL_COMMUNITY): Payer: Self-pay

## 2014-12-27 ENCOUNTER — Ambulatory Visit (HOSPITAL_COMMUNITY)
Admission: RE | Admit: 2014-12-27 | Discharge: 2014-12-27 | Disposition: A | Discharge status: Home / Self Care | Payer: Medicare HMO | Source: Ambulatory Visit | Attending: Oncology | Admitting: Oncology

## 2014-12-27 DIAGNOSIS — Z9221 Personal history of antineoplastic chemotherapy: Secondary | ICD-10-CM | POA: Diagnosis not present

## 2014-12-27 DIAGNOSIS — C541 Malignant neoplasm of endometrium: Secondary | ICD-10-CM | POA: Insufficient documentation

## 2014-12-27 DIAGNOSIS — Z923 Personal history of irradiation: Secondary | ICD-10-CM | POA: Insufficient documentation

## 2014-12-27 MED ORDER — IOHEXOL 300 MG/ML  SOLN
100.0000 mL | Freq: Once | INTRAMUSCULAR | Status: AC | PRN
  Administered 2014-12-27: 100 mL via INTRAVENOUS

## 2014-12-27 MED ORDER — IOHEXOL 300 MG/ML  SOLN
50.0000 mL | Freq: Once | INTRAMUSCULAR | Status: AC | PRN
  Administered 2014-12-27: 50 mL via ORAL

## 2015-01-02 ENCOUNTER — Other Ambulatory Visit (HOSPITAL_BASED_OUTPATIENT_CLINIC_OR_DEPARTMENT_OTHER): Payer: Medicare HMO

## 2015-01-02 ENCOUNTER — Encounter: Payer: Self-pay | Admitting: Gynecologic Oncology

## 2015-01-02 ENCOUNTER — Ambulatory Visit: Payer: Medicare HMO | Attending: Gynecologic Oncology | Admitting: Gynecologic Oncology

## 2015-01-02 VITALS — BP 133/92 | HR 85 | Temp 98.5°F | Resp 18 | Ht 63.0 in | Wt 113.9 lb

## 2015-01-02 DIAGNOSIS — C541 Malignant neoplasm of endometrium: Secondary | ICD-10-CM

## 2015-01-02 LAB — COMPREHENSIVE METABOLIC PANEL (CC13)
ALT: 15 U/L (ref 0–55)
AST: 22 U/L (ref 5–34)
Albumin: 4.2 g/dL (ref 3.5–5.0)
Alkaline Phosphatase: 60 U/L (ref 40–150)
Anion Gap: 9 mEq/L (ref 3–11)
BUN: 17 mg/dL (ref 7.0–26.0)
CO2: 26 mEq/L (ref 22–29)
Calcium: 10 mg/dL (ref 8.4–10.4)
Chloride: 104 mEq/L (ref 98–109)
Creatinine: 0.8 mg/dL (ref 0.6–1.1)
EGFR: 79 mL/min/{1.73_m2} — ABNORMAL LOW (ref >90)
Glucose: 87 mg/dl (ref 70–140)
Potassium: 4.2 mEq/L (ref 3.5–5.1)
Sodium: 140 mEq/L (ref 136–145)
Total Bilirubin: 0.32 mg/dL (ref 0.20–1.20)
Total Protein: 7.1 g/dL (ref 6.4–8.3)

## 2015-01-02 LAB — CBC WITH DIFFERENTIAL/PLATELET
BASO%: 0.7 % (ref 0.0–2.0)
Basophils Absolute: 0 10*3/uL (ref 0.0–0.1)
EOS%: 2.5 % (ref 0.0–7.0)
Eosinophils Absolute: 0.1 10*3/uL (ref 0.0–0.5)
HCT: 38.2 % (ref 34.8–46.6)
HGB: 12.7 g/dL (ref 11.6–15.9)
LYMPH%: 21.1 % (ref 14.0–49.7)
MCH: 33.1 pg (ref 25.1–34.0)
MCHC: 33.1 g/dL (ref 31.5–36.0)
MCV: 99.9 fL (ref 79.5–101.0)
MONO#: 0.5 10*3/uL (ref 0.1–0.9)
MONO%: 10.8 % (ref 0.0–14.0)
NEUT#: 3.2 10*3/uL (ref 1.5–6.5)
NEUT%: 64.9 % (ref 38.4–76.8)
Platelets: 195 10*3/uL (ref 145–400)
RBC: 3.82 10*6/uL (ref 3.70–5.45)
RDW: 12.7 % (ref 11.2–14.5)
WBC: 4.9 10*3/uL (ref 3.9–10.3)
lymph#: 1 10*3/uL (ref 0.9–3.3)

## 2015-01-02 NOTE — Patient Instructions (Signed)
Plan to follow up in Jan with [name] with Dr. Sondra Come in between as scheduled.  Please call for any questions or concerns.

## 2015-01-02 NOTE — Progress Notes (Signed)
Consult Note: Gyn-Onc  Annette Tucker 69 y.o. female  CC:  Chief Complaint  Patient presents with  . endometrial cancer    followup    HPI: Patient was initially seen in consultation at the request of Dr. Pamala Hurry.  Patient is a 69 year old gravida 0 who began experiencing some low back pain and then spotting starting in August.. She had an exam which showed significant atrophy and hormone cream was prescribed. She then continued to have some spotting and discharge went to see Dr. Pamala Hurry. Ultrasound was performed that revealed a 4 x 3 x 2 centimeter uterus with a 4.8 milliliter endometrial stripe. There were 2 small lesions measuring 0.5 cm each consistent with polyps. She had been on hormone replacement therapy until 2012. She had been on it approximately 10 years. Pathology from her D&C revealed high-grade endometrial cancer with serous features. For this reason that she is referred to [place] today.  She states since the [facility] she's been feeling well. The low back pain is fairly chronic for her but that was slightly different which is what prompted her visit. She does have a family history of cancer. She has a maternal aunt with postmenopausal breast cancer. She's a paternal first cousin with postmenopausal breast cancer. She is up-to-date on her mammograms. Her last one was in October. She's due for colonoscopy in 2016. Her primary physician is Dr. Tedra Senegal.  06/05/14: Surgery: Total robotic hysterectomy bilateral salpingo-oophorectomy, bilateral pelvic and para-aortic lymph node dissection.   Operative findings: Normal uterus, cervix, adnexal and abdominal survey, No pathologically enlarged nodes identified.   Pathology:  Diagnosis 1. Lymph node, biopsy, right para aortic nodes - ONE LYMPH NODE, NEGATIVE FOR METASTATIC CARCINOMA (0/1). 2. Lymph node, biopsy, left para aortic node - THREE LYMPH NODES, NEGATIVE FOR METASTATIC CARCINOMA (0/3). 3. Lymph nodes, regional resection, right  pelvic - FOUR LYMPH NODES, NEGATIVE FOR METASTATIC CARCINOMA (0/4). 4. Lymph nodes, regional resection, left pelvic - FOUR LYMPH NODES, NEGATIVE FOR METASTATIC CARCINOMA (0/4). 5. Uterus +/- tubes/ovaries, neoplastic, with cervix - SUPERFICIALLY INVASIVE SEROUS CARCINOMA, 0.5 CM, CONFINED WITHIN THE INNER HALF OF THE MYOMETRIUM. (20%, no LVIS) - ADJACENT BENIGN ENDOMETRIAL POLYPS WITH NO EVIDENCE OF ATYPIA OR MALIGNANCY. - CERVIX: BENIGN SQUAMOUS MUCOSA AND ENDOCERVICAL MUCOSA, NO DYSPLASIA OR MALIGNANCY. - BILATERAL OVARIES: BENIGN OVARIAN TISSUE WITH ENDOMETRIOSIS AND ENDOSALPINGIOSIS, NO ATYPIA OR MALIGNANCY. - BILATERAL FALLOPIAN TUBES: BENIGN FALLOPIAN TUBAL TISSUE, NO EVIDENCE OF ATYPIA OR MALIGNANCY. - MYOMETRIUM: LEIOMYOMATA.  We saw her for her postoperative check in the latter part of January. Since that time she's completed 6 cycles of dose dense paclitaxel and carboplatin. She completed day #8 of cycle #6 on May 5. In addition, she's completed her high-dose vaginal cuff brachii therapy with Dr. Sondra Come.  She had a posttreatment CT scan that revealed (12/27/14): FINDINGS: Lower chest: Lung bases are clear.  Hepatobiliary: No focal hepatic lesion. No biliary duct dilatation.Gallbladder is normal. Common bile duct is normal.  Pancreas: Pancreas is normal. No ductal dilatation. No pancreatic inflammation.  Spleen: Linear hypodense band within the mid spleen extending from the hilum to the capsule (image 12, series 2) most suggestive of a splenic infarction. Splenic laceration could have a similar pattern. No evidence of acute injury.  Adrenals/urinary tract: Adrenal glands are normal. There is a nonenhancing cyst of the left kidney. Ureters are normal. The bladder is normal.  Stomach/Bowel: Stomach, small-bowel, and cecum normal. The colon and rectosigmoid colon normal. There is a large stool ball in the  rectum measuring 7 cm in diameter.  Vascular/Lymphatic: Abdominal aorta  is normal caliber. There is minimal of vascular intimal calcification. Splenic artery appears normal.  There is no retroperitoneal lymphadenopathy. No periportal adenopathy. No mesenteric adenopathy. No pelvic lymphadenopathy.  Reproductive: Post hysterectomy anatomy. No adnexal abnormality.  Musculoskeletal: No aggressive osseous lesion.  Other: No free fluid.  IMPRESSION: 1. No evidence of endometrial carcinoma recurrence within the abdomen or pelvis. 2. New linear band within the spleen is most suggestive of a splenic infarction. 3. Probable benign left renal cyst. 4. Large stool ball in the rectum.  Review the results of her CT and discuss the area of possible splenic infarction. She overall feels that she is recovering from her chemotherapy. She is now walking 2 miles a day. She is due for a mammogram, bone density, and colonoscopy. She can go ahead and get all of those scheduled. She is seeing Dr. Freddi Che in October for follow-up. She's currently off her blood pressure medications and checking her blood pressures at home. She only has slight residual neuropathy on her left foot but otherwise really feels no symptoms associated with her chemotherapy treatment.  Review of Systems  Constitutional: Denies fever. Skin: No rash Cardiovascular: No chest pain, shortness of breath, or edema  Pulmonary: No cough   Gastro Intestinal: No nausea, vomiting, constipation, or diarrhea reported. No bright red blood per rectum or change in bowel movement.  Genitourinary: Denies vaginal bleeding and discharge.  Musculoskeletal: No myalgia Neurologic: Slight numbness of left foot  Psychology: No changes   Current Meds:  Outpatient Encounter Prescriptions as of 01/02/2015  Medication Sig  . acetaminophen (TYLENOL) 500 MG tablet Take 500 mg by mouth once as needed for moderate pain.   Marland Kitchen aspirin 81 MG EC tablet Take 81 mg by mouth every evening.   . Calcium Carbonate-Vitamin D (CALCIUM 600+D)  600-400 MG-UNIT per tablet Take 1 tablet by mouth 2 (two) times daily. @lunch  and in the evening  . cholecalciferol (VITAMIN D) 1000 UNITS tablet Take 1,000 Units by mouth daily.   . Coenzyme Q10 (CO Q-10) 100 MG CAPS Take 1 capsule by mouth every morning.   . fish oil-omega-3 fatty acids 1000 MG capsule Take 1 g by mouth every morning.   . Flaxseed, Linseed, (FLAX SEED OIL PO) Take 1,000 Units by mouth every evening.   . loratadine (CLARITIN) 10 MG tablet Take 10 mg by mouth daily as needed for allergies.   . Magnesium 250 MG TABS Take 1 tablet by mouth every evening.   . Multiple Vitamins-Minerals (MULTIVITAMIN WITH MINERALS) tablet Take 1 tablet by mouth every morning.   Marland Kitchen PROCTOZONE-HC 2.5 % rectal cream Place 1 application rectally as needed.   . simvastatin (ZOCOR) 20 MG tablet TAKE 1 TABLET (20 MG TOTAL) BY MOUTH AT BEDTIME.  . vitamin E 400 UNIT capsule Take 400 Units by mouth every morning.   . Ferrous Fumarate-Folic Acid 630-1 MG TABS Take 1 tablet on an empty stomach with OJ. (Patient not taking: Reported on 01/02/2015)  . ibuprofen (ADVIL,MOTRIN) 600 MG tablet Take 1 tablet (600 mg total) by mouth every 6 (six) hours as needed for mild pain. (Patient not taking: Reported on 01/02/2015)  . [DISCONTINUED] dexamethasone (DECADRON) 4 MG tablet TAKE 4 TABS = 16 MG WITH FOOD 12 HRS PRIOR TO TAXOL CHEMOTHERAPY (Patient not taking: Reported on 11/29/2014)  . [DISCONTINUED] docusate sodium (COLACE) 100 MG capsule Take 100 mg by mouth daily.  . [DISCONTINUED] LORazepam (ATIVAN) 0.5 MG  tablet Place 1 tablet under the tongue or swallow every 6 hours as needed for nausea. Will make drowsy. Take 1 tablet night of first chemo whether or not any nausea. (Patient not taking: Reported on 11/29/2014)  . [DISCONTINUED] ondansetron (ZOFRAN) 8 MG tablet Take 1 tab every 8 hrs as needed for nausea. Take morning after chemo whether or not any nausea. (Patient not taking: Reported on 11/29/2014)  . [DISCONTINUED]  polyethylene glycol (MIRALAX / GLYCOLAX) packet Take 17 g by mouth daily.  . [DISCONTINUED] ramipril (ALTACE) 5 MG capsule TAKE 1 CAPSULE (5 MG TOTAL) BY MOUTH DAILY. (Patient not taking: Reported on 10/18/2014)   No facility-administered encounter medications on file as of 01/02/2015.    Allergy:  Allergies  Allergen Reactions  . Latex Rash    Social Hx:   History   Social History  . Marital Status: Single    Spouse Name: N/A  . Number of Children: 0  . Years of Education: N/A   Occupational History  . retired Scientist, research (medical)     Social History Main Topics  . Smoking status: Never Smoker   . Smokeless tobacco: Never Used  . Alcohol Use: Yes     Comment: rarely  . Drug Use: No  . Sexual Activity: No   Other Topics Concern  . Not on file   Social History Narrative    Past Surgical Hx:  Past Surgical History  Procedure Laterality Date  . Tonsillectomy      age 44  . Tmj mandibular advancement    . Wisdom tooth extraction    . Breast surgery      left breast bx - benign  . Colonoscopy  2006  . Dilatation & curettage/hysteroscopy with trueclear N/A 05/17/2014    Procedure: HYSTEROSCOPY WITH POLYPECTOMY;  Surgeon: Claiborne Billings A. Pamala Hurry, MD;  Location: [facility];  Service: Gynecology;  Laterality: N/A;  . Robotic assisted total hysterectomy with bilateral salpingo oopherectomy N/A 06/05/2014    Procedure: ROBOTIC ASSISTED TOTAL HYSTERECTOMY WITH BILATERAL SALPINGO OOPHORECTOMY;  Surgeon: Imagene Gurney A. Alycia Rossetti, MD;  Location: [facility];  Service: Gynecology;  Laterality: N/A;  . Lymph node dissection N/A 06/05/2014    Procedure: LYMPH NODE DISSECTION;  Surgeon: Imagene Gurney A. Alycia Rossetti, MD;  Location: [facility];  Service: Gynecology;  Laterality: N/A;    Past Medical Hx:  Past Medical History  Diagnosis Date  . Hyperlipidemia   . Hypertension   . Heart murmur     as child, no problems   . Seasonal allergies   . Radiation 08/21/14, 08/28/14, 09/04/14, 09/06/14, 09/18/14    vaginal cuff brachytherapy 30 gray   . Cancer     ENDOMETRIAL CANCER    Oncology Hx:    Endometrial ca   05/17/2014 Initial Diagnosis Endometrial ca   06/05/2014 Surgery TRH/BSO and staging. IAUPSC 0/12 nodes, no LVSI   07/12/2014 -  Chemotherapy paclitaxel and carboplatin   08/21/2014 - 09/18/2014 Radiation Therapy     Uterine cancer   05/17/2014 Initial Diagnosis Uterine cancer    - 11/01/2014 Chemotherapy Completed 6 cycles of paclitaxel and carboplatin based chemotherapy   06/05/2014 Surgery IA UPSC    Radiation Therapy     Family Hx:  Family History  Problem Relation Age of Onset  . Mental retardation Mother   . Stroke Mother   . Kidney disease Mother     Vitals:  Blood pressure 133/92, pulse 85, temperature 98.5 F (36.9 C), temperature source Oral, resp. rate 18, height 5\' 3"  (1.6 m), weight  113 lb 14.4 oz (51.665 kg), SpO2 99 %.  Physical Exam: Well-nourished well-developed female in no acute distress.  Neck: Supple, no lymphadenopathy, no thyromegaly.  Lungs: Clear to auscultation bilateral.  Cardiac: Regular rate and rhythm  Abdomen: Soft, nontender, nondistended. There are no palpable masses or hepatosplenomegaly. Well-healed surgical incisions with no evidence of any incisional hernias.  Extremities: No edema.  Pelvic: Normal female genitalia. The vagina is markedly atrophic. The vaginal cuff is without lesions.Bimanual examination reveals no tenderness, fluctuance or masses. Rectal confirms  Assessment/Plan:  69 year old with a stage IA grade 3 uterine serous carcinoma of the endometrium. Her preoperative staging was negative. Her surgical staging was similarly negative. She's been treated adjuvantly with dose dense paclitaxel and carboplatin. She's completed all of her therapy with her last cycle of chemotherapy being May 5. She's had a negative posttreatment CT scan. We reviewed signs and symptoms of recurrent endometrial cancer. I agree with her being proactive with her health care and schedule  her for appointments. She will follow-up with Dr. Sondra Come in October and return to see me in January. I reviewed the issue with the incidental finding of a splenic infarction does not appear to be of any significant sequela as she is not having any symptoms of infection or pain  Lesly Pontarelli A., MD 01/02/2015, 2:17 PM

## 2015-01-04 ENCOUNTER — Telehealth: Payer: Self-pay

## 2015-01-04 NOTE — Telephone Encounter (Signed)
-----   Message from Gordy Levan, MD sent at 01/02/2015  3:48 PM EDT ----- Labs seen and need follow up please let her know labs are good, including hemoglobin up to 12.7

## 2015-01-04 NOTE — Telephone Encounter (Signed)
LM in cell phone VM stating labs results as  noted below by Dr. Marko Plume.  Ms. Annette Tucker can call the [facility] if she has any questions.

## 2015-02-06 ENCOUNTER — Telehealth: Payer: Self-pay | Admitting: Gynecologic Oncology

## 2015-02-06 DIAGNOSIS — C569 Malignant neoplasm of unspecified ovary: Secondary | ICD-10-CM

## 2015-02-06 DIAGNOSIS — D735 Infarction of spleen: Secondary | ICD-10-CM

## 2015-02-06 NOTE — Telephone Encounter (Signed)
Patient informed of Dr. Elenora Gamma recommendations for repeat CT scan in six months or sooner if symptoms arise to evaluate splenic infarction seen on most recent CT.  Patient to arrange CT when she schedules appt with [name].  Advised to call for any questions or concerns or if symptoms arise.

## 2015-02-20 ENCOUNTER — Other Ambulatory Visit: Payer: Self-pay | Admitting: Internal Medicine

## 2015-03-05 ENCOUNTER — Other Ambulatory Visit: Payer: Medicare HMO | Admitting: Internal Medicine

## 2015-03-05 DIAGNOSIS — R5383 Other fatigue: Secondary | ICD-10-CM

## 2015-03-05 DIAGNOSIS — D509 Iron deficiency anemia, unspecified: Secondary | ICD-10-CM

## 2015-03-05 DIAGNOSIS — E785 Hyperlipidemia, unspecified: Secondary | ICD-10-CM

## 2015-03-05 DIAGNOSIS — Z79899 Other long term (current) drug therapy: Secondary | ICD-10-CM

## 2015-03-05 DIAGNOSIS — E559 Vitamin D deficiency, unspecified: Secondary | ICD-10-CM

## 2015-03-05 LAB — CBC WITH DIFFERENTIAL/PLATELET
Basophils Absolute: 0 10*3/uL (ref 0.0–0.1)
Basophils Relative: 1 % (ref 0–1)
Eosinophils Absolute: 0.1 10*3/uL (ref 0.0–0.7)
Eosinophils Relative: 2 % (ref 0–5)
HCT: 42.2 % (ref 36.0–46.0)
Hemoglobin: 14.2 g/dL (ref 12.0–15.0)
Lymphocytes Relative: 32 % (ref 12–46)
Lymphs Abs: 1.1 10*3/uL (ref 0.7–4.0)
MCH: 30.5 pg (ref 26.0–34.0)
MCHC: 33.6 g/dL (ref 30.0–36.0)
MCV: 90.6 fL (ref 78.0–100.0)
MPV: 10.3 fL (ref 8.6–12.4)
Monocytes Absolute: 0.5 10*3/uL (ref 0.1–1.0)
Monocytes Relative: 14 % — ABNORMAL HIGH (ref 3–12)
Neutro Abs: 1.7 10*3/uL (ref 1.7–7.7)
Neutrophils Relative %: 51 % (ref 43–77)
Platelets: 234 10*3/uL (ref 150–400)
RBC: 4.66 MIL/uL (ref 3.87–5.11)
RDW: 13.2 % (ref 11.5–15.5)
WBC: 3.3 10*3/uL — ABNORMAL LOW (ref 4.0–10.5)

## 2015-03-05 LAB — COMPLETE METABOLIC PANEL WITH GFR
ALT: 14 U/L (ref 6–29)
AST: 19 U/L (ref 10–35)
Albumin: 4.5 g/dL (ref 3.6–5.1)
Alkaline Phosphatase: 70 U/L (ref 33–130)
BUN: 15 mg/dL (ref 7–25)
CO2: 28 mmol/L (ref 20–31)
Calcium: 9.7 mg/dL (ref 8.6–10.4)
Chloride: 103 mmol/L (ref 98–110)
Creat: 0.72 mg/dL (ref 0.50–0.99)
GFR, Est African American: >89 mL/min (ref >=60)
GFR, Est Non African American: 88 mL/min (ref >=60)
Glucose, Bld: 82 mg/dL (ref 65–99)
Potassium: 4.4 mmol/L (ref 3.5–5.3)
Sodium: 141 mmol/L (ref 135–146)
Total Bilirubin: 0.4 mg/dL (ref 0.2–1.2)
Total Protein: 7.5 g/dL (ref 6.1–8.1)

## 2015-03-05 LAB — LIPID PANEL
Cholesterol: 196 mg/dL (ref 125–200)
HDL: 74 mg/dL (ref >=46)
LDL Cholesterol: 107 mg/dL (ref <130)
Total CHOL/HDL Ratio: 2.6 Ratio (ref <=5.0)
Triglycerides: 76 mg/dL (ref <150)
VLDL: 15 mg/dL (ref <30)

## 2015-03-05 LAB — TSH: TSH: 1.662 u[IU]/mL (ref 0.350–4.500)

## 2015-03-06 LAB — VITAMIN D 25 HYDROXY (VIT D DEFICIENCY, FRACTURES): Vit D, 25-Hydroxy: 45 ng/mL (ref 30–100)

## 2015-03-07 ENCOUNTER — Encounter: Payer: Self-pay | Admitting: Internal Medicine

## 2015-03-08 ENCOUNTER — Encounter: Payer: Self-pay | Admitting: Internal Medicine

## 2015-03-08 ENCOUNTER — Ambulatory Visit (INDEPENDENT_AMBULATORY_CARE_PROVIDER_SITE_OTHER): Payer: Medicare HMO | Admitting: Internal Medicine

## 2015-03-08 VITALS — BP 130/86 | HR 85 | Temp 97.9°F | Ht 63.0 in | Wt 115.0 lb

## 2015-03-08 DIAGNOSIS — E785 Hyperlipidemia, unspecified: Secondary | ICD-10-CM

## 2015-03-08 DIAGNOSIS — I10 Essential (primary) hypertension: Secondary | ICD-10-CM

## 2015-03-08 DIAGNOSIS — C541 Malignant neoplasm of endometrium: Secondary | ICD-10-CM

## 2015-03-08 DIAGNOSIS — M5432 Sciatica, left side: Secondary | ICD-10-CM | POA: Diagnosis not present

## 2015-03-08 DIAGNOSIS — Z0000 Encounter for general adult medical examination without abnormal findings: Secondary | ICD-10-CM

## 2015-03-08 LAB — POCT URINALYSIS DIPSTICK
Bilirubin, UA: NEGATIVE
Blood, UA: NEGATIVE
Glucose, UA: NEGATIVE
Ketones, UA: NEGATIVE
Leukocytes, UA: NEGATIVE
Nitrite, UA: NEGATIVE
Protein, UA: NEGATIVE
Spec Grav, UA: <=1.005
Urobilinogen, UA: NEGATIVE
pH, UA: 6.5

## 2015-03-08 NOTE — Patient Instructions (Addendum)
To return in one month after a trial of physical therapy for left sciatica. Defer Prevnar and flu vaccines until then. Continue statin medication. Hold antihypertensive medication. White blood cell count needs to be followed regularly. This can be done via GYN oncology. Patient to have bone density study and mammogram as well as colonoscopy soon.

## 2015-03-27 ENCOUNTER — Encounter: Payer: Self-pay | Admitting: Internal Medicine

## 2015-03-27 NOTE — Progress Notes (Addendum)
Subjective:    Patient ID: Annette Tucker, female    DOB: [birth year], 69 y.o.   MRN: [id number]  HPI 69 year old White Female last seen 02/26/2014 for health maintenance exam and evaluation of medical issues. She has a history of hypertension and hyperlipidemia which are well controlled.  Last year, she had noted  some vaginal spotting. She had a D&C that revealed endometrial cancer. Was referred to GYN oncologist. Subsequently underwent robotic-assisted TAH/BSO with lymph node dissection. Lymph nodes were negative. Stage was 1 A. She had high-grade serous endometrial carcinoma. Received radiation and chemotherapy. Chemotherapy was with Taxol and carboplatin. Continues to be followed by Dr. Marko Plume and GYN oncology. Is doing well thus far. Recent CT scan revealed possible splenic infarction and will be repeated in a few months.   She has a history of osteopenia and osteoarthritis.  Was diagnosed with mitral valve prolapse in 2001 by Dr. Aldona Bar on echocardiogram. She takes vitamin D supplement. Had Zostavax vaccine September 2011.  Tonsillectomy at age 44. TMJ mandibular advancement surgery 1986. Colonoscopy September 2006. She took Fosamax for a couple of years.  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 She is a native of [place]. Does not smoke or consume alcohol. She is single.  Family history: Mother with history of Alzheimer's dementia. Father died at age 5 with kidney disease and history of MI, hypertension, CVA. One brother with hyperlipidemia.    Review of Systems  Constitutional: Positive for fatigue.  Respiratory: Negative.   Cardiovascular: Negative.   Gastrointestinal: Negative.   Musculoskeletal:       Left-sided sciatica  Neurological: Negative.   Psychiatric/Behavioral: Negative.        Objective:   Physical Exam  Constitutional:  She is oriented to person, place, and time. She appears well-developed and well-nourished. No distress.  HENT:  Head: Normocephalic and atraumatic.  Right Ear: External ear normal.  Left Ear: External ear normal.  Mouth/Throat: No oropharyngeal exudate.  Eyes: Conjunctivae and EOM are normal. Pupils are equal, round, and reactive to light. Right eye exhibits no discharge. Left eye exhibits no discharge. No scleral icterus.  Neck: Neck supple. No JVD present. No thyromegaly present.  Cardiovascular: Normal rate, regular rhythm and normal heart sounds.   No murmur heard. Pulmonary/Chest: Effort normal and breath sounds normal. No respiratory distress. She has no wheezes. She has no rales. She exhibits no tenderness.  Breasts normal female without masses  Abdominal: Soft. Bowel sounds are normal. She exhibits no distension and no mass. There is no rebound and no guarding.  Genitourinary:  Deferred  Musculoskeletal: She exhibits no edema.  Lymphadenopathy:    She has no cervical adenopathy.  Neurological: She is alert and oriented to person, place, and time. She has normal reflexes. No cranial nerve deficit. Coordination normal.  Skin: Skin is warm and dry. No rash noted. She is not diaphoretic.  Psychiatric: She has a normal mood and affect. Her behavior is normal. Judgment and thought content normal.  Vitals reviewed.         Assessment & Plan:   Endometrial cancer stage IA status post total abdominal hysterectomy BSO robotic-assisted, status post radiation and chemotherapy. Followed by GYN oncology and Dr. Marko Plume.  Essential hypertension-stable. Patient has not been taking anti-hypertensive medication. Continue to hold and reevaluate in 4 weeks.  Hyperlipidemia-stable  Sciatica-left-sided sciatica. Refer to  physical therapy and follow-up in 4 weeks.  Plan: We'll hold antihypertensives medication and reevaluate in 4 weeks. Follow-up on back pain and sciatica in 4 weeks. White  blood cell count is slightly low at 3300 and will continue to be followed by Oncology. Other labs are entirely within normal limits.   Subjective:   Patient presents for Medicare Annual/Subsequent preventive examination.  Review Past Medical/Family/Social: See above   Risk Factors  Current exercise habits: Tries to walk some Dietary issues discussed: Low fat low carbohydrate  Cardiac risk factors: Hyperlipidemia and hypertension  Depression Screen  (Note: if answer to either of the following is "Yes", a more complete depression screening is indicated)   Over the past two weeks, have you felt down, depressed or hopeless? No  Over the past two weeks, have you felt little interest or pleasure in doing things? No Have you lost interest or pleasure in daily life? No Do you often feel hopeless? No Do you cry easily over simple problems? No   Activities of Daily Living  In your present state of health, do you have any difficulty performing the following activities?:   Driving? No  Managing money? No  Feeding yourself? No  Getting from bed to chair? No  Climbing a flight of stairs? No  Preparing food and eating?: No  Bathing or showering? No  Getting dressed: No  Getting to the toilet? No  Using the toilet:No  Moving around from place to place: No  In the past year have you fallen or had a near fall?:No  Are you sexually active? No  Do you have more than one partner? No   Hearing Difficulties: No  Do you often ask people to speak up or repeat themselves? No  Do you experience ringing or noises in your ears? No  Do you have difficulty understanding soft or whispered voices? No  Do you feel that you have a problem with memory? No Do you often misplace items? No    Home Safety:  Do you have a smoke alarm at your residence? Yes Do you have grab bars in the bathroom? No Do you have throw rugs in your house? Yes   Cognitive Testing  Alert? Yes Normal Appearance?Yes    Oriented to person? Yes Place? Yes  Time? Yes  Recall of three objects? Yes  Can perform simple calculations? Yes  Displays appropriate judgment?Yes  Can read the correct time from a watch face?Yes   List the Names of Other Physician/Practitioners you currently use:  See referral list for the physicians patient is currently seeing.  Dr. Marko Plume  GYN oncology   Review of Systems: See above   Objective:     General appearance: Appears stated age and mildly obese  Head: Normocephalic, without obvious abnormality, atraumatic  Eyes: conj clear, EOMi PEERLA  Ears: normal TM's and external ear canals both ears  Nose: Nares normal. Septum midline. Mucosa normal. No drainage or sinus tenderness.  Throat: lips, mucosa, and tongue normal; teeth and gums normal  Neck: no adenopathy, no carotid bruit, no JVD, supple, symmetrical, trachea midline and thyroid not enlarged, symmetric, no tenderness/mass/nodules  No CVA tenderness.  Lungs: clear to auscultation bilaterally  Breasts: normal appearance, no masses or tenderness, top of the pacemaker on left upper chest. Incision well-healed. It is tender.  Heart: regular rate and rhythm, S1, S2 normal, no murmur, click, rub or gallop  Abdomen: soft, non-tender; bowel sounds normal; no masses, no organomegaly  Musculoskeletal: ROM normal in  all joints, no crepitus, no deformity, Normal muscle strengthen. Back  is symmetric, no curvature. Skin: Skin color, texture, turgor normal. No rashes or lesions  Lymph nodes: Cervical, supraclavicular, and axillary nodes normal.  Neurologic: CN 2 -12 Normal, Normal symmetric reflexes. Normal coordination and gait  Psych: Alert & Oriented x 3, Mood appear stable.    Assessment:    Annual wellness medicare exam   Plan:    During the course of the visit the patient was educated and counseled about appropriate screening and preventive services including:   Annual mammogram  Annual flu  vaccine     Patient Instructions (the written plan) was given to the patient.  Medicare Attestation  I have personally reviewed:  The patient's medical and social history  Their use of alcohol, tobacco or illicit drugs  Their current medications and supplements  The patient's functional ability including ADLs,fall risks, home safety risks, cognitive, and hearing and visual impairment  Diet and physical activities  Evidence for depression or mood disorders  The patient's weight, height, BMI, and visual acuity have been recorded in the chart. I have made referrals, counseling, and provided education to the patient based on review of the above and I have provided the patient with a written personalized care plan for preventive services.

## 2015-04-04 DIAGNOSIS — M545 Low back pain: Secondary | ICD-10-CM | POA: Diagnosis not present

## 2015-04-04 DIAGNOSIS — M5416 Radiculopathy, lumbar region: Secondary | ICD-10-CM | POA: Diagnosis not present

## 2015-04-04 DIAGNOSIS — R293 Abnormal posture: Secondary | ICD-10-CM | POA: Diagnosis not present

## 2015-04-04 DIAGNOSIS — M5432 Sciatica, left side: Secondary | ICD-10-CM | POA: Diagnosis not present

## 2015-04-09 DIAGNOSIS — Z0100 Encounter for examination of eyes and vision without abnormal findings: Secondary | ICD-10-CM | POA: Diagnosis not present

## 2015-04-09 DIAGNOSIS — H5212 Myopia, left eye: Secondary | ICD-10-CM | POA: Diagnosis not present

## 2015-04-11 ENCOUNTER — Ambulatory Visit
Admission: RE | Admit: 2015-04-11 | Discharge: 2015-04-11 | Disposition: A | Discharge status: Home / Self Care | Payer: Medicare HMO | Source: Ambulatory Visit | Attending: Radiation Oncology | Admitting: Radiation Oncology

## 2015-04-11 ENCOUNTER — Other Ambulatory Visit: Payer: Medicare HMO | Admitting: Internal Medicine

## 2015-04-11 ENCOUNTER — Encounter: Payer: Self-pay | Admitting: Radiation Oncology

## 2015-04-11 ENCOUNTER — Other Ambulatory Visit (HOSPITAL_COMMUNITY)
Admission: RE | Admit: 2015-04-11 | Discharge: 2015-04-11 | Disposition: A | Discharge status: Home / Self Care | Payer: Medicare HMO | Source: Ambulatory Visit | Attending: Radiation Oncology | Admitting: Radiation Oncology

## 2015-04-11 VITALS — BP 149/90 | HR 71 | Temp 98.4°F | Resp 16 | Ht 63.0 in | Wt 117.5 lb

## 2015-04-11 DIAGNOSIS — D649 Anemia, unspecified: Secondary | ICD-10-CM

## 2015-04-11 DIAGNOSIS — C541 Malignant neoplasm of endometrium: Secondary | ICD-10-CM

## 2015-04-11 DIAGNOSIS — Z01411 Encounter for gynecological examination (general) (routine) with abnormal findings: Secondary | ICD-10-CM | POA: Insufficient documentation

## 2015-04-11 LAB — CBC WITH DIFFERENTIAL/PLATELET
Basophils Absolute: 0 10*3/uL (ref 0.0–0.1)
Basophils Relative: 1 % (ref 0–1)
Eosinophils Absolute: 0.1 10*3/uL (ref 0.0–0.7)
Eosinophils Relative: 2 % (ref 0–5)
HCT: 38.3 % (ref 36.0–46.0)
Hemoglobin: 12.8 g/dL (ref 12.0–15.0)
Lymphocytes Relative: 31 % (ref 12–46)
Lymphs Abs: 1.2 10*3/uL (ref 0.7–4.0)
MCH: 30.3 pg (ref 26.0–34.0)
MCHC: 33.4 g/dL (ref 30.0–36.0)
MCV: 90.5 fL (ref 78.0–100.0)
MPV: 10 fL (ref 8.6–12.4)
Monocytes Absolute: 0.6 10*3/uL (ref 0.1–1.0)
Monocytes Relative: 16 % — ABNORMAL HIGH (ref 3–12)
Neutro Abs: 2 10*3/uL (ref 1.7–7.7)
Neutrophils Relative %: 50 % (ref 43–77)
Platelets: 224 10*3/uL (ref 150–400)
RBC: 4.23 MIL/uL (ref 3.87–5.11)
RDW: 14.9 % (ref 11.5–15.5)
WBC: 3.9 10*3/uL — ABNORMAL LOW (ref 4.0–10.5)

## 2015-04-11 NOTE — Progress Notes (Signed)
Radiation Oncology         [phone] ________________________________  Name: Annette Tucker MRN: [id number]  Date: 04/11/2015  DOB: 1946/05/30  Follow-Up Visit Note  CC: [name], MD  Gordy Levan, MD    ICD-9-CM ICD-10-CM   1. Endometrial ca ([place]) 182.0 C54.1     Diagnosis:  FIGO stage IA high-grade endometrial serous carcinoma    Interval Since Last Radiation:  February 23, March 1, March 8, March 10, September 18, 2014 Site/dose:   Vaginal cuff 30 gray in 5 fractions  Narrative:  The patient returns today for routine follow-up. She denies pain, bowel/bladder issues, vaginal/rectal bleeding, hematuria, and fatigue. She reports having a good appetite. She is using her vaginal dilator three times a week and denies vaginal bleeding during use.  ALLERGIES:  is allergic to latex.  Meds: Current Outpatient Prescriptions  Medication Sig Dispense Refill  . acetaminophen (TYLENOL) 500 MG tablet Take 500 mg by mouth once as needed for moderate pain.     Marland Kitchen aspirin 81 MG EC tablet Take 81 mg by mouth every evening.     . Calcium Carbonate-Vitamin D (CALCIUM 600+D) 600-400 MG-UNIT per tablet Take 1 tablet by mouth 2 (two) times daily. @lunch  and in the evening    . cholecalciferol (VITAMIN D) 1000 UNITS tablet Take 1,000 Units by mouth daily.     . Coenzyme Q10 (CO Q-10) 100 MG CAPS Take 1 capsule by mouth every morning.     . fish oil-omega-3 fatty acids 1000 MG capsule Take 1 g by mouth every morning.     . Flaxseed, Linseed, (FLAX SEED OIL PO) Take 1,000 Units by mouth every evening.     . loratadine (CLARITIN) 10 MG tablet Take 10 mg by mouth daily as needed for allergies.     . Magnesium 250 MG TABS Take 1 tablet by mouth every evening.     . Multiple Vitamins-Minerals (MULTIVITAMIN WITH MINERALS) tablet Take 1 tablet by mouth every morning.     . simvastatin (ZOCOR) 20 MG tablet TAKE 1 TABLET (20 MG TOTAL) BY MOUTH AT BEDTIME. 90 tablet 0  . vitamin E 400 UNIT capsule Take  400 Units by mouth every morning.     Marland Kitchen PROCTOZONE-HC 2.5 % rectal cream Place 1 application rectally as needed.      No current facility-administered medications for this encounter.    Physical Findings: The patient is in no acute distress. Patient is alert and oriented.  height is 5\' 3"  (1.6 m) and weight is 117 lb 8 oz (53.298 kg). Her oral temperature is 98.4 F (36.9 C). Her blood pressure is 149/90 and her pulse is 71. Her respiration is 16.  Lungs are clear to auscultation bilaterally. Heart has regular rate and rhythm. No palpable cervical, supraclavicular, or axillary adenopathy. Abdomen is soft, non-tender, and non-distended. No hepatosplenomegaly.  No inguinal adenopathy. External genitalia unremarkable. Pelvic Exam: Mild radiation changes in the proximal vagina. No mucosal lesions. No pelvic masses on bimanual and rectovaginal exam. Pap smear was obtained.  Lab Findings: Lab Results  Component Value Date   WBC 3.3* 03/05/2015   HGB 14.2 03/05/2015   HCT 42.2 03/05/2015   MCV 90.6 03/05/2015   PLT 234 03/05/2015    Radiographic Findings: No results found.  Impression:  No evidence of recurrence on clinical exam today , Pap smear pending.   Plan: The pt will f/u with rad/onc in 6 months. The pt is scheduled to see Dr. Renold Genta  on 10/17 and [name] in January 2017. She will likely be scheduled for an abd/pelvic CT in December 2016.  This document serves as a record of services personally performed by Gery Pray, MD. It was created on his behalf by Darcus Austin, a trained medical scribe. The creation of this record is based on the scribe's personal observations and the provider's statements to them. This document has been checked and approved by the attending provider.  ____________________________________  Blair Promise, PhD, MD

## 2015-04-11 NOTE — Progress Notes (Signed)
Annette Tucker here for follow up.  She denies pain, bowel/bladder issues, vaginal/rectal bleeding and fatigue.  She reports having a good appetite.  She is using her vaginal dilator three times a week.  She will see [name] in January.    BP 149/90 mmHg  Pulse 71  Temp(Src) 98.4 F (36.9 C) (Oral)  Resp 16  Ht 5\' 3"  (1.6 m)  Wt 117 lb 8 oz (53.298 kg)  BMI 20.82 kg/m2

## 2015-04-12 ENCOUNTER — Telehealth: Payer: Self-pay | Admitting: *Deleted

## 2015-04-12 ENCOUNTER — Other Ambulatory Visit: Payer: Medicare HMO | Admitting: Internal Medicine

## 2015-04-12 NOTE — Telephone Encounter (Signed)
Left message with lab results on patient voice mail with instructions for follow up in 3 months

## 2015-04-15 ENCOUNTER — Encounter: Payer: Self-pay | Admitting: Internal Medicine

## 2015-04-15 ENCOUNTER — Ambulatory Visit (INDEPENDENT_AMBULATORY_CARE_PROVIDER_SITE_OTHER): Payer: Medicare HMO | Admitting: Internal Medicine

## 2015-04-15 VITALS — BP 140/90 | HR 90 | Temp 98.7°F | Ht 63.0 in | Wt 118.0 lb

## 2015-04-15 DIAGNOSIS — M8580 Other specified disorders of bone density and structure, unspecified site: Secondary | ICD-10-CM

## 2015-04-15 DIAGNOSIS — D72819 Decreased white blood cell count, unspecified: Secondary | ICD-10-CM | POA: Diagnosis not present

## 2015-04-15 DIAGNOSIS — M5432 Sciatica, left side: Secondary | ICD-10-CM

## 2015-04-15 DIAGNOSIS — J069 Acute upper respiratory infection, unspecified: Secondary | ICD-10-CM | POA: Diagnosis not present

## 2015-04-15 MED ORDER — AZITHROMYCIN 250 MG PO TABS: ORAL_TABLET | ORAL | Status: DC

## 2015-04-16 LAB — CYTOLOGY - PAP

## 2015-04-19 ENCOUNTER — Telehealth: Payer: Self-pay | Admitting: Oncology

## 2015-04-19 NOTE — Telephone Encounter (Signed)
Left a message for Irma regarding her good pap smear results per Dr. Kinard.  Requested a return call. 

## 2015-04-22 ENCOUNTER — Telehealth: Payer: Self-pay | Admitting: Oncology

## 2015-04-22 NOTE — Telephone Encounter (Signed)
Called Annette Tucker to make sure she received my message regarding her pap smear results.  Haelie said she had received the message and verbalized understanding of her results.

## 2015-04-24 ENCOUNTER — Ambulatory Visit (INDEPENDENT_AMBULATORY_CARE_PROVIDER_SITE_OTHER): Payer: Medicare HMO | Admitting: Internal Medicine

## 2015-04-24 VITALS — BP 138/88

## 2015-04-24 DIAGNOSIS — Z23 Encounter for immunization: Secondary | ICD-10-CM

## 2015-04-28 ENCOUNTER — Encounter: Payer: Self-pay | Admitting: Internal Medicine

## 2015-04-28 NOTE — Patient Instructions (Signed)
Take Zithromax Z-PAK as directed for URI. Continue to watch white blood cell count every few months. Return for Prevnar after URI has resolved.

## 2015-04-28 NOTE — Progress Notes (Signed)
   Subjective:    Patient ID: Annette Tucker, female    DOB: Jun 23, 1946, 69 y.o.   MRN: [id number]  HPI She is here today to follow-up on leukopenia. At last visit, white blood cell count was 3900 and previously had been slightly over 4000. It is now 3900. Today she has an acute URI. With regard left-sided sciatica, it has improved and she's feeling better. She has to watch treadmill activities.    Review of Systems     Objective:   Physical Exam Spent 25 minutes speaking with her about these issues. Chest is clear. Pharynx very slightly injected. Sounds nasally congested. TMs are clear. Skin warm and dry. No adenopathy. Straight leg raising is negative on the left at 90.       Assessment & Plan:  Acute URI  Leukopenia-white blood cell count improved. Continue to monitor.  Left sciatica-improved  History of uterine cancer  Plan: We plan to give her Prevnar today but will defer that due to URI. Treatment URI with Zithromax Z-PAK. Oncologist to watch white blood cell count. Patient will make oncologist aware.

## 2015-05-21 ENCOUNTER — Other Ambulatory Visit: Payer: Self-pay | Admitting: Internal Medicine

## 2015-06-25 ENCOUNTER — Ambulatory Visit (HOSPITAL_BASED_OUTPATIENT_CLINIC_OR_DEPARTMENT_OTHER): Payer: Medicare HMO

## 2015-06-26 ENCOUNTER — Telehealth: Payer: Self-pay | Admitting: Gynecologic Oncology

## 2015-06-26 ENCOUNTER — Ambulatory Visit (HOSPITAL_COMMUNITY)
Admission: RE | Admit: 2015-06-26 | Discharge: 2015-06-26 | Disposition: A | Discharge status: Home / Self Care | Payer: Medicare HMO | Source: Ambulatory Visit | Attending: Gynecologic Oncology | Admitting: Gynecologic Oncology

## 2015-06-26 DIAGNOSIS — N200 Calculus of kidney: Secondary | ICD-10-CM | POA: Diagnosis not present

## 2015-06-26 DIAGNOSIS — D735 Infarction of spleen: Secondary | ICD-10-CM

## 2015-06-26 DIAGNOSIS — C569 Malignant neoplasm of unspecified ovary: Secondary | ICD-10-CM

## 2015-06-26 LAB — POCT I-STAT CREATININE: Creatinine, Ser: 0.8 mg/dL (ref 0.44–1.00)

## 2015-06-26 MED ORDER — IOHEXOL 300 MG/ML  SOLN
100.0000 mL | Freq: Once | INTRAMUSCULAR | Status: AC | PRN
  Administered 2015-06-26: 100 mL via INTRAVENOUS

## 2015-06-26 MED ORDER — IOHEXOL 300 MG/ML  SOLN
50.0000 mL | Freq: Once | INTRAMUSCULAR | Status: DC | PRN
  Administered 2015-06-26: 50 mL via ORAL
  Filled 2015-06-26: qty 50

## 2015-06-26 NOTE — Telephone Encounter (Signed)
Message left for the patient about CT scan results.  Advised to call for any questions or concerns.

## 2015-06-30 HISTORY — PX: COLONOSCOPY: SHX174

## 2015-07-24 ENCOUNTER — Other Ambulatory Visit (HOSPITAL_BASED_OUTPATIENT_CLINIC_OR_DEPARTMENT_OTHER): Payer: Medicare HMO

## 2015-07-24 ENCOUNTER — Ambulatory Visit: Payer: Medicare HMO | Attending: Gynecologic Oncology | Admitting: Gynecologic Oncology

## 2015-07-24 ENCOUNTER — Encounter: Payer: Self-pay | Admitting: Gynecologic Oncology

## 2015-07-24 VITALS — BP 143/87 | HR 99 | Temp 98.1°F | Resp 18 | Ht 63.0 in | Wt 119.9 lb

## 2015-07-24 DIAGNOSIS — C541 Malignant neoplasm of endometrium: Secondary | ICD-10-CM

## 2015-07-24 LAB — COMPREHENSIVE METABOLIC PANEL
ALT: 15 U/L (ref 0–55)
AST: 19 U/L (ref 5–34)
Albumin: 4.1 g/dL (ref 3.5–5.0)
Alkaline Phosphatase: 83 U/L (ref 40–150)
Anion Gap: 9 mEq/L (ref 3–11)
BUN: 18 mg/dL (ref 7.0–26.0)
CO2: 28 mEq/L (ref 22–29)
Calcium: 9.7 mg/dL (ref 8.4–10.4)
Chloride: 104 mEq/L (ref 98–109)
Creatinine: 0.8 mg/dL (ref 0.6–1.1)
EGFR: 73 mL/min/{1.73_m2} — ABNORMAL LOW (ref >90)
Glucose: 110 mg/dl (ref 70–140)
Potassium: 3.8 mEq/L (ref 3.5–5.1)
Sodium: 141 mEq/L (ref 136–145)
Total Bilirubin: <0.3 mg/dL (ref 0.20–1.20)
Total Protein: 7.5 g/dL (ref 6.4–8.3)

## 2015-07-24 LAB — CBC WITH DIFFERENTIAL/PLATELET
BASO%: 0.2 % (ref 0.0–2.0)
Basophils Absolute: 0 10*3/uL (ref 0.0–0.1)
EOS%: 0.8 % (ref 0.0–7.0)
Eosinophils Absolute: 0 10*3/uL (ref 0.0–0.5)
HCT: 38.7 % (ref 34.8–46.6)
HGB: 13.3 g/dL (ref 11.6–15.9)
LYMPH%: 25.5 % (ref 14.0–49.7)
MCH: 31 pg (ref 25.1–34.0)
MCHC: 34.4 g/dL (ref 31.5–36.0)
MCV: 90.2 fL (ref 79.5–101.0)
MONO#: 0.5 10*3/uL (ref 0.1–0.9)
MONO%: 9.6 % (ref 0.0–14.0)
NEUT#: 3.3 10*3/uL (ref 1.5–6.5)
NEUT%: 63.9 % (ref 38.4–76.8)
Platelets: 195 10*3/uL (ref 145–400)
RBC: 4.29 10*6/uL (ref 3.70–5.45)
RDW: 13.1 % (ref 11.2–14.5)
WBC: 5.1 10*3/uL (ref 3.9–10.3)
lymph#: 1.3 10*3/uL (ref 0.9–3.3)

## 2015-07-24 NOTE — Patient Instructions (Addendum)
Follow-up with Dr. Sondra Come in 3 months and return to see [name] in 6 months.  We will contact you with the results of your CBC from today.  Please call in April or May to schedule your appt for July 2017.

## 2015-07-24 NOTE — Progress Notes (Signed)
Consult Note: Gyn-Onc  Annette Tucker 70 y.o. female  CC:  Chief Complaint  Patient presents with  . endometrial cancer    MD follow up visit    HPI: Patient was initially seen in consultation at the request of Dr. Pamala Hurry.  Patient is a 70 year old gravida 0 who began experiencing some low back pain and then spotting starting in August.. She had an exam which showed significant atrophy and hormone cream was prescribed. She then continued to have some spotting and discharge went to see Dr. Pamala Hurry. Ultrasound was performed that revealed a 4 x 3 x 2 centimeter uterus with a 4.8 milliliter endometrial stripe. There were 2 small lesions measuring 0.5 cm each consistent with polyps. She had been on hormone replacement therapy until 2012. She had been on it approximately 10 years. Pathology from her D&C revealed high-grade endometrial cancer with serous features. For this reason that she is referred to [place] today.  She's due for colonoscopy in 2016. Her primary physician is Dr. Tedra Senegal.  06/05/14: Surgery: Total robotic hysterectomy bilateral salpingo-oophorectomy, bilateral pelvic and para-aortic lymph node dissection.   Operative findings: Normal uterus, cervix, adnexal and abdominal survey, No pathologically enlarged nodes identified.   Pathology:  Diagnosis 1. Lymph node, biopsy, right para aortic nodes - ONE LYMPH NODE, NEGATIVE FOR METASTATIC CARCINOMA (0/1). 2. Lymph node, biopsy, left para aortic node - THREE LYMPH NODES, NEGATIVE FOR METASTATIC CARCINOMA (0/3). 3. Lymph nodes, regional resection, right pelvic - FOUR LYMPH NODES, NEGATIVE FOR METASTATIC CARCINOMA (0/4). 4. Lymph nodes, regional resection, left pelvic - FOUR LYMPH NODES, NEGATIVE FOR METASTATIC CARCINOMA (0/4). 5. Uterus +/- tubes/ovaries, neoplastic, with cervix - SUPERFICIALLY INVASIVE SEROUS CARCINOMA, 0.5 CM, CONFINED WITHIN THE INNER HALF OF THE MYOMETRIUM. (20%, no LVIS) - ADJACENT BENIGN ENDOMETRIAL  POLYPS WITH NO EVIDENCE OF ATYPIA OR MALIGNANCY. - CERVIX: BENIGN SQUAMOUS MUCOSA AND ENDOCERVICAL MUCOSA, NO DYSPLASIA OR MALIGNANCY. - BILATERAL OVARIES: BENIGN OVARIAN TISSUE WITH ENDOMETRIOSIS AND ENDOSALPINGIOSIS, NO ATYPIA OR MALIGNANCY. - BILATERAL FALLOPIAN TUBES: BENIGN FALLOPIAN TUBAL TISSUE, NO EVIDENCE OF ATYPIA OR MALIGNANCY. - MYOMETRIUM: LEIOMYOMATA.  We saw her for her postoperative check in the latter part of January. Since that time she's completed 6 cycles of dose dense paclitaxel and carboplatin. She completed day #8 of cycle #6 on Nov 01, 2014. In addition, she's completed her high-dose vaginal cuff brachytherapy with Dr. Sondra Come.  She had a posttreatment CT scan that revealed (12/27/14): FINDINGS: Lower chest: Lung bases are clear.  Hepatobiliary: No focal hepatic lesion. No biliary duct dilatation.Gallbladder is normal. Common bile duct is normal.  Pancreas: Pancreas is normal. No ductal dilatation. No pancreatic inflammation.  Spleen: Linear hypodense band within the mid spleen extending from the hilum to the capsule (image 12, series 2) most suggestive of a splenic infarction. Splenic laceration could have a similar pattern. No evidence of acute injury.  Adrenals/urinary tract: Adrenal glands are normal. There is a nonenhancing cyst of the left kidney. Ureters are normal. The bladder is normal.  Stomach/Bowel: Stomach, small-bowel, and cecum normal. The colon and rectosigmoid colon normal. There is a large stool ball in the rectum measuring 7 cm in diameter.  Vascular/Lymphatic: Abdominal aorta is normal caliber. There is minimal of vascular intimal calcification. Splenic artery appears normal.  There is no retroperitoneal lymphadenopathy. No periportal adenopathy. No mesenteric adenopathy. No pelvic lymphadenopathy.  Reproductive: Post hysterectomy anatomy. No adnexal abnormality.  Musculoskeletal: No aggressive osseous lesion.  Other: No free  fluid.  IMPRESSION: 1. No evidence  of endometrial carcinoma recurrence within the abdomen or pelvis. 2. New linear band within the spleen is most suggestive of a splenic infarction. 3. Probable benign left renal cyst. 4. Large stool ball in the rectum.  Interval History: She saw Dr. Renold Genta in October and was seen by Dr. Sondra Come in October at which time she had a Pap smear that was unremarkable. She had a follow-up CT scan 06/26/2015. It showed no evidence of metastatic disease. She had resolution of the splenic infarct.  She has been seeing Dr. Renold Genta for leukopenia and she wants [place] to repeat a CBC today. She did have a sinus infection and was placed on a Z-Pak which helped significantly. She states she's feeling better now and is breathing better than she had in 9 months. She is walking on her treadmill. She's had some pain in her lower back and hip. She had a colonoscopy and a recommended follow-up in 10 years. She is up-to-date on her mammograms and her bone densities. She continues to use her vaginal dilator.  She has gained approximately 6 pounds since we last saw her. She is continuing to drink Carnation instant breakfast in the morning which was one of the few things that she could drink when she was undergoing her treatment. We talked about maybe substituting" or yogurt rather than the [employer]. She states that that is her plan when she runs out.  Review of Systems  Constitutional: Denies fever. Skin: No rash Cardiovascular: No chest pain, shortness of breath, or edema  Pulmonary: No cough   Gastro Intestinal: No nausea, vomiting, constipation, or diarrhea reported. No bright red blood per rectum or change in bowel movement.  Genitourinary: Denies vaginal bleeding and discharge.  Musculoskeletal:  +LBP Neurologic: Slight numbness of left foot  Psychology: No changes   Current Meds:  Outpatient Encounter Prescriptions as of 07/24/2015  Medication Sig  .  acetaminophen (TYLENOL) 500 MG tablet Take 500 mg by mouth once as needed for moderate pain.   Marland Kitchen aspirin 81 MG EC tablet Take 81 mg by mouth every evening.   . Calcium Carbonate-Vitamin D (CALCIUM 600+D) 600-400 MG-UNIT per tablet Take 1 tablet by mouth 2 (two) times daily. @lunch  and in the evening  . cholecalciferol (VITAMIN D) 1000 UNITS tablet Take 1,000 Units by mouth daily.   . Coenzyme Q10 (CO Q-10) 100 MG CAPS Take 1 capsule by mouth every morning.   . fish oil-omega-3 fatty acids 1000 MG capsule Take 1 g by mouth every morning.   . loratadine (CLARITIN) 10 MG tablet Take 10 mg by mouth daily as needed for allergies.   . Magnesium 250 MG TABS Take 1 tablet by mouth every evening.   . Multiple Vitamins-Minerals (MULTIVITAMIN WITH MINERALS) tablet Take 1 tablet by mouth every morning.   . simvastatin (ZOCOR) 20 MG tablet TAKE 1 TABLET (20 MG TOTAL) BY MOUTH AT BEDTIME.  . vitamin E 400 UNIT capsule Take 400 Units by mouth every morning.   . Flaxseed, Linseed, (FLAX SEED OIL PO) Take 1,000 Units by mouth every evening. Reported on 07/24/2015  . PROCTOZONE-HC 2.5 % rectal cream Place 1 application rectally as needed. Reported on 07/24/2015  . [DISCONTINUED] azithromycin (ZITHROMAX) 250 MG tablet 2 po day 1 then one po days 2-5  . [DISCONTINUED] simvastatin (ZOCOR) 20 MG tablet TAKE 1 TABLET (20 MG TOTAL) BY MOUTH AT BEDTIME.   No facility-administered encounter medications on file as of 07/24/2015.    Allergy:  Allergies  Allergen  Reactions  . Latex Rash    Social Hx:   Social History   Social History  . Marital Status: Single    Spouse Name: N/A  . Number of Children: 0  . Years of Education: N/A   Occupational History  . retired Scientist, research (medical)     Social History Main Topics  . Smoking status: Never Smoker   . Smokeless tobacco: Never Used  . Alcohol Use: Yes     Comment: rarely  . Drug Use: No  . Sexual Activity: No   Other Topics Concern  . Not on file   Social History  Narrative    Past Surgical Hx:  Past Surgical History  Procedure Laterality Date  . Tonsillectomy      age 65  . Tmj mandibular advancement    . Wisdom tooth extraction    . Breast surgery      left breast bx - benign  . Colonoscopy  2006  . Dilatation & curettage/hysteroscopy with trueclear N/A 05/17/2014    Procedure: HYSTEROSCOPY WITH POLYPECTOMY;  Surgeon: Claiborne Billings A. Pamala Hurry, MD;  Location: [facility];  Service: Gynecology;  Laterality: N/A;  . Robotic assisted total hysterectomy with bilateral salpingo oopherectomy N/A 06/05/2014    Procedure: ROBOTIC ASSISTED TOTAL HYSTERECTOMY WITH BILATERAL SALPINGO OOPHORECTOMY;  Surgeon: Imagene Gurney A. Alycia Rossetti, MD;  Location: [facility];  Service: Gynecology;  Laterality: N/A;  . Lymph node dissection N/A 06/05/2014    Procedure: LYMPH NODE DISSECTION;  Surgeon: Imagene Gurney A. Alycia Rossetti, MD;  Location: [facility];  Service: Gynecology;  Laterality: N/A;    Past Medical Hx:  Past Medical History  Diagnosis Date  . Hyperlipidemia   . Hypertension   . Heart murmur     as child, no problems   . Seasonal allergies   . Radiation 08/21/14, 08/28/14, 09/04/14, 09/06/14, 09/18/14    vaginal cuff brachytherapy 30 gray  . Cancer [facility])     ENDOMETRIAL CANCER    Oncology Hx:    Endometrial ca [facility])   05/17/2014 Initial Diagnosis Endometrial ca   06/05/2014 Surgery TRH/BSO and staging. IAUPSC 0/12 nodes, no LVSI   07/12/2014 -  Chemotherapy paclitaxel and carboplatin   08/21/2014 - 09/18/2014 Radiation Therapy     Uterine cancer ([place])   05/17/2014 Initial Diagnosis Uterine cancer    - 11/01/2014 Chemotherapy Completed 6 cycles of paclitaxel and carboplatin based chemotherapy   06/05/2014 Surgery IA UPSC    Radiation Therapy     Family Hx:  Family History  Problem Relation Age of Onset  . Mental retardation Mother   . Stroke Mother   . Kidney disease Mother     Vitals:  Blood pressure 143/87, pulse 99, temperature 98.1 F (36.7 C), temperature source Oral, resp. rate 18,  height 5\' 3"  (1.6 m), weight 119 lb 14.4 oz (54.386 kg), SpO2 99 %.  Physical Exam: Well-nourished well-developed female in no acute distress.  Neck: Supple, no lymphadenopathy, no thyromegaly.  Lungs: Clear to auscultation bilateral.  Cardiac: Regular rate and rhythm  Abdomen: Soft, nontender, nondistended. There are no palpable masses or hepatosplenomegaly. Well-healed surgical incisions with no evidence of any incisional hernias.  Extremities: No edema.  Pelvic: Normal female genitalia. The vagina is markedly atrophic. The vaginal cuff is without lesions. Bimanual examination reveals no tenderness, fluctuance or masses. 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 Her preoperative staging was negative. Her surgical staging was similarly negative. She's been treated adjuvantly with dose dense  paclitaxel and carboplatin. She's completed all of her therapy with her last cycle of chemotherapy being Nov 01, 2014. She's had a negative post-treatment CT scan. We reviewed signs and symptoms of recurrent endometrial cancer.   She will follow-up with Dr. Sondra Come in 3 months and return to see [place] in 6 months.  She was provided a copy of her CT scan. Bennette Hasty A., MD 07/24/2015, 1:45 PM

## 2015-07-25 ENCOUNTER — Telehealth: Payer: Self-pay | Admitting: Gynecologic Oncology

## 2015-07-25 NOTE — Telephone Encounter (Signed)
Patient informed of CBC results.  Advised the results would be faxed to Dr. Renold Genta.  No concerns voiced.  Advised to call for any needs or concerns.

## 2015-09-09 DIAGNOSIS — Z23 Encounter for immunization: Secondary | ICD-10-CM | POA: Diagnosis not present

## 2015-09-09 DIAGNOSIS — L570 Actinic keratosis: Secondary | ICD-10-CM | POA: Diagnosis not present

## 2015-09-09 DIAGNOSIS — L821 Other seborrheic keratosis: Secondary | ICD-10-CM | POA: Diagnosis not present

## 2015-09-09 DIAGNOSIS — D225 Melanocytic nevi of trunk: Secondary | ICD-10-CM | POA: Diagnosis not present

## 2015-10-03 ENCOUNTER — Ambulatory Visit
Admission: RE | Admit: 2015-10-03 | Discharge: 2015-10-03 | Disposition: A | Discharge status: Home / Self Care | Payer: Medicare HMO | Source: Ambulatory Visit | Attending: Radiation Oncology | Admitting: Radiation Oncology

## 2015-10-03 ENCOUNTER — Encounter: Payer: Self-pay | Admitting: Radiation Oncology

## 2015-10-03 VITALS — BP 130/65 | HR 76 | Temp 98.6°F | Ht 63.0 in | Wt 120.1 lb

## 2015-10-03 DIAGNOSIS — C541 Malignant neoplasm of endometrium: Secondary | ICD-10-CM

## 2015-10-03 NOTE — Progress Notes (Signed)
Alric Quan here for follow up.  She denies having pain, bladder issues, bowel issues, vaginal/rectal bleeding and nausea.  She reports having slight fatigue.  BP 130/65 mmHg  Pulse 76  Temp(Src) 98.6 F (37 C) (Oral)  Ht 5\' 3"  (1.6 m)  Wt 120 lb 1.6 oz (54.477 kg)  BMI 21.28 kg/m2   Wt Readings from Last 3 Encounters:  10/03/15 120 lb 1.6 oz (54.477 kg)  07/24/15 119 lb 14.4 oz (54.386 kg)  04/15/15 118 lb (53.524 kg)

## 2015-10-03 NOTE — Progress Notes (Signed)
Radiation Oncology         ([phone]) [phone] ________________________________  Name: Annette Tucker MRN: [id number]  Date: 10/03/2015  DOB: 21-Jan-1946  Follow-Up Visit Note  CC: [name], MD  Gordy Levan, MD    ICD-9-CM ICD-10-CM   1. Endometrial ca ([place]) 182.0 C54.1     Diagnosis:  FIGO stage IA high-grade endometrial serous carcinoma    Interval Since Last Radiation:  February 23, March 1, March 8, March 10, September 18, 2014, (1 year)  Site/dose:   Vaginal cuff 30 gray in 5 fractions  Narrative: Annette Tucker here for follow up. She denies having pain, bladder issues, bowel issues, vaginal/rectal bleeding, nausea, cramping, hematuria. She reports having slight fatigue. Still using vaginal dilator ~3x/week.   ALLERGIES:  is allergic to latex.  Meds: Current Outpatient Prescriptions  Medication Sig Dispense Refill  . acetaminophen (TYLENOL) 500 MG tablet Take 500 mg by mouth once as needed for moderate pain.     Marland Kitchen aspirin 81 MG EC tablet Take 81 mg by mouth every evening.     . Calcium Carbonate-Vitamin D (CALCIUM 600+D) 600-400 MG-UNIT per tablet Take 1 tablet by mouth 2 (two) times daily. @lunch  and in the evening    . cholecalciferol (VITAMIN D) 1000 UNITS tablet Take 1,000 Units by mouth daily.     . Coenzyme Q10 (CO Q-10) 100 MG CAPS Take 1 capsule by mouth every morning.     . fish oil-omega-3 fatty acids 1000 MG capsule Take 1 g by mouth every morning.     . Magnesium 250 MG TABS Take 1 tablet by mouth every evening.     . Multiple Vitamins-Minerals (MULTIVITAMIN WITH MINERALS) tablet Take 1 tablet by mouth every morning.     . simvastatin (ZOCOR) 20 MG tablet TAKE 1 TABLET (20 MG TOTAL) BY MOUTH AT BEDTIME. 90 tablet 3  . vitamin E 400 UNIT capsule Take 400 Units by mouth every morning.     . Flaxseed, Linseed, (FLAX SEED OIL PO) Take 1,000 Units by mouth every evening. Reported on 10/03/2015    . loratadine (CLARITIN) 10 MG tablet Take 10 mg by mouth daily as needed  for allergies. Reported on 10/03/2015    . PROCTOZONE-HC 2.5 % rectal cream Place 1 application rectally as needed. Reported on 10/03/2015     No current facility-administered medications for this encounter.    Physical Findings: The patient is in no acute distress. Patient is alert and oriented.  height is 5\' 3"  (1.6 m) and weight is 120 lb 1.6 oz (54.477 kg). Her oral temperature is 98.6 F (37 C). Her blood pressure is 130/65 and her pulse is 76.  Lungs are clear to auscultation bilaterally. Heart has regular rate and rhythm. No palpable cervical, supraclavicular, or axillary adenopathy. Abdomen is soft, non-tender, and non-distended. No hepatosplenomegaly.  No inguinal adenopathy. External genitalia unremarkable.     Pelvic Exam: Mild radiation changes in the proximal vagina. No mucosal lesions. No pelvic masses on bimanual and rectovaginal exam. Rectal Sphincter tone normal.   Lab Findings: Lab Results  Component Value Date   WBC 5.1 07/24/2015   HGB 13.3 07/24/2015   HCT 38.7 07/24/2015   MCV 90.2 07/24/2015   PLT 195 07/24/2015    Radiographic Findings: CT scan of the abdomen and pelvis in December 2016 showing no evidence of recurrence  Impression:  No evidence of recurrence on clinical exam today.   Plan: Follow up in October. She will see Dr Alycia Rossetti  in July of this year.  ____________________________________  Blair Promise, PhD, MD  This document serves as a record of services personally performed by Gery Pray, MD. It was created on his behalf by Derek Mound, a trained medical scribe. The creation of this record is based on the scribe's personal observations and the provider's statements to them. This document has been checked and approved by the attending provider.

## 2015-10-10 ENCOUNTER — Ambulatory Visit: Payer: Self-pay | Admitting: Radiation Oncology

## 2015-12-19 DIAGNOSIS — L570 Actinic keratosis: Secondary | ICD-10-CM | POA: Diagnosis not present

## 2015-12-19 DIAGNOSIS — L820 Inflamed seborrheic keratosis: Secondary | ICD-10-CM | POA: Diagnosis not present

## 2015-12-19 DIAGNOSIS — I781 Nevus, non-neoplastic: Secondary | ICD-10-CM | POA: Diagnosis not present

## 2015-12-19 DIAGNOSIS — L821 Other seborrheic keratosis: Secondary | ICD-10-CM | POA: Diagnosis not present

## 2015-12-19 DIAGNOSIS — D485 Neoplasm of uncertain behavior of skin: Secondary | ICD-10-CM | POA: Diagnosis not present

## 2015-12-25 ENCOUNTER — Encounter: Payer: Self-pay | Admitting: Gynecologic Oncology

## 2015-12-25 ENCOUNTER — Other Ambulatory Visit: Payer: Self-pay | Admitting: Gynecologic Oncology

## 2015-12-25 ENCOUNTER — Ambulatory Visit: Payer: Medicare HMO | Attending: Gynecologic Oncology | Admitting: Gynecologic Oncology

## 2015-12-25 ENCOUNTER — Other Ambulatory Visit (HOSPITAL_COMMUNITY)
Admission: RE | Admit: 2015-12-25 | Discharge: 2015-12-25 | Disposition: A | Discharge status: Home / Self Care | Payer: Medicare HMO | Source: Ambulatory Visit | Attending: Gynecologic Oncology | Admitting: Gynecologic Oncology

## 2015-12-25 VITALS — BP 159/99 | HR 86 | Temp 98.0°F | Resp 18 | Ht 63.0 in | Wt 119.6 lb

## 2015-12-25 DIAGNOSIS — Z9221 Personal history of antineoplastic chemotherapy: Secondary | ICD-10-CM | POA: Insufficient documentation

## 2015-12-25 DIAGNOSIS — Z7982 Long term (current) use of aspirin: Secondary | ICD-10-CM | POA: Insufficient documentation

## 2015-12-25 DIAGNOSIS — I10 Essential (primary) hypertension: Secondary | ICD-10-CM | POA: Diagnosis not present

## 2015-12-25 DIAGNOSIS — C541 Malignant neoplasm of endometrium: Secondary | ICD-10-CM | POA: Insufficient documentation

## 2015-12-25 DIAGNOSIS — Z923 Personal history of irradiation: Secondary | ICD-10-CM | POA: Diagnosis not present

## 2015-12-25 DIAGNOSIS — Z90722 Acquired absence of ovaries, bilateral: Secondary | ICD-10-CM | POA: Insufficient documentation

## 2015-12-25 DIAGNOSIS — Z841 Family history of disorders of kidney and ureter: Secondary | ICD-10-CM | POA: Insufficient documentation

## 2015-12-25 DIAGNOSIS — Z01411 Encounter for gynecological examination (general) (routine) with abnormal findings: Secondary | ICD-10-CM | POA: Diagnosis not present

## 2015-12-25 DIAGNOSIS — Z823 Family history of stroke: Secondary | ICD-10-CM | POA: Diagnosis not present

## 2015-12-25 DIAGNOSIS — Z810 Family history of intellectual disabilities: Secondary | ICD-10-CM | POA: Diagnosis not present

## 2015-12-25 DIAGNOSIS — E785 Hyperlipidemia, unspecified: Secondary | ICD-10-CM | POA: Diagnosis not present

## 2015-12-25 DIAGNOSIS — Z90710 Acquired absence of both cervix and uterus: Secondary | ICD-10-CM | POA: Diagnosis not present

## 2015-12-25 DIAGNOSIS — Z01419 Encounter for gynecological examination (general) (routine) without abnormal findings: Secondary | ICD-10-CM | POA: Diagnosis not present

## 2015-12-25 NOTE — Progress Notes (Signed)
Consult Note: Gyn-Onc  Annette Tucker 70 y.o. female  CC:  Chief Complaint  Patient presents with  . Follow-up    HPI: Patient was initially seen in consultation at the request of Dr. Pamala Hurry.  Patient is a 70 year old gravida 0 who began experiencing some low back pain and then spotting starting in August.. She had an exam which showed significant atrophy and hormone cream was prescribed. She then continued to have some spotting and discharge went to see Dr. Pamala Hurry. Ultrasound was performed that revealed a 4 x 3 x 2 centimeter uterus with a 4.8 milliliter endometrial stripe. There were 2 small lesions measuring 0.5 cm each consistent with polyps. She had been on hormone replacement therapy until 2012. She had been on it approximately 10 years. Pathology from her D&C revealed high-grade endometrial cancer with serous features. For this reason that she is referred to [place] today.  She's due for colonoscopy in 2016. Her primary physician is Dr. Tedra Senegal.  06/05/14: Surgery: Total robotic hysterectomy bilateral salpingo-oophorectomy, bilateral pelvic and para-aortic lymph node dissection.   Operative findings: Normal uterus, cervix, adnexal and abdominal survey, No pathologically enlarged nodes identified.   Pathology:  Diagnosis 1. Lymph node, biopsy, right para aortic nodes - ONE LYMPH NODE, NEGATIVE FOR METASTATIC CARCINOMA (0/1). 2. Lymph node, biopsy, left para aortic node - THREE LYMPH NODES, NEGATIVE FOR METASTATIC CARCINOMA (0/3). 3. Lymph nodes, regional resection, right pelvic - FOUR LYMPH NODES, NEGATIVE FOR METASTATIC CARCINOMA (0/4). 4. Lymph nodes, regional resection, left pelvic - FOUR LYMPH NODES, NEGATIVE FOR METASTATIC CARCINOMA (0/4). 5. Uterus +/- tubes/ovaries, neoplastic, with cervix - SUPERFICIALLY INVASIVE SEROUS CARCINOMA, 0.5 CM, CONFINED WITHIN THE INNER HALF OF THE MYOMETRIUM. (20%, no LVIS) - ADJACENT BENIGN ENDOMETRIAL POLYPS WITH NO EVIDENCE OF ATYPIA OR  MALIGNANCY. - CERVIX: BENIGN SQUAMOUS MUCOSA AND ENDOCERVICAL MUCOSA, NO DYSPLASIA OR MALIGNANCY. - BILATERAL OVARIES: BENIGN OVARIAN TISSUE WITH ENDOMETRIOSIS AND ENDOSALPINGIOSIS, NO ATYPIA OR MALIGNANCY. - BILATERAL FALLOPIAN TUBES: BENIGN FALLOPIAN TUBAL TISSUE, NO EVIDENCE OF ATYPIA OR MALIGNANCY. - MYOMETRIUM: LEIOMYOMATA.  We saw her for her postoperative check in the latter part of January 2016. Since that time she's completed 6 cycles of dose dense paclitaxel and carboplatin. She completed day #8 of cycle #6 on Nov 01, 2014. In addition, she's completed her high-dose vaginal cuff brachytherapy with Dr. Sondra Come.  She had a posttreatment CT scan that revealed (12/27/14): FINDINGS: Lower chest: Lung bases are clear.  Hepatobiliary: No focal hepatic lesion. No biliary duct dilatation.Gallbladder is normal. Common bile duct is normal.  Pancreas: Pancreas is normal. No ductal dilatation. No pancreatic inflammation.  Spleen: Linear hypodense band within the mid spleen extending from the hilum to the capsule (image 12, series 2) most suggestive of a splenic infarction. Splenic laceration could have a similar pattern. No evidence of acute injury.  Adrenals/urinary tract: Adrenal glands are normal. There is a nonenhancing cyst of the left kidney. Ureters are normal. The bladder is normal.  Stomach/Bowel: Stomach, small-bowel, and cecum normal. The colon and rectosigmoid colon normal. There is a large stool ball in the rectum measuring 7 cm in diameter.  Vascular/Lymphatic: Abdominal aorta is normal caliber. There is minimal of vascular intimal calcification. Splenic artery appears normal.  There is no retroperitoneal lymphadenopathy. No periportal adenopathy. No mesenteric adenopathy. No pelvic lymphadenopathy.  Reproductive: Post hysterectomy anatomy. No adnexal abnormality.  Musculoskeletal: No aggressive osseous lesion.  Other: No free fluid.  IMPRESSION: 1. No evidence  of endometrial carcinoma recurrence within the abdomen  or pelvis. 2. New linear band within the spleen is most suggestive of a splenic infarction. 3. Probable benign left renal cyst. 4. Large stool ball in the rectum.   She saw Dr. Renold Genta in October and was seen by Dr. Sondra Come in April 2017 at which time she had a Pap smear that was unremarkable in 10/16. She had a follow-up CT scan 06/26/2015. It showed no evidence of metastatic disease. She had resolution of the splenic infarct.  She had a colonoscopy and a recommended follow-up in 10 years. She is up-to-date on her mammograms and her bone densities.  Interval History: She is having some issues with some lower back pain she was seen by physical therapy and I give her some exercises to do which helped but she has not been as compliant with these that she should be beer. She's been very compliant with her vaginal dilators. She feels that she is about 100% other than her back issues. She will be seeing Dr. Renold Genta in the near future for her routine annual exam. There are no new medical problems and her family.  Review of Systems  Constitutional: Denies fever. Skin: No rash Cardiovascular: No chest pain, shortness of breath, or edema  Pulmonary: No cough   Gastro Intestinal: No nausea, vomiting, constipation, or diarrhea reported. No bright red blood per rectum or change in bowel movement.  Genitourinary: Denies vaginal bleeding and discharge.  Musculoskeletal:  +LBP Neurologic: No neuropathy Psychology: No changes   Current Meds:  Outpatient Encounter Prescriptions as of 12/25/2015  Medication Sig  . acetaminophen (TYLENOL) 500 MG tablet Take 500 mg by mouth once as needed for moderate pain.   Marland Kitchen aspirin 81 MG EC tablet Take 81 mg by mouth every evening.   . Calcium Carbonate-Vitamin D (CALCIUM 600+D) 600-400 MG-UNIT per tablet Take 1 tablet by mouth 2 (two) times daily. @lunch  and in the evening  . cholecalciferol (VITAMIN D) 1000 UNITS  tablet Take 1,000 Units by mouth daily.   . Coenzyme Q10 (CO Q-10) 100 MG CAPS Take 1 capsule by mouth every morning.   . fish oil-omega-3 fatty acids 1000 MG capsule Take 1 g by mouth every morning.   . Flaxseed, Linseed, (FLAX SEED OIL PO) Take 1,000 Units by mouth every evening. Reported on 10/03/2015  . loratadine (CLARITIN) 10 MG tablet Take 10 mg by mouth daily as needed for allergies. Reported on 10/03/2015  . Magnesium 250 MG TABS Take 1 tablet by mouth every evening.   . Multiple Vitamins-Minerals (MULTIVITAMIN WITH MINERALS) tablet Take 1 tablet by mouth every morning.   Marland Kitchen PROCTOZONE-HC 2.5 % rectal cream Place 1 application rectally as needed. Reported on 10/03/2015  . simvastatin (ZOCOR) 20 MG tablet TAKE 1 TABLET (20 MG TOTAL) BY MOUTH AT BEDTIME.  . vitamin E 400 UNIT capsule Take 400 Units by mouth every morning.    No facility-administered encounter medications on file as of 12/25/2015.    Allergy:  Allergies  Allergen Reactions  . Latex Rash    Social Hx:   Social History   Social History  . Marital Status: Single    Spouse Name: N/A  . Number of Children: 0  . Years of Education: N/A   Occupational History  . retired Scientist, research (medical)     Social History Main Topics  . Smoking status: Never Smoker   . Smokeless tobacco: Never Used  . Alcohol Use: Yes     Comment: rarely  . Drug Use: No  . Sexual Activity:  No   Other Topics Concern  . Not on file   Social History Narrative    Past Surgical Hx:  Past Surgical History  Procedure Laterality Date  . Tonsillectomy      age 83  . Tmj mandibular advancement    . Wisdom tooth extraction    . Breast surgery      left breast bx - benign  . Colonoscopy  2006  . Dilatation & curettage/hysteroscopy with trueclear N/A 05/17/2014    Procedure: HYSTEROSCOPY WITH POLYPECTOMY;  Surgeon: Claiborne Billings A. Pamala Hurry, MD;  Location: [facility];  Service: Gynecology;  Laterality: N/A;  . Robotic assisted total hysterectomy with bilateral salpingo  oopherectomy N/A 06/05/2014    Procedure: ROBOTIC ASSISTED TOTAL HYSTERECTOMY WITH BILATERAL SALPINGO OOPHORECTOMY;  Surgeon: Imagene Gurney A. Alycia Rossetti, MD;  Location: [facility];  Service: Gynecology;  Laterality: N/A;  . Lymph node dissection N/A 06/05/2014    Procedure: LYMPH NODE DISSECTION;  Surgeon: Imagene Gurney A. Alycia Rossetti, MD;  Location: [facility];  Service: Gynecology;  Laterality: N/A;    Past Medical Hx:  Past Medical History  Diagnosis Date  . Hyperlipidemia   . Hypertension   . Heart murmur     as child, no problems   . Seasonal allergies   . Radiation 08/21/14, 08/28/14, 09/04/14, 09/06/14, 09/18/14    vaginal cuff brachytherapy 30 gray  . Cancer [facility])     ENDOMETRIAL CANCER    Oncology Hx:    Endometrial ca [facility])   05/17/2014 Initial Diagnosis Endometrial ca   06/05/2014 Surgery TRH/BSO and staging. IAUPSC 0/12 nodes, no LVSI   07/12/2014 -  Chemotherapy paclitaxel and carboplatin   08/21/2014 - 09/18/2014 Radiation Therapy     Uterine cancer ([place])   05/17/2014 Initial Diagnosis Uterine cancer    - 11/01/2014 Chemotherapy Completed 6 cycles of paclitaxel and carboplatin based chemotherapy   06/05/2014 Surgery IA UPSC    Radiation Therapy     Family Hx:  Family History  Problem Relation Age of Onset  . Mental retardation Mother   . Stroke Mother   . Kidney disease Mother     Vitals:  There were no vitals taken for this visit.  Physical Exam: Well-nourished well-developed female in no acute distress.  Neck: Supple, no lymphadenopathy, no thyromegaly.  Lungs: Clear to auscultation bilateral.  Cardiac: Regular rate and rhythm  Abdomen: Soft, nontender, nondistended. There are no palpable masses or hepatosplenomegaly. Well-healed surgical incisions with no evidence of any incisional hernias.  Extremities: No edema.  Groins: No lymphadenopathy.  Pelvic: Normal female genitalia. The vagina is markedly atrophic. The vaginal cuff is without lesions. Pap smear submitted without difficulty.  Bimanual examination reveals no tenderness, fluctuance or masses. 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 Her preoperative staging was negative. Her surgical staging was similarly negative. She's been treated adjuvantly with dose dense paclitaxel and carboplatin. She's completed all of her therapy with her last cycle of chemotherapy being Nov 01, 2014. She's had a negative post-treatment CT scan. We reviewed signs and symptoms of recurrent endometrial cancer. We will notify her of the results for Pap smear from today.  She will follow-up with Dr. Sondra Come in 3 months and return to see [place] in 6 months.   Rosell Khouri A., MD 12/25/2015, 10:38 AM

## 2015-12-25 NOTE — Patient Instructions (Addendum)
We will notify you of your results from today. Please return to radiation oncology in 3 months into GYN oncology in 6 months. Have a great summer!  Please call us after you see Dr. Sondra Come to schedule your appt for Jan with [name].

## 2015-12-27 ENCOUNTER — Telehealth: Payer: Self-pay | Admitting: Gynecologic Oncology

## 2015-12-27 LAB — CYTOLOGY - PAP

## 2015-12-27 NOTE — Telephone Encounter (Signed)
Pt informed of pap smear results: negative.  Instructed to call for any questions or concerns.

## 2016-01-08 ENCOUNTER — Ambulatory Visit: Payer: Self-pay | Admitting: Gynecologic Oncology

## 2016-01-16 DIAGNOSIS — R69 Illness, unspecified: Secondary | ICD-10-CM | POA: Diagnosis not present

## 2016-03-06 ENCOUNTER — Other Ambulatory Visit: Payer: Medicare HMO | Admitting: Internal Medicine

## 2016-03-06 DIAGNOSIS — C541 Malignant neoplasm of endometrium: Secondary | ICD-10-CM

## 2016-03-06 DIAGNOSIS — I341 Nonrheumatic mitral (valve) prolapse: Secondary | ICD-10-CM

## 2016-03-06 DIAGNOSIS — E559 Vitamin D deficiency, unspecified: Secondary | ICD-10-CM | POA: Diagnosis not present

## 2016-03-06 DIAGNOSIS — Z0000 Encounter for general adult medical examination without abnormal findings: Secondary | ICD-10-CM | POA: Diagnosis not present

## 2016-03-06 DIAGNOSIS — M8580 Other specified disorders of bone density and structure, unspecified site: Secondary | ICD-10-CM

## 2016-03-06 DIAGNOSIS — I10 Essential (primary) hypertension: Secondary | ICD-10-CM

## 2016-03-06 LAB — CBC WITH DIFFERENTIAL/PLATELET
Basophils Absolute: 41 cells/uL (ref 0–200)
Basophils Relative: 1 %
Eosinophils Absolute: 82 cells/uL (ref 15–500)
Eosinophils Relative: 2 %
HCT: 41.5 % (ref 35.0–45.0)
Hemoglobin: 14.2 g/dL (ref 11.7–15.5)
Lymphocytes Relative: 40 %
Lymphs Abs: 1640 cells/uL (ref 850–3900)
MCH: 30.7 pg (ref 27.0–33.0)
MCHC: 34.2 g/dL (ref 32.0–36.0)
MCV: 89.8 fL (ref 80.0–100.0)
MPV: 10 fL (ref 7.5–12.5)
Monocytes Absolute: 492 cells/uL (ref 200–950)
Monocytes Relative: 12 %
Neutro Abs: 1845 cells/uL (ref 1500–7800)
Neutrophils Relative %: 45 %
Platelets: 243 10*3/uL (ref 140–400)
RBC: 4.62 MIL/uL (ref 3.80–5.10)
RDW: 13.7 % (ref 11.0–15.0)
WBC: 4.1 10*3/uL (ref 3.8–10.8)

## 2016-03-06 LAB — LIPID PANEL
Cholesterol: 194 mg/dL (ref 125–200)
HDL: 93 mg/dL (ref >=46)
LDL Cholesterol: 88 mg/dL (ref <130)
Total CHOL/HDL Ratio: 2.1 Ratio (ref <=5.0)
Triglycerides: 63 mg/dL (ref <150)
VLDL: 13 mg/dL (ref <30)

## 2016-03-06 LAB — COMPLETE METABOLIC PANEL WITH GFR
ALT: 15 U/L (ref 6–29)
AST: 21 U/L (ref 10–35)
Albumin: 4.8 g/dL (ref 3.6–5.1)
Alkaline Phosphatase: 78 U/L (ref 33–130)
BUN: 16 mg/dL (ref 7–25)
CO2: 29 mmol/L (ref 20–31)
Calcium: 9.8 mg/dL (ref 8.6–10.4)
Chloride: 102 mmol/L (ref 98–110)
Creat: 0.79 mg/dL (ref 0.50–0.99)
GFR, Est African American: >89 mL/min (ref >=60)
GFR, Est Non African American: 78 mL/min (ref >=60)
Glucose, Bld: 88 mg/dL (ref 65–99)
Potassium: 4.4 mmol/L (ref 3.5–5.3)
Sodium: 139 mmol/L (ref 135–146)
Total Bilirubin: 0.6 mg/dL (ref 0.2–1.2)
Total Protein: 7.4 g/dL (ref 6.1–8.1)

## 2016-03-06 LAB — TSH: TSH: 2.97 mIU/L

## 2016-03-07 LAB — VITAMIN D 25 HYDROXY (VIT D DEFICIENCY, FRACTURES): Vit D, 25-Hydroxy: 58 ng/mL (ref 30–100)

## 2016-03-10 ENCOUNTER — Other Ambulatory Visit: Payer: Medicare HMO | Admitting: Internal Medicine

## 2016-03-12 ENCOUNTER — Encounter: Payer: Self-pay | Admitting: Internal Medicine

## 2016-03-12 ENCOUNTER — Ambulatory Visit (INDEPENDENT_AMBULATORY_CARE_PROVIDER_SITE_OTHER): Payer: Medicare HMO | Admitting: Internal Medicine

## 2016-03-12 VITALS — BP 136/74 | HR 88 | Temp 97.9°F | Ht 63.0 in | Wt 121.5 lb

## 2016-03-12 DIAGNOSIS — I10 Essential (primary) hypertension: Secondary | ICD-10-CM

## 2016-03-12 DIAGNOSIS — C541 Malignant neoplasm of endometrium: Secondary | ICD-10-CM

## 2016-03-12 DIAGNOSIS — Z0000 Encounter for general adult medical examination without abnormal findings: Secondary | ICD-10-CM | POA: Diagnosis not present

## 2016-03-12 DIAGNOSIS — M5432 Sciatica, left side: Secondary | ICD-10-CM | POA: Diagnosis not present

## 2016-03-12 DIAGNOSIS — M8580 Other specified disorders of bone density and structure, unspecified site: Secondary | ICD-10-CM | POA: Diagnosis not present

## 2016-03-12 DIAGNOSIS — E785 Hyperlipidemia, unspecified: Secondary | ICD-10-CM | POA: Diagnosis not present

## 2016-03-12 DIAGNOSIS — Z23 Encounter for immunization: Secondary | ICD-10-CM

## 2016-03-12 MED ORDER — SIMVASTATIN 20 MG PO TABS: ORAL_TABLET | ORAL | 3 refills | Status: DC

## 2016-03-12 NOTE — Progress Notes (Signed)
Subjective:    Patient ID: Annette Tucker, female    DOB: Jun 13, 1946, 70 y.o.   MRN: [id number]  HPI Pleasant 70 year old Female in today for health maintenance exam and evaluation of medical issues.  In 2015 she noted vaginal spotting. She had a D&C that revealed endometrial cancer and was referred to GYN oncologist. Subsequently underwent robotic-assisted TAH/BSO with lymph node dissection. Lymph nodes were negative. Stage was 1 a. She had a high-grade serous endometrial carcinoma. She received radiation and chemotherapy. Chemotherapy was with Taxol and carboplatin. Continues to be followed by Dr. Marko Plume in GYN oncology. Is doing well BuSpar.  History of osteopenia and osteoarthritis.  She has a history of hypertension and hyperlipidemia both of which are well controlled.  Was diagnosed by an Dr. Aldona Bar with mitral valve prolapse in 2001 on echocardiogram.  She takes vitamin D supplement. Had Zostavax vaccine in September 2011.  Tonsillectomy at age 41. TMJ mandibular advancement surgery 1986. Colonoscopy September 2006.  She took Fosamax for couple of years per osteopenia.  Social history: She completed college and worked in Scientist, research (medical) for a number of years. Her job was terminated at [employer] when it was sold in 2009. She is a native of [place], [place]. Does not smoke or consume alcohol. She is single.  Family history: Mother with history of Alzheimer's dementia. Father died at age 52 with kidney disease and history of MI, hypertension, CVA. One brother with hyperlipidemia.    Review of Systems intermittent issues with left-sided sciatica but this is improved over the past year     Objective:   Physical Exam  Constitutional: She is oriented to person, place, and time. She appears well-developed and well-nourished. No distress.  HENT:  Head: Normocephalic and atraumatic.  Right Ear: External ear normal.  Left Ear: External ear normal.  Mouth/Throat:  Oropharynx is clear and moist.  Eyes: Conjunctivae and EOM are normal. Pupils are equal, round, and reactive to light. Right eye exhibits no discharge. Left eye exhibits no discharge. No scleral icterus.  Neck: Neck supple. No JVD present. No thyromegaly present.  Cardiovascular: Normal rate, regular rhythm, normal heart sounds and intact distal pulses.   No murmur heard. Pulmonary/Chest: Effort normal and breath sounds normal.  Breasts normal female without masses  Abdominal: Soft. Bowel sounds are normal. She exhibits no distension and no mass. There is no tenderness. There is no rebound and no guarding.  Genitourinary:  Genitourinary Comments: Deferred  Musculoskeletal: She exhibits no edema.  Neurological: She is alert and oriented to person, place, and time. She has normal reflexes. She displays normal reflexes. No cranial nerve deficit. Coordination normal.  Skin: Skin is warm and dry. No rash noted. She is not diaphoretic.  Psychiatric: She has a normal mood and affect. Her behavior is normal. Judgment and thought content normal.  Vitals reviewed.         Assessment & Plan:  Normal health maintenance exam per ref essential hypertension stable and under good control  Hyperlipidemia-stable and under control  Endometrial cancer stage IA status post total abdominal hysterectomy BSO robotic-assisted status post radiation and chemotherapy. Followed by GYN oncology and Dr. Marko Plume.  History of left-sided sciatica-intermittent. Has been referred to physical therapy in 2016  Plan: Continue same medications and return in one year. She will monitor her blood pressure at home. Flu vaccine and Pneumovax 23 given today.  Subjective:   Patient presents for Medicare Annual/Subsequent preventive examination.  Review Past Medical/Family/Social:See above  Risk Factors  Current exercise habits: Walks some for exercise Dietary issues discussed: Low fat low carbohydrate  Cardiac risk  factors:Hypertension and hyperlipidemia, family history  Depression Screen  (Note: if answer to either of the following is "Yes", a more complete depression screening is indicated)   Over the past two weeks, have you felt down, depressed or hopeless? No  Over the past two weeks, have you felt little interest or pleasure in doing things? No Have you lost interest or pleasure in daily life? No Do you often feel hopeless? No Do you cry easily over simple problems? No   Activities of Daily Living  In your present state of health, do you have any difficulty performing the following activities?:   Driving? No  Managing money? No  Feeding yourself? No  Getting from bed to chair? No  Climbing a flight of stairs? No  Preparing food and eating?: No  Bathing or showering? No  Getting dressed: No  Getting to the toilet? No  Using the toilet:No  Moving around from place to place: No  In the past year have you fallen or had a near fall?:No  Are you sexually active? No  Do you have more than one partner? No   Hearing Difficulties: No  Do you often ask people to speak up or repeat themselves? No  Do you experience ringing or noises in your ears? No  Do you have difficulty understanding soft or whispered voices? No  Do you feel that you have a problem with memory? No Do you often misplace items? No    Home Safety:  Do you have a smoke alarm at your residence? Yes Do you have grab bars in the bathroom?No Do you have throw rugs in your house?Yes   Cognitive Testing  Alert? Yes Normal Appearance?Yes  Oriented to person? Yes Place? Yes  Time? Yes  Recall of three objects? Yes  Can perform simple calculations? Yes  Displays appropriate judgment?Yes  Can read the correct time from a watch face?Yes   List the Names of Other Physician/Practitioners you currently use:  See referral list for the physicians patient is currently seeing.     Review of Systems: See above   Objective:      General appearance: Appears stated age   Head: Normocephalic, without obvious abnormality, atraumatic  Eyes: conj clear, EOMi PEERLA  Ears: normal TM's and external ear canals both ears  Nose: Nares normal. Septum midline. Mucosa normal. No drainage or sinus tenderness.  Throat: lips, mucosa, and tongue normal; teeth and gums normal  Neck: no adenopathy, no carotid bruit, no JVD, supple, symmetrical, trachea midline and thyroid not enlarged, symmetric, no tenderness/mass/nodules  No CVA tenderness.  Lungs: clear to auscultation bilaterally  Breasts: normal appearance, no masses or tenderness Heart: regular rate and rhythm, S1, S2 normal, no murmur, click, rub or gallop  Abdomen: soft, non-tender; bowel sounds normal; no masses, no organomegaly  Musculoskeletal: ROM normal in all joints, no crepitus, no deformity, Normal muscle strengthen. Back  is symmetric, no curvature. Skin: Skin color, texture, turgor normal. No rashes or lesions  Lymph nodes: Cervical, supraclavicular, and axillary nodes normal.  Neurologic: CN 2 -12 Normal, Normal symmetric reflexes. Normal coordination and gait  Psych: Alert & Oriented x 3, Mood appear stable.    Assessment:    Annual wellness medicare exam   Plan:    During the course of the visit the patient was educated and counseled about appropriate screening and preventive services including:  Annual mammogram  Annual flu vaccine  Prevnar 23      Patient Instructions (the written plan) was given to the patient.  Medicare Attestation  I have personally reviewed:  The patient's medical and social history  Their use of alcohol, tobacco or illicit drugs  Their current medications and supplements  The patient's functional ability including ADLs,fall risks, home safety risks, cognitive, and hearing and visual impairment  Diet and physical activities  Evidence for depression or mood disorders  The patient's weight, height, BMI, and visual  acuity have been recorded in the chart. I have made referrals, counseling, and provided education to the patient based on review of the above and I have provided the patient with a written personalized care plan for preventive services.

## 2016-03-13 LAB — POCT URINALYSIS DIPSTICK
Bilirubin, UA: NEGATIVE
Blood, UA: NEGATIVE
Glucose, UA: NEGATIVE
Ketones, UA: NEGATIVE
Leukocytes, UA: NEGATIVE
Nitrite, UA: NEGATIVE
Protein, UA: NEGATIVE
Spec Grav, UA: 1.025
Urobilinogen, UA: NEGATIVE
pH, UA: 5

## 2016-03-17 DIAGNOSIS — H5212 Myopia, left eye: Secondary | ICD-10-CM | POA: Diagnosis not present

## 2016-03-17 DIAGNOSIS — Z0100 Encounter for examination of eyes and vision without abnormal findings: Secondary | ICD-10-CM | POA: Diagnosis not present

## 2016-03-17 DIAGNOSIS — H25813 Combined forms of age-related cataract, bilateral: Secondary | ICD-10-CM | POA: Diagnosis not present

## 2016-03-17 DIAGNOSIS — H524 Presbyopia: Secondary | ICD-10-CM | POA: Diagnosis not present

## 2016-03-17 DIAGNOSIS — H52223 Regular astigmatism, bilateral: Secondary | ICD-10-CM | POA: Diagnosis not present

## 2016-03-19 ENCOUNTER — Encounter: Payer: Self-pay | Admitting: Internal Medicine

## 2016-03-19 DIAGNOSIS — Z803 Family history of malignant neoplasm of breast: Secondary | ICD-10-CM | POA: Diagnosis not present

## 2016-03-19 DIAGNOSIS — Z1231 Encounter for screening mammogram for malignant neoplasm of breast: Secondary | ICD-10-CM | POA: Diagnosis not present

## 2016-03-23 ENCOUNTER — Encounter: Payer: Self-pay | Admitting: Internal Medicine

## 2016-03-23 DIAGNOSIS — R928 Other abnormal and inconclusive findings on diagnostic imaging of breast: Secondary | ICD-10-CM | POA: Diagnosis not present

## 2016-03-23 DIAGNOSIS — Z803 Family history of malignant neoplasm of breast: Secondary | ICD-10-CM | POA: Diagnosis not present

## 2016-03-25 NOTE — Patient Instructions (Signed)
It was pleasure to see you today. Continue same medications and return in one year.

## 2016-04-02 ENCOUNTER — Ambulatory Visit
Admission: RE | Admit: 2016-04-02 | Discharge: 2016-04-02 | Disposition: A | Discharge status: Home / Self Care | Payer: Medicare HMO | Source: Ambulatory Visit | Attending: Radiation Oncology | Admitting: Radiation Oncology

## 2016-04-02 ENCOUNTER — Encounter: Payer: Self-pay | Admitting: Radiation Oncology

## 2016-04-02 VITALS — BP 152/90 | HR 77 | Temp 98.3°F | Ht 63.0 in | Wt 120.4 lb

## 2016-04-02 DIAGNOSIS — Z90722 Acquired absence of ovaries, bilateral: Secondary | ICD-10-CM | POA: Diagnosis not present

## 2016-04-02 DIAGNOSIS — Z90710 Acquired absence of both cervix and uterus: Secondary | ICD-10-CM | POA: Insufficient documentation

## 2016-04-02 DIAGNOSIS — Z7982 Long term (current) use of aspirin: Secondary | ICD-10-CM | POA: Insufficient documentation

## 2016-04-02 DIAGNOSIS — Z9079 Acquired absence of other genital organ(s): Secondary | ICD-10-CM | POA: Diagnosis not present

## 2016-04-02 DIAGNOSIS — Z08 Encounter for follow-up examination after completed treatment for malignant neoplasm: Secondary | ICD-10-CM | POA: Diagnosis not present

## 2016-04-02 DIAGNOSIS — C541 Malignant neoplasm of endometrium: Secondary | ICD-10-CM | POA: Diagnosis not present

## 2016-04-02 NOTE — Progress Notes (Signed)
Radiation Oncology         [phone] ________________________________  Name: Annette Tucker MRN: [id number]  Date: 04/02/2016  DOB: [birth year]  Follow-Up Visit Note  CC: [name], MD  [name], MD    ICD-9-CM ICD-10-CM   1. Endometrial ca ([place]) 182.0 C54.1     Diagnosis:  FIGO stage IA high-grade endometrial serous carcinoma    Interval Since Last Radiation:  February 23, March 1, March 8, March 10, September 18, 2014, (1 year, 6 months)  Site/dose:   Vaginal cuff 30 gray in 5 fractions  Narrative: Annette Tucker here for follow up. In 2015, she noted vaginal spotting. She had a D&C that revealed endometrial cancer and was referred to GYN oncologist. Subsequently underwent robotic-assisted TAH/BSO with lymph node dissection. Lymph nodes were negative. Stage was IA. She had a high-grade serous endometrial carcinoma. She received radiation and chemotherapy. Chemotherapy was with Taxol and Carboplatin. Continues to be followed by [name] in GYN oncology.  Annette Tucker is here for follow up after HDR treatment. She denies having pain, bowel/bladder issues, vagina;/rectal bleeding or discharge. She reports her energy level is back to normal. She is using vaginal dilator 3 times a week.  Pap smear was performed by [name] on her last examination.   ALLERGIES:  is allergic to latex.  Meds: Current Outpatient Prescriptions  Medication Sig Dispense Refill  . acetaminophen (TYLENOL) 500 MG tablet Take 500 mg by mouth once as needed for moderate pain.     Marland Kitchen aspirin 81 MG EC tablet Take 81 mg by mouth every evening.     . Calcium Carbonate-Vitamin D (CALCIUM 600+D) 600-400 MG-UNIT per tablet Take 1 tablet by mouth 2 (two) times daily. @lunch  and in the evening    . cholecalciferol (VITAMIN D) 1000 UNITS tablet Take 1,000 Units by mouth daily.     . Coenzyme Q10 (CO Q-10) 100 MG CAPS Take 1 capsule by mouth every morning.     . fish oil-omega-3 fatty acids 1000 MG capsule Take 1 g  by mouth every morning.     Marland Kitchen ibuprofen (ADVIL,MOTRIN) 200 MG tablet Take 200 mg by mouth every 6 (six) hours as needed.    . loratadine (CLARITIN) 10 MG tablet Take 10 mg by mouth daily as needed for allergies. Reported on 10/03/2015    . Magnesium 250 MG TABS Take 1 tablet by mouth every evening.     . Multiple Vitamins-Minerals (MULTIVITAMIN WITH MINERALS) tablet Take 1 tablet by mouth every morning.     Marland Kitchen PROCTOZONE-HC 2.5 % rectal cream Place 1 application rectally as needed. Reported on 10/03/2015    . simvastatin (ZOCOR) 20 MG tablet TAKE 1 TABLET (20 MG TOTAL) BY MOUTH AT BEDTIME. 90 tablet 3  . vitamin E 400 UNIT capsule Take 400 Units by mouth every morning.     . Flaxseed, Linseed, (FLAX SEED OIL PO) Take 1,000 Units by mouth every evening. Reported on 10/03/2015     No current facility-administered medications for this encounter.     Physical Findings: The patient is in no acute distress. Patient is alert and oriented.  height is 5\' 3"  (1.6 m) and weight is 120 lb 6.4 oz (54.6 kg). Her oral temperature is 98.3 F (36.8 C). Her blood pressure is 152/90 (abnormal) and her pulse is 77. Her oxygen saturation is 100%.  Lungs are clear to auscultation bilaterally. Heart has regular rate and rhythm. No palpable cervical, supraclavicular, or axillary adenopathy. Abdomen  is soft, non-tender, and non-distended. No hepatosplenomegaly.  No inguinal adenopathy. External genitalia unremarkable.     Pelvic Exam: Mild radiation changes in the proximal vagina. No mucosal lesions. The vaginal mucosa is atrophic. No pelvic masses on bimanual and rectovaginal exam. Rectal Sphincter tone normal.    Lab Findings: Lab Results  Component Value Date   WBC 4.1 03/06/2016   HGB 14.2 03/06/2016   HCT 41.5 03/06/2016   MCV 89.8 03/06/2016   PLT 243 03/06/2016    Radiographic Findings: CT scan of the abdomen and pelvis in December 2016 showing no evidence of recurrence  Impression: FIGO stage IA high-grade  endometrial serous carcinoma. No evidence of recurrence on clinical exam today.   Plan: She will follow up with [name] in 3 months. Follow up with me in 6 months.  ____________________________________  Blair Promise, PhD, MD  This document serves as a record of services personally performed by Gery Pray, MD. It was created on his behalf by Bethann Humble, a trained medical scribe. The creation of this record is based on the scribe's personal observations and the provider's statements to them. This document has been checked and approved by the attending provider.

## 2016-04-02 NOTE — Progress Notes (Signed)
Alric Quan here for follow up after HDR treatment.  She denies having pain, bowel/bladder issues, vaginal/rectal bleeding or discharge.  She reports her energy level is back to normal.  She is using a vaginal dilator 3 times a week.  BP (!) 152/90 (BP Location: Right Arm, Patient Position: Sitting)   Pulse 77   Temp 98.3 F (36.8 C) (Oral)   Ht 5\' 3"  (1.6 m)   Wt 120 lb 6.4 oz (54.6 kg)   SpO2 100%   BMI 21.33 kg/m    Wt Readings from Last 3 Encounters:  04/02/16 120 lb 6.4 oz (54.6 kg)  03/12/16 121 lb 8 oz (55.1 kg)  12/25/15 119 lb 9.6 oz (54.3 kg)

## 2016-04-02 NOTE — Addendum Note (Signed)
Encounter addended by: Jacqulyn Liner, RN on: 04/02/2016 11:54 AM<BR>    Actions taken: Charge Capture section accepted

## 2016-05-12 IMAGING — CR DG CHEST 2V
2 series · 2 of 2 positions shown · non-contrast
Comparison: 05/27/2009

CLINICAL DATA: Endometrial carcinoma.  Pre-op respiratory exam.

EXAM:
CHEST  2 VIEW

[w chest pa]
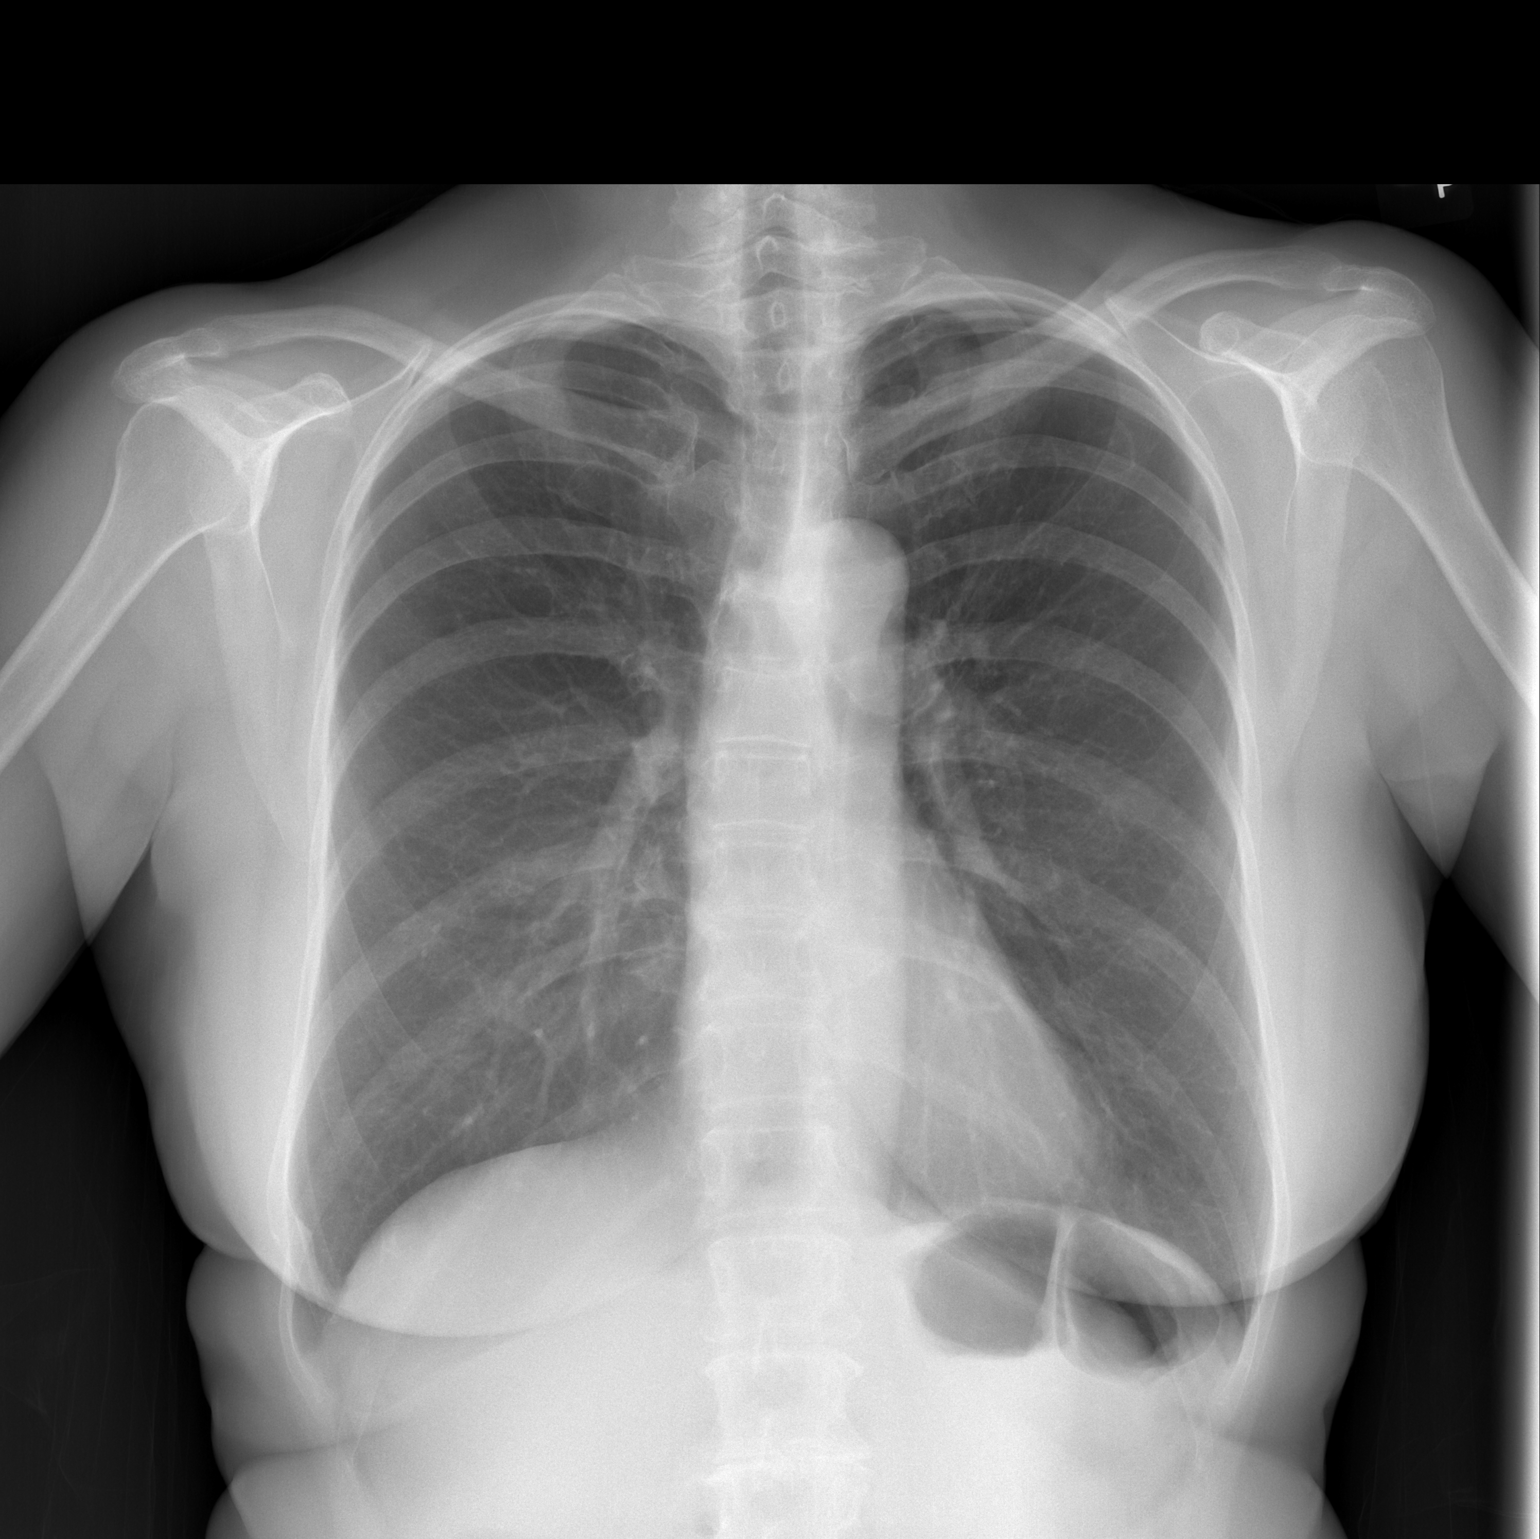

[w chest lat]
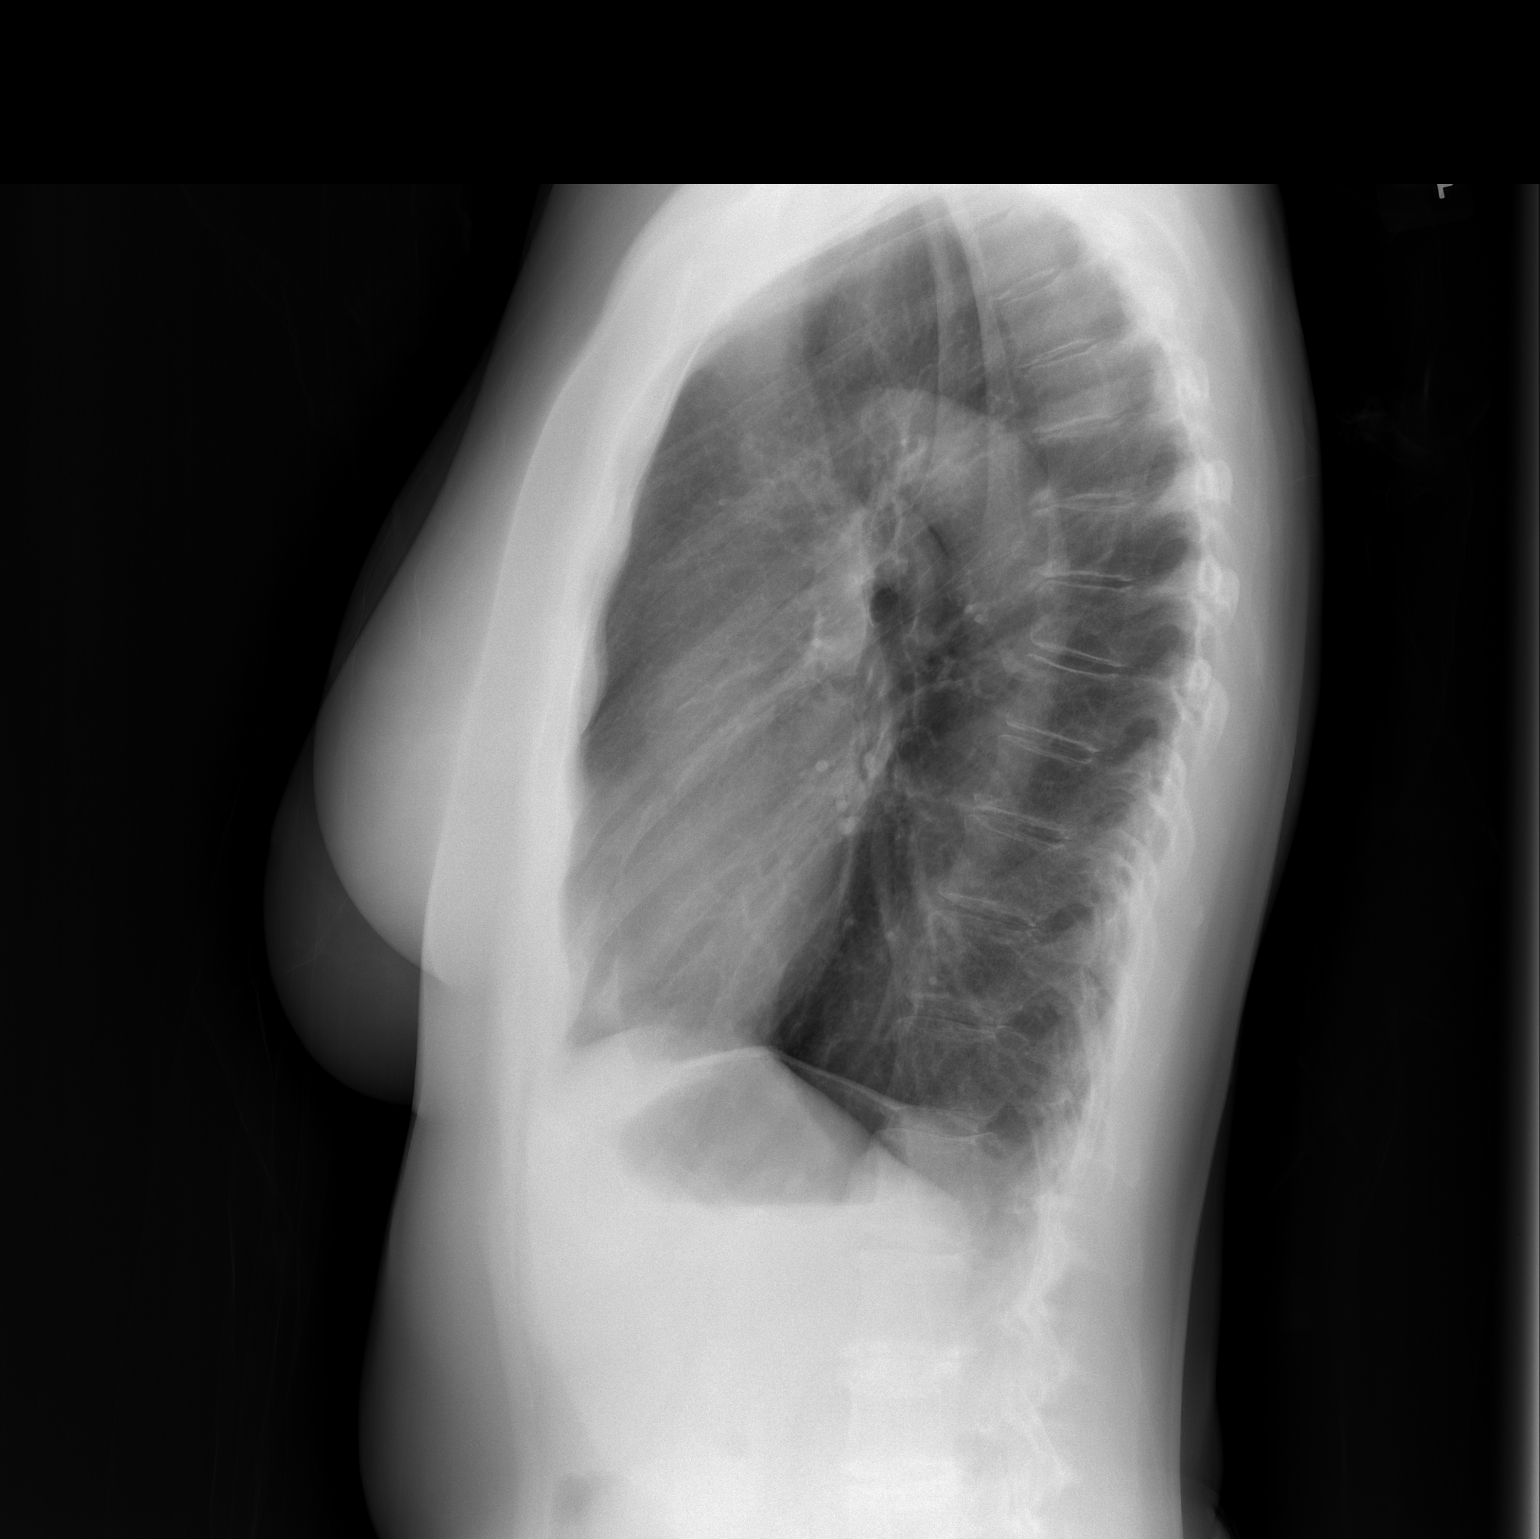

[2 of 2 positions shown; findings below may reference images not displayed]

FINDINGS: The heart size and mediastinal contours are within normal limits.
Both lungs are clear. The visualized skeletal structures are
unremarkable.
IMPRESSION: No active cardiopulmonary disease.

## 2016-05-12 IMAGING — CT CT ABD-PELV W/ CM
2 of 5 series · 16 of 46 positions shown, 18 images · IV contrast (omnipaque)
Comparison: None.

CLINICAL DATA: Endometrial carcinoma. Low abdominal pain with
vaginal bleeding. Total hysterectomy scheduled for next week.
Initial encounter.

EXAM:
CT ABDOMEN AND PELVIS WITH CONTRAST
TECHNIQUE: Multidetector CT imaging of the abdomen and pelvis was performed
using the standard protocol following bolus administration of
intravenous contrast.
CONTRAST:  100mL OMNIPAQUE IOHEXOL 300 MG/ML  SOLN

[Series 2: rtn a/p with · axial · 0.63mm/px · z∈[-430,-86]mm · 13 of 79 slices shown, 15 images]
[im 5/79  soft-tissue]
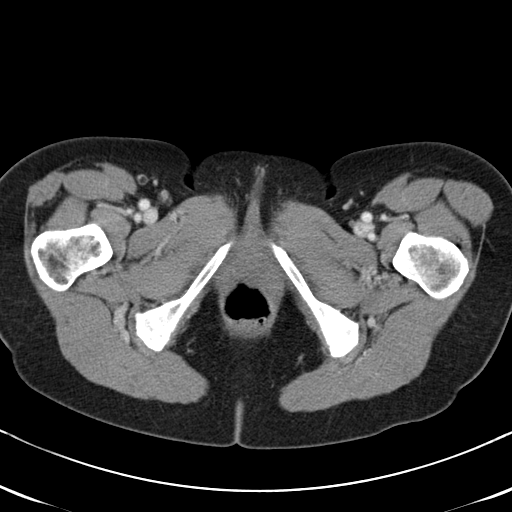
[im 5/79  bone]
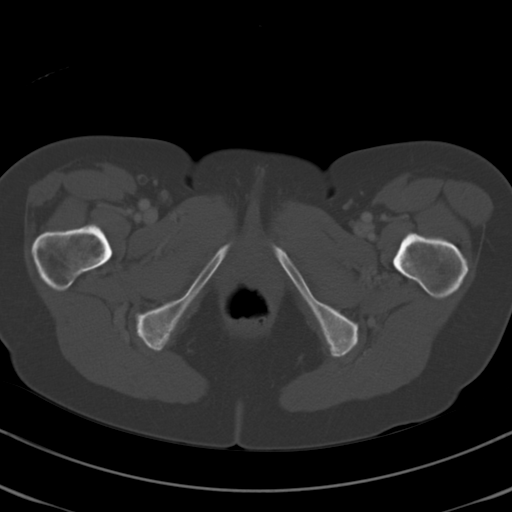
[im 9/79  soft-tissue]
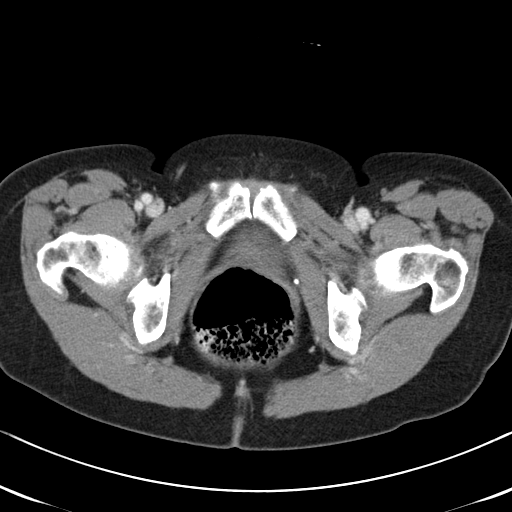
[im 18/79  soft-tissue]
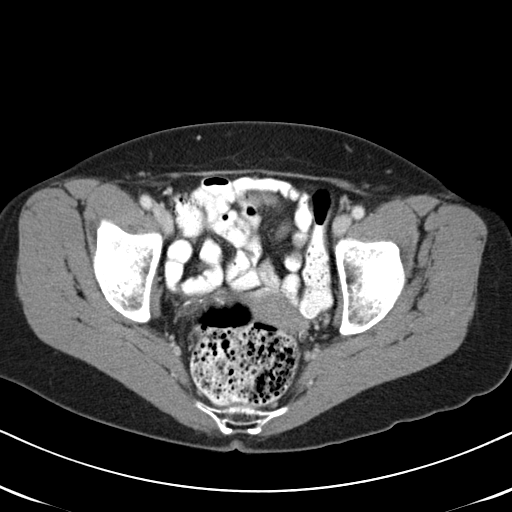
[im 22/79  soft-tissue]
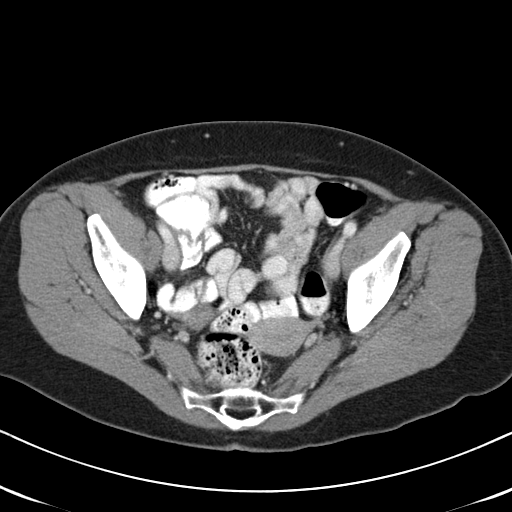
[im 27/79  soft-tissue]
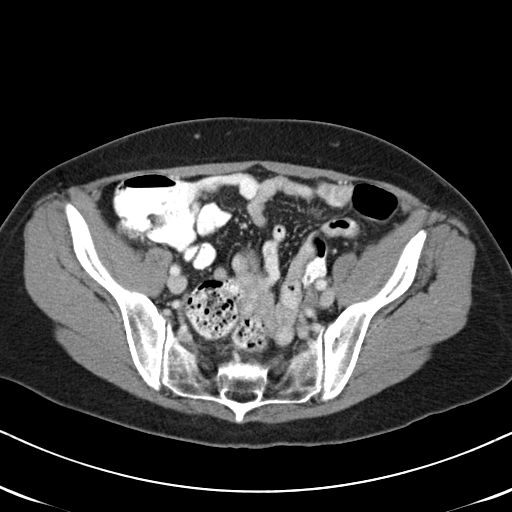
[im 35/79  soft-tissue]
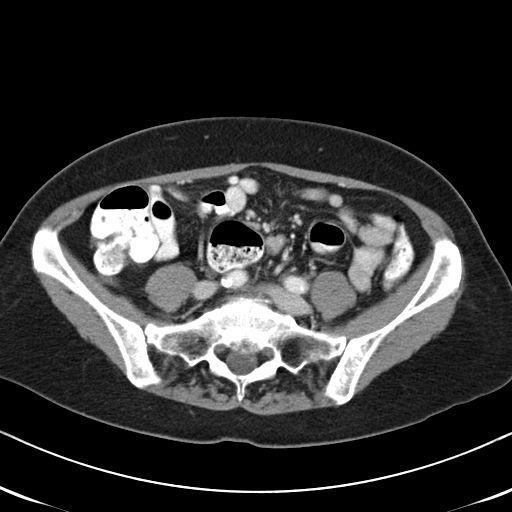
[im 40/79  soft-tissue]
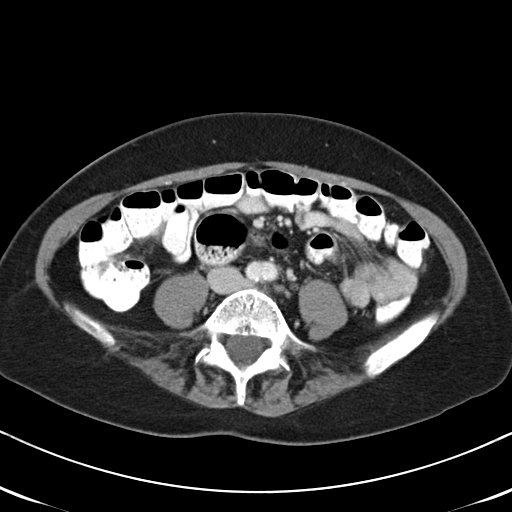
[im 44/79  soft-tissue]
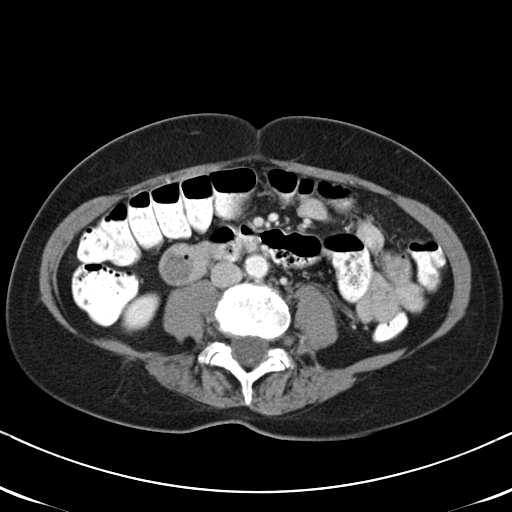
[im 53/79  soft-tissue]
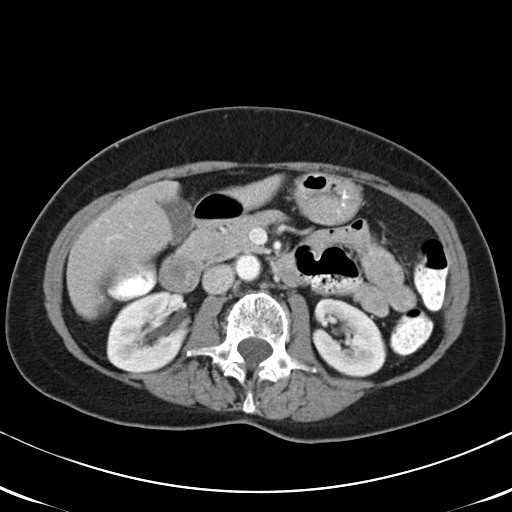
[im 53/79  bone]
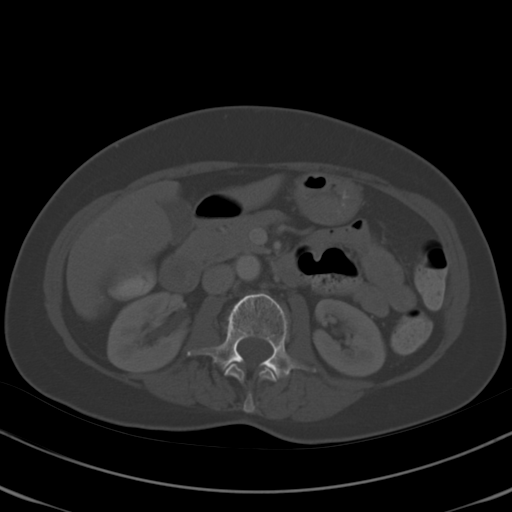
[im 57/79  soft-tissue]
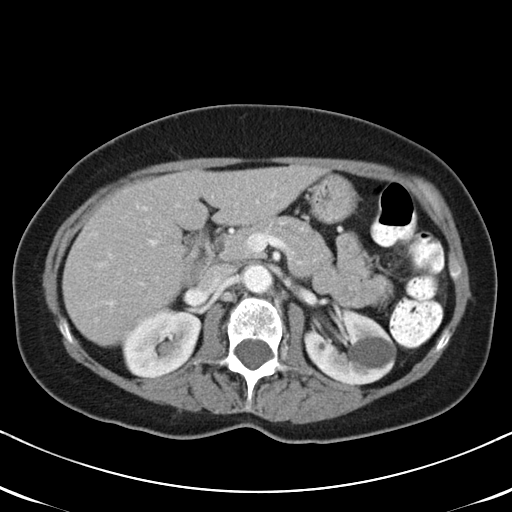
[im 61/79  soft-tissue]
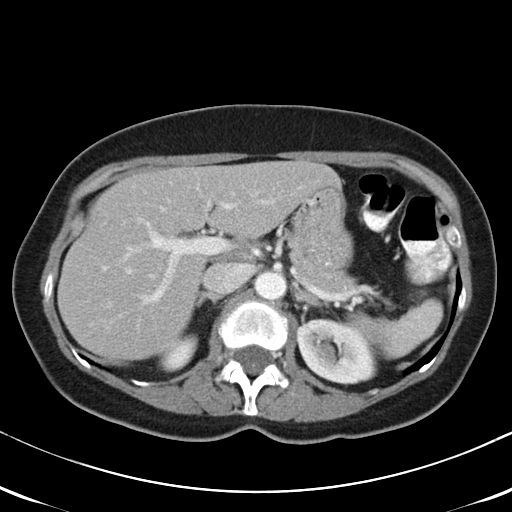
[im 70/79  soft-tissue]
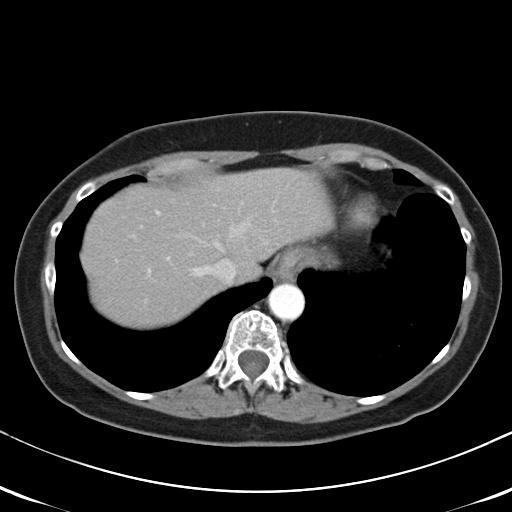
[im 74/79  soft-tissue]
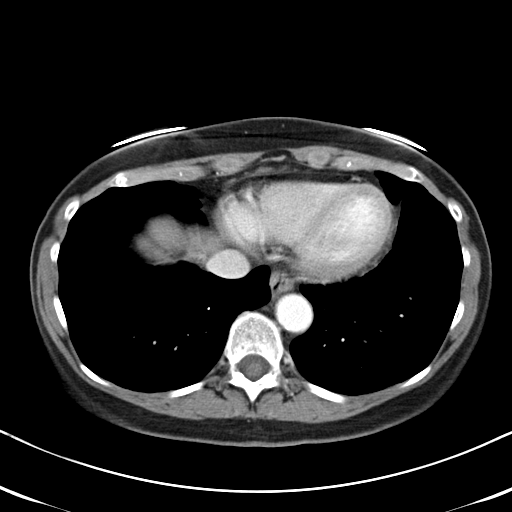

[Series 602: <mpr thick range> · coronal · 0.77mm/px · 3 of 82 slices shown]
[im 28/82  soft-tissue]
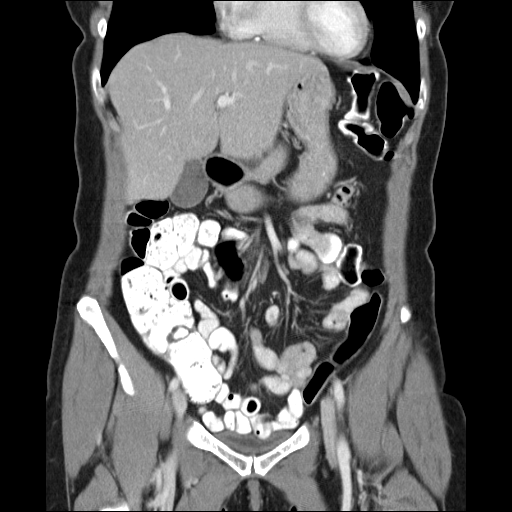
[im 37/82  soft-tissue]
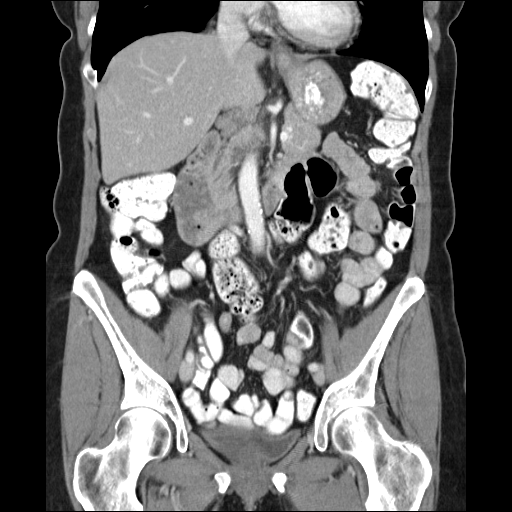
[im 46/82  soft-tissue]
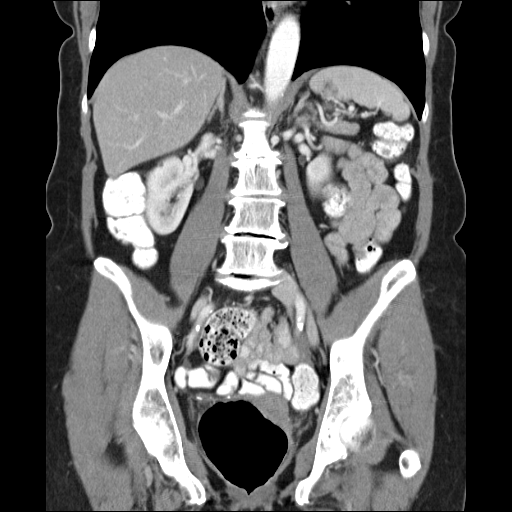

[16 of 46 positions shown; findings below may reference images not displayed]

FINDINGS: Lower chest: Clear lung bases. No significant pleural or pericardial
effusion.

Hepatobiliary: The liver is normal in density without focal
abnormality. No evidence of gallstones, gallbladder wall thickening
or biliary dilatation.

Pancreas: Unremarkable. No pancreatic ductal dilatation or
surrounding inflammatory changes.

Spleen: Normal in size without focal abnormality.

Adrenals/Urinary Tract: Both adrenal glands appear normal.There is a
2.8 cm simple cyst in the interpolar region of the left kidney. The
right kidney appears normal aside from a possible tiny
nonobstructing caliceal calculus in the interpolar region, best seen
on coronal image number 55. There is no hydronephrosis or delay in
contrast excretion. The bladder demonstrates no abnormality,
although is nearly empty and suboptimally evaluated.

Stomach/Bowel: No evidence of bowel wall thickening, distention or
surrounding inflammatory change.There is prominent stool throughout
the colon, especially within the rectum.

Vascular/Lymphatic: There are no enlarged abdominal or pelvic lymph
nodes. There is mild aortoiliac atherosclerosis.

Reproductive: The uterus is atrophied and retroverted. No myometrial
mass or significant distention of the endometrial cavity
demonstrated. There is no evidence of parametrial extension of tumor
or adnexal mass.

Other: No evidence of abdominal wall mass or hernia.

Musculoskeletal: No acute or significant osseous findings. There are
degenerative changes throughout the lumbar spine.
IMPRESSION: 1. No evidence of metastatic endometrial carcinoma. The primary
malignancy is not well visualized.
2. No evidence of rectal or bladder involvement.  No hydronephrosis.
3. Left renal cyst, atherosclerosis and lumbar spondylosis noted.

## 2016-06-25 ENCOUNTER — Other Ambulatory Visit: Payer: Self-pay | Admitting: Nurse Practitioner

## 2016-07-15 ENCOUNTER — Ambulatory Visit: Payer: Medicare HMO | Admitting: Gynecologic Oncology

## 2016-07-21 DIAGNOSIS — R69 Illness, unspecified: Secondary | ICD-10-CM | POA: Diagnosis not present

## 2016-08-25 NOTE — Progress Notes (Signed)
Consult Note: Gyn-Onc  Annette Tucker 72 y.o. female  CC:  Chief Complaint  Patient presents with  . Endometrial Cancer    HPI: Patient was initially seen in consultation at the request of Dr. Pamala Hurry.  Patient is a 71 year old gravida 0 who began experiencing some low back pain and then spotting starting in August.. She had an exam which showed significant atrophy and hormone cream was prescribed. She then continued to have some spotting and discharge went to see Dr. Pamala Hurry. Ultrasound was performed that revealed a 4 x 3 x 2 centimeter uterus with a 4.8 milliliter endometrial stripe. There were 2 small lesions measuring 0.5 cm each consistent with polyps. She had been on hormone replacement therapy until 2012. She had been on it approximately 10 years. Pathology from her D&C revealed high-grade endometrial cancer with serous features. For this reason that she is referred to [place] today.  She's due for colonoscopy in 2016. Her primary physician is Dr. Tedra Senegal.  06/05/14: Surgery: Total robotic hysterectomy bilateral salpingo-oophorectomy, bilateral pelvic and para-aortic lymph node dissection.   Operative findings: Normal uterus, cervix, adnexal and abdominal survey, No pathologically enlarged nodes identified.   Pathology:  Diagnosis 1. Lymph node, biopsy, right para aortic nodes - ONE LYMPH NODE, NEGATIVE FOR METASTATIC CARCINOMA (0/1). 2. Lymph node, biopsy, left para aortic node - THREE LYMPH NODES, NEGATIVE FOR METASTATIC CARCINOMA (0/3). 3. Lymph nodes, regional resection, right pelvic - FOUR LYMPH NODES, NEGATIVE FOR METASTATIC CARCINOMA (0/4). 4. Lymph nodes, regional resection, left pelvic - FOUR LYMPH NODES, NEGATIVE FOR METASTATIC CARCINOMA (0/4). 5. Uterus +/- tubes/ovaries, neoplastic, with cervix - SUPERFICIALLY INVASIVE SEROUS CARCINOMA, 0.5 CM, CONFINED WITHIN THE INNER HALF OF THE MYOMETRIUM. (20%, no LVIS) - ADJACENT BENIGN ENDOMETRIAL POLYPS WITH NO EVIDENCE OF  ATYPIA OR MALIGNANCY. - CERVIX: BENIGN SQUAMOUS MUCOSA AND ENDOCERVICAL MUCOSA, NO DYSPLASIA OR MALIGNANCY. - BILATERAL OVARIES: BENIGN OVARIAN TISSUE WITH ENDOMETRIOSIS AND ENDOSALPINGIOSIS, NO ATYPIA OR MALIGNANCY. - BILATERAL FALLOPIAN TUBES: BENIGN FALLOPIAN TUBAL TISSUE, NO EVIDENCE OF ATYPIA OR MALIGNANCY. - MYOMETRIUM: LEIOMYOMATA.  We saw her for her postoperative check in the latter part of January 2016. Since that time she's completed 6 cycles of dose dense paclitaxel and carboplatin. She completed day #8 of cycle #6 on Nov 01, 2014. In addition, she's completed her high-dose vaginal cuff brachytherapy with Dr. Sondra Come.  She had a posttreatment CT scan that revealed (12/27/14): FINDINGS: Lower chest: Lung bases are clear.  Hepatobiliary: No focal hepatic lesion. No biliary duct dilatation.Gallbladder is normal. Common bile duct is normal.  Pancreas: Pancreas is normal. No ductal dilatation. No pancreatic inflammation.  Spleen: Linear hypodense band within the mid spleen extending from the hilum to the capsule (image 12, series 2) most suggestive of a splenic infarction. Splenic laceration could have a similar pattern. No evidence of acute injury.  Adrenals/urinary tract: Adrenal glands are normal. There is a nonenhancing cyst of the left kidney. Ureters are normal. The bladder is normal.  Stomach/Bowel: Stomach, small-bowel, and cecum normal. The colon and rectosigmoid colon normal. There is a large stool ball in the rectum measuring 7 cm in diameter.  Vascular/Lymphatic: Abdominal aorta is normal caliber. There is minimal of vascular intimal calcification. Splenic artery appears normal.  There is no retroperitoneal lymphadenopathy. No periportal adenopathy. No mesenteric adenopathy. No pelvic lymphadenopathy.  Reproductive: Post hysterectomy anatomy. No adnexal abnormality.  Musculoskeletal: No aggressive osseous lesion.  Other: No free fluid.  IMPRESSION: 1. No  evidence of endometrial carcinoma recurrence within the  abdomen or pelvis. 2. New linear band within the spleen is most suggestive of a splenic infarction. 3. Probable benign left renal cyst. 4. Large stool ball in the rectum.   She saw Dr. Renold Genta in October and was seen by Dr. Sondra Come in April 2017 at which time she had a Pap smear that was unremarkable in 10/16. She had a follow-up CT scan 06/26/2015. It showed no evidence of metastatic disease. She had resolution of the splenic infarct.  She had a colonoscopy and a recommended follow-up in 10 years. She is up-to-date on her mammograms and her bone densities.  Interval History: I last saw her in 6/17 at which time her exam and pap smear were negative. She saw Dr. Sondra Come with a negative exam in 10/17. She is doing well and comes in today for follow up. She would like for [place] to do her Pap smear today as it was more comfortable for her. She overall doing quite well. She feels quite normal she is planning a big family trip to [place] in [place] and may. She does occasionally have some loose stools and she thinks is secondary to her high-fiber diet. She will he prunes every 3-4 days to help with avoiding constipation. She has a bowel movement every day. She is exercising on her treadmill and using her stationary bike. Her mammograms are up-to-date. Her weight is stable.  Review of Systems  Constitutional: Denies fever. Skin: No rash Cardiovascular: No chest pain, shortness of breath, or edema  Pulmonary: No cough   Gastro Intestinal: No nausea, vomiting, constipation, or diarrhea reported. No bright red blood per rectum or change in bowel movement.  Genitourinary: Denies vaginal bleeding and discharge.  Musculoskeletal:  +LBP, no change, chronic Neurologic: No neuropathy Psychology: No changes   Current Meds:  Outpatient Encounter Prescriptions as of 08/26/2016  Medication Sig  . acetaminophen (TYLENOL) 500 MG tablet Take 500 mg by  mouth once as needed for moderate pain.   Marland Kitchen aspirin 81 MG EC tablet Take 81 mg by mouth every evening.   . Calcium Carbonate-Vitamin D (CALCIUM 600+D) 600-400 MG-UNIT per tablet Take 1 tablet by mouth 2 (two) times daily. @lunch  and in the evening  . cholecalciferol (VITAMIN D) 1000 UNITS tablet Take 1,000 Units by mouth daily.   . Coenzyme Q10 (CO Q-10) 100 MG CAPS Take 1 capsule by mouth every morning.   . fish oil-omega-3 fatty acids 1000 MG capsule Take 1 g by mouth every morning.   . Flaxseed, Linseed, (FLAX SEED OIL PO) Take 1,000 Units by mouth every evening. Reported on 10/03/2015  . ibuprofen (ADVIL,MOTRIN) 200 MG tablet Take 200 mg by mouth every 6 (six) hours as needed.  . loratadine (CLARITIN) 10 MG tablet Take 10 mg by mouth daily as needed for allergies. Reported on 10/03/2015  . Magnesium 250 MG TABS Take 1 tablet by mouth every evening.   . Multiple Vitamins-Minerals (MULTIVITAMIN WITH MINERALS) tablet Take 1 tablet by mouth every morning.   Marland Kitchen PROCTOZONE-HC 2.5 % rectal cream Place 1 application rectally as needed. Reported on 10/03/2015  . simvastatin (ZOCOR) 20 MG tablet TAKE 1 TABLET (20 MG TOTAL) BY MOUTH AT BEDTIME.  . vitamin E 400 UNIT capsule Take 400 Units by mouth every morning.    No facility-administered encounter medications on file as of 08/26/2016.     Allergy:  Allergies  Allergen Reactions  . Latex Rash    Social Hx:   Social History   Social History  .  Marital status: Single    Spouse name: N/A  . Number of children: 0  . Years of education: N/A   Occupational History  . retired Scientist, research (medical)     Social History Main Topics  . Smoking status: Never Smoker  . Smokeless tobacco: Never Used  . Alcohol use Yes     Comment: rarely  . Drug use: No  . Sexual activity: No   Other Topics Concern  . Not on file   Social History Narrative  . No narrative on file    Past Surgical Hx:  Past Surgical History:  Procedure Laterality Date  . BREAST SURGERY      left breast bx - benign  . COLONOSCOPY  2006  . DILATATION & CURETTAGE/HYSTEROSCOPY WITH TRUECLEAR N/A 05/17/2014   Procedure: HYSTEROSCOPY WITH POLYPECTOMY;  Surgeon: Claiborne Billings A. Pamala Hurry, MD;  Location: [facility];  Service: Gynecology;  Laterality: N/A;  . LYMPH NODE DISSECTION N/A 06/05/2014   Procedure: LYMPH NODE DISSECTION;  Surgeon: Imagene Gurney A. Alycia Rossetti, MD;  Location: [facility];  Service: Gynecology;  Laterality: N/A;  . ROBOTIC ASSISTED TOTAL HYSTERECTOMY WITH BILATERAL SALPINGO OOPHERECTOMY N/A 06/05/2014   Procedure: ROBOTIC ASSISTED TOTAL HYSTERECTOMY WITH BILATERAL SALPINGO OOPHORECTOMY;  Surgeon: Imagene Gurney A. Alycia Rossetti, MD;  Location: [facility];  Service: Gynecology;  Laterality: N/A;  . tmj mandibular advancement    . TONSILLECTOMY     age 69  . WISDOM TOOTH EXTRACTION      Past Medical Hx:  Past Medical History:  Diagnosis Date  . Cancer [facility])    ENDOMETRIAL CANCER  . Heart murmur    as child, no problems   . Hyperlipidemia   . Hypertension   . Radiation 08/21/14, 08/28/14, 09/04/14, 09/06/14, 09/18/14   vaginal cuff brachytherapy 30 gray  . Seasonal allergies     Oncology Hx:    Endometrial ca ([facility])   05/17/2014 Initial Diagnosis    Endometrial ca      06/05/2014 Surgery    TRH/BSO and staging. IAUPSC 0/12 nodes, no LVSI      07/12/2014 -  Chemotherapy    paclitaxel and carboplatin      08/21/2014 - 09/18/2014 Radiation Therapy          Uterine cancer ([place])   05/17/2014 Initial Diagnosis    Uterine cancer       - 11/01/2014 Chemotherapy    Completed 6 cycles of paclitaxel and carboplatin based chemotherapy      06/05/2014 Surgery    IA UPSC       Radiation Therapy          Family Hx:  Family History  Problem Relation Age of Onset  . Mental retardation Mother   . Stroke Mother   . Kidney disease Mother     Vitals:  Blood pressure (!) 153/82, pulse 82, temperature 97.7 F (36.5 C), temperature source Oral, resp. rate 18, height 5\' 3"  (1.6 m), weight 120 lb 8 oz (54.7  kg), SpO2 100 %.  Physical Exam: Well-nourished well-developed female in no acute distress.  Neck: Supple, no lymphadenopathy, no thyromegaly.  Lungs: Clear to auscultation bilateral.  Cardiac: Regular rate and rhythm  Abdomen: Soft, nontender, nondistended. There are no palpable masses or hepatosplenomegaly. Well-healed surgical incisions with no evidence of any incisional hernias.  Extremities: No edema.  Groins: No lymphadenopathy.  Pelvic: Normal female genitalia. The vagina is markedly atrophic. The vaginal cuff is without lesions.  Pap smear submitted without difficulty. Bimanual examination reveals no tenderness, fluctuance or masses. 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 Her preoperative staging was negative. Her surgical staging was similarly negative. She's been treated adjuvantly with dose dense paclitaxel and carboplatin. She's completed all of her therapy with her last cycle of chemotherapy being Nov 01, 2014. She's had a negative post-treatment CT scan. We reviewed signs and symptoms of recurrent endometrial cancer.  She will follow-up with Dr. Sondra Come in 3 months and return to see [place] in 6 months.  I'll notify her of the results for Pap smear from today. Winfrey Chillemi A., MD 08/26/2016, 2:18 PM

## 2016-08-26 ENCOUNTER — Ambulatory Visit: Payer: Medicare HMO | Attending: Gynecologic Oncology | Admitting: Gynecologic Oncology

## 2016-08-26 ENCOUNTER — Other Ambulatory Visit (HOSPITAL_COMMUNITY)
Admission: RE | Admit: 2016-08-26 | Discharge: 2016-08-26 | Disposition: A | Discharge status: Home / Self Care | Payer: Medicare HMO | Source: Ambulatory Visit | Attending: Gynecologic Oncology | Admitting: Gynecologic Oncology

## 2016-08-26 ENCOUNTER — Encounter: Payer: Self-pay | Admitting: Gynecologic Oncology

## 2016-08-26 ENCOUNTER — Other Ambulatory Visit: Payer: Self-pay | Admitting: Gynecologic Oncology

## 2016-08-26 VITALS — BP 153/82 | HR 82 | Temp 97.7°F | Resp 18 | Ht 63.0 in | Wt 120.5 lb

## 2016-08-26 DIAGNOSIS — C541 Malignant neoplasm of endometrium: Secondary | ICD-10-CM | POA: Insufficient documentation

## 2016-08-26 DIAGNOSIS — Z9221 Personal history of antineoplastic chemotherapy: Secondary | ICD-10-CM | POA: Diagnosis not present

## 2016-08-26 DIAGNOSIS — Z8542 Personal history of malignant neoplasm of other parts of uterus: Secondary | ICD-10-CM | POA: Diagnosis not present

## 2016-08-26 DIAGNOSIS — Z923 Personal history of irradiation: Secondary | ICD-10-CM | POA: Insufficient documentation

## 2016-08-26 DIAGNOSIS — Z888 Allergy status to other drugs, medicaments and biological substances status: Secondary | ICD-10-CM | POA: Insufficient documentation

## 2016-08-26 DIAGNOSIS — I10 Essential (primary) hypertension: Secondary | ICD-10-CM | POA: Diagnosis not present

## 2016-08-26 DIAGNOSIS — Z01411 Encounter for gynecological examination (general) (routine) with abnormal findings: Secondary | ICD-10-CM | POA: Diagnosis not present

## 2016-08-26 DIAGNOSIS — Z841 Family history of disorders of kidney and ureter: Secondary | ICD-10-CM | POA: Diagnosis not present

## 2016-08-26 DIAGNOSIS — Z9889 Other specified postprocedural states: Secondary | ICD-10-CM | POA: Insufficient documentation

## 2016-08-26 DIAGNOSIS — Z823 Family history of stroke: Secondary | ICD-10-CM | POA: Insufficient documentation

## 2016-08-26 DIAGNOSIS — Z7982 Long term (current) use of aspirin: Secondary | ICD-10-CM | POA: Insufficient documentation

## 2016-08-26 DIAGNOSIS — Z79899 Other long term (current) drug therapy: Secondary | ICD-10-CM | POA: Diagnosis not present

## 2016-08-26 DIAGNOSIS — Z818 Family history of other mental and behavioral disorders: Secondary | ICD-10-CM | POA: Diagnosis not present

## 2016-08-26 DIAGNOSIS — Z90710 Acquired absence of both cervix and uterus: Secondary | ICD-10-CM | POA: Diagnosis not present

## 2016-08-26 DIAGNOSIS — M545 Low back pain: Secondary | ICD-10-CM | POA: Diagnosis not present

## 2016-08-26 DIAGNOSIS — E785 Hyperlipidemia, unspecified: Secondary | ICD-10-CM | POA: Diagnosis not present

## 2016-08-26 NOTE — Patient Instructions (Signed)
Follow-up with Dr. Sondra Come in 3 months and return to see [place] in 6 months.

## 2016-08-27 NOTE — Addendum Note (Signed)
Addended by: Clayborne Artist A on: 08/27/2016 01:21 PM   Modules accepted: Orders

## 2016-08-31 LAB — CYTOLOGY - PAP: Diagnosis: NEGATIVE

## 2016-09-03 ENCOUNTER — Telehealth: Payer: Self-pay

## 2016-09-03 NOTE — Telephone Encounter (Signed)
Pt notified about pap results: negative.  No questions or concerns voiced. 

## 2016-09-17 DIAGNOSIS — Z23 Encounter for immunization: Secondary | ICD-10-CM | POA: Diagnosis not present

## 2016-09-17 DIAGNOSIS — L821 Other seborrheic keratosis: Secondary | ICD-10-CM | POA: Diagnosis not present

## 2016-09-17 DIAGNOSIS — D2271 Melanocytic nevi of right lower limb, including hip: Secondary | ICD-10-CM | POA: Diagnosis not present

## 2016-09-17 DIAGNOSIS — D225 Melanocytic nevi of trunk: Secondary | ICD-10-CM | POA: Diagnosis not present

## 2016-09-17 DIAGNOSIS — D2371 Other benign neoplasm of skin of right lower limb, including hip: Secondary | ICD-10-CM | POA: Diagnosis not present

## 2016-09-17 DIAGNOSIS — L814 Other melanin hyperpigmentation: Secondary | ICD-10-CM | POA: Diagnosis not present

## 2016-10-01 ENCOUNTER — Ambulatory Visit: Payer: Self-pay | Admitting: Radiation Oncology

## 2016-11-19 ENCOUNTER — Ambulatory Visit: Payer: Self-pay | Admitting: Radiation Oncology

## 2016-11-26 ENCOUNTER — Encounter: Payer: Self-pay | Admitting: Radiation Oncology

## 2016-11-26 ENCOUNTER — Ambulatory Visit
Admission: RE | Admit: 2016-11-26 | Discharge: 2016-11-26 | Disposition: A | Discharge status: Home / Self Care | Payer: Medicare HMO | Source: Ambulatory Visit | Attending: Radiation Oncology | Admitting: Radiation Oncology

## 2016-11-26 VITALS — BP 157/93 | HR 82 | Temp 98.5°F | Ht 63.0 in | Wt 123.0 lb

## 2016-11-26 DIAGNOSIS — Z90722 Acquired absence of ovaries, bilateral: Secondary | ICD-10-CM | POA: Diagnosis not present

## 2016-11-26 DIAGNOSIS — C541 Malignant neoplasm of endometrium: Secondary | ICD-10-CM

## 2016-11-26 DIAGNOSIS — K5900 Constipation, unspecified: Secondary | ICD-10-CM | POA: Insufficient documentation

## 2016-11-26 DIAGNOSIS — Z08 Encounter for follow-up examination after completed treatment for malignant neoplasm: Secondary | ICD-10-CM | POA: Diagnosis not present

## 2016-11-26 DIAGNOSIS — Z9221 Personal history of antineoplastic chemotherapy: Secondary | ICD-10-CM | POA: Insufficient documentation

## 2016-11-26 DIAGNOSIS — Z8542 Personal history of malignant neoplasm of other parts of uterus: Secondary | ICD-10-CM | POA: Insufficient documentation

## 2016-11-26 DIAGNOSIS — Z79899 Other long term (current) drug therapy: Secondary | ICD-10-CM | POA: Insufficient documentation

## 2016-11-26 DIAGNOSIS — Z90710 Acquired absence of both cervix and uterus: Secondary | ICD-10-CM | POA: Insufficient documentation

## 2016-11-26 DIAGNOSIS — Z91040 Latex allergy status: Secondary | ICD-10-CM | POA: Diagnosis not present

## 2016-11-26 DIAGNOSIS — K649 Unspecified hemorrhoids: Secondary | ICD-10-CM | POA: Insufficient documentation

## 2016-11-26 DIAGNOSIS — Z923 Personal history of irradiation: Secondary | ICD-10-CM | POA: Insufficient documentation

## 2016-11-26 DIAGNOSIS — Z7982 Long term (current) use of aspirin: Secondary | ICD-10-CM | POA: Diagnosis not present

## 2016-11-26 NOTE — Progress Notes (Addendum)
Annette Tucker is here for follow up.  She denies having any pain, bladder issues, vaginal bleeding/discharge or fatigue.  She reports having occasional constipation and is having issues with hemorrhoids  She is using proctozone cream as needed.  She is using a dilator 3 times a week.  BP (!) 157/93 (BP Location: Left Arm, Patient Position: Sitting)   Pulse 82   Temp 98.5 F (36.9 C) (Oral)   Ht 5\' 3"  (1.6 m)   Wt 123 lb (55.8 kg)   SpO2 99%   BMI 21.79 kg/m    Wt Readings from Last 3 Encounters:  11/26/16 123 lb (55.8 kg)  08/26/16 120 lb 8 oz (54.7 kg)  04/02/16 120 lb 6.4 oz (54.6 kg)

## 2016-11-26 NOTE — Progress Notes (Signed)
Radiation Oncology         [phone] ________________________________  Name: Annette Tucker MRN: [id number]  Date: 11/26/2016  DOB: Feb 15, 1946  Follow-Up Visit Note  CC: [name], MD  Gordy Levan, MD    ICD-9-CM ICD-10-CM   1. Endometrial ca ([place]) 182.0 C54.1     Diagnosis:  FIGO stage IA high-grade endometrial serous carcinoma    Interval Since Last Radiation: 1 year 2 months  February 23, March 1, March 8, March 10, September 18, 2014  Site/dose:   Vaginal cuff 30 gray in 5 fractions  Narrative: Annette Tucker here for follow up. In 2015, she noted vaginal spotting. She had a D&C that revealed endometrial cancer and was referred to GYN oncologist. Subsequently underwent robotic-assisted TAH/BSO with lymph node dissection. Lymph nodes were negative. Stage was IA. She had a high-grade serous endometrial carcinoma. She received radiation and chemotherapy. Chemotherapy was with Taxol and Carboplatin. Continues to be followed by [name] in GYN oncology.  Annette Tucker is here for follow up after HDR treatment. She denies having pain, bowel/bladder issues, vagina;/rectal bleeding or discharge. She reports occasional constipation and is having issues with hemorrhoids. She is using proctozone cream as needed. She is using vaginal dilator 3 times a week.  Pap smear was performed by [name] on her last examination.   ALLERGIES:  is allergic to latex.  Meds: Current Outpatient Prescriptions  Medication Sig Dispense Refill  . acetaminophen (TYLENOL) 500 MG tablet Take 500 mg by mouth once as needed for moderate pain.     Marland Kitchen aspirin 81 MG EC tablet Take 81 mg by mouth every evening.     . Calcium Carbonate-Vitamin D (CALCIUM 600+D) 600-400 MG-UNIT per tablet Take 1 tablet by mouth 2 (two) times daily. @lunch  and in the evening    . cholecalciferol (VITAMIN D) 1000 UNITS tablet Take 1,000 Units by mouth daily.     . Coenzyme Q10 (CO Q-10) 100 MG CAPS Take 1 capsule by mouth every  morning.     . fish oil-omega-3 fatty acids 1000 MG capsule Take 1 g by mouth every morning.     Marland Kitchen ibuprofen (ADVIL,MOTRIN) 200 MG tablet Take 200 mg by mouth every 6 (six) hours as needed.    . loratadine (CLARITIN) 10 MG tablet Take 10 mg by mouth daily as needed for allergies. Reported on 10/03/2015    . Magnesium 250 MG TABS Take 1 tablet by mouth every evening.     . Multiple Vitamins-Minerals (MULTIVITAMIN WITH MINERALS) tablet Take 1 tablet by mouth every morning.     Marland Kitchen PROCTOZONE-HC 2.5 % rectal cream Place 1 application rectally as needed. Reported on 10/03/2015    . simvastatin (ZOCOR) 20 MG tablet TAKE 1 TABLET (20 MG TOTAL) BY MOUTH AT BEDTIME. 90 tablet 3  . vitamin E 400 UNIT capsule Take 400 Units by mouth every morning.     . Flaxseed, Linseed, (FLAX SEED OIL PO) Take 1,000 Units by mouth every evening. Reported on 10/03/2015     No current facility-administered medications for this encounter.     Physical Findings: The patient is in no acute distress. Patient is alert and oriented.  height is 5\' 3"  (1.6 m) and weight is 123 lb (55.8 kg). Her oral temperature is 98.5 F (36.9 C). Her blood pressure is 157/93 (abnormal) and her pulse is 82. Her oxygen saturation is 99%.  Lungs are clear to auscultation bilaterally. Heart has regular rate and rhythm. No  palpable cervical, supraclavicular, or axillary adenopathy. Abdomen is soft, non-tender, and non-distended. No hepatosplenomegaly.  No inguinal adenopathy. External genitalia unremarkable.     Pelvic Exam: Mild radiation changes in the proximal vagina. No mucosal lesions. The vaginal mucosa is atrophic. No pelvic masses on bimanual exam. Rectal exam was not performed in light of patient's discomfort from hemorrhoids.    Lab Findings: Lab Results  Component Value Date   WBC 4.1 03/06/2016   HGB 14.2 03/06/2016   HCT 41.5 03/06/2016   MCV 89.8 03/06/2016   PLT 243 03/06/2016    Radiographic Findings: CT scan of the abdomen and  pelvis in December 2016 showing no evidence of recurrence  Impression: 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 No evidence of recurrence on clinical exam today.   Plan: She will follow up with [name] in 3 months. Follow up with me in 6 months.  ____________________________________  Blair Promise, PhD, MD  This document serves as a record of services personally performed by Gery Pray, MD. It was created on his behalf by Bethann Humble, a trained medical scribe. The creation of this record is based on the scribe's personal observations and the provider's statements to them. This document has been checked and approved by the attending provider.

## 2016-11-30 ENCOUNTER — Telehealth: Payer: Self-pay | Admitting: *Deleted

## 2016-11-30 NOTE — Telephone Encounter (Signed)
Patient called and scheduled her follow up appt for August 22nd at 10:30 am; arrive at 10:15am.

## 2017-01-21 DIAGNOSIS — R69 Illness, unspecified: Secondary | ICD-10-CM | POA: Diagnosis not present

## 2017-02-16 NOTE — Progress Notes (Signed)
Consult Note: Gyn-Onc  Annette Tucker 71 y.o. female  CC:  Chief Complaint  Patient presents with  . Follow-up    HPI: Patient was initially seen in consultation at the request of Dr. Pamala Hurry.  Patient is a 71 year old gravida 0 who began experiencing some low back pain and then spotting starting in August.. She had an exam which showed significant atrophy and hormone cream was prescribed. She then continued to have some spotting and discharge went to see Dr. Pamala Hurry. Ultrasound was performed that revealed a 4 x 3 x 2 centimeter uterus with a 4.8 milliliter endometrial stripe. There were 2 small lesions measuring 0.5 cm each consistent with polyps. She had been on hormone replacement therapy until 2012. She had been on it approximately 10 years. Pathology from her D&C revealed high-grade endometrial cancer with serous features. For this reason that she is referred to [place] today.  Her primary physician is Dr. Tedra Senegal.  06/05/14: Surgery: Total robotic hysterectomy bilateral salpingo-oophorectomy, bilateral pelvic and para-aortic lymph node dissection.   Operative findings: Normal uterus, cervix, adnexal and abdominal survey, No pathologically enlarged nodes identified.   Pathology:  Diagnosis 1. Lymph node, biopsy, right para aortic nodes - ONE LYMPH NODE, NEGATIVE FOR METASTATIC CARCINOMA (0/1). 2. Lymph node, biopsy, left para aortic node - THREE LYMPH NODES, NEGATIVE FOR METASTATIC CARCINOMA (0/3). 3. Lymph nodes, regional resection, right pelvic - FOUR LYMPH NODES, NEGATIVE FOR METASTATIC CARCINOMA (0/4). 4. Lymph nodes, regional resection, left pelvic - FOUR LYMPH NODES, NEGATIVE FOR METASTATIC CARCINOMA (0/4). 5. Uterus +/- tubes/ovaries, neoplastic, with cervix - SUPERFICIALLY INVASIVE SEROUS CARCINOMA, 0.5 CM, CONFINED WITHIN THE INNER HALF OF THE MYOMETRIUM. (20%, no LVIS) - ADJACENT BENIGN ENDOMETRIAL POLYPS WITH NO EVIDENCE OF ATYPIA OR MALIGNANCY. - CERVIX: BENIGN  SQUAMOUS MUCOSA AND ENDOCERVICAL MUCOSA, NO DYSPLASIA OR MALIGNANCY. - BILATERAL OVARIES: BENIGN OVARIAN TISSUE WITH ENDOMETRIOSIS AND ENDOSALPINGIOSIS, NO ATYPIA OR MALIGNANCY. - BILATERAL FALLOPIAN TUBES: BENIGN FALLOPIAN TUBAL TISSUE, NO EVIDENCE OF ATYPIA OR MALIGNANCY. - MYOMETRIUM: LEIOMYOMATA.  We saw her for her postoperative check in the latter part of January 2016. Since that time she's completed 6 cycles of dose dense paclitaxel and carboplatin. She completed day #8 of cycle #6 on Nov 01, 2014. In addition, she's completed her high-dose vaginal cuff brachytherapy with Dr. Sondra Come.  She had a posttreatment CT scan that revealed (12/27/14): FINDINGS: Lower chest: Lung bases are clear.  Hepatobiliary: No focal hepatic lesion. No biliary duct dilatation.Gallbladder is normal. Common bile duct is normal.  Pancreas: Pancreas is normal. No ductal dilatation. No pancreatic inflammation.  Spleen: Linear hypodense band within the mid spleen extending from the hilum to the capsule (image 12, series 2) most suggestive of a splenic infarction. Splenic laceration could have a similar pattern. No evidence of acute injury.  Adrenals/urinary tract: Adrenal glands are normal. There is a nonenhancing cyst of the left kidney. Ureters are normal. The bladder is normal.  Stomach/Bowel: Stomach, small-bowel, and cecum normal. The colon and rectosigmoid colon normal. There is a large stool ball in the rectum measuring 7 cm in diameter.  Vascular/Lymphatic: Abdominal aorta is normal caliber. There is minimal of vascular intimal calcification. Splenic artery appears normal.  There is no retroperitoneal lymphadenopathy. No periportal adenopathy. No mesenteric adenopathy. No pelvic lymphadenopathy.  Reproductive: Post hysterectomy anatomy. No adnexal abnormality.  Musculoskeletal: No aggressive osseous lesion.  Other: No free fluid.  IMPRESSION: 1. No evidence of endometrial carcinoma  recurrence within the abdomen or pelvis. 2. New linear band  within the spleen is most suggestive of a splenic infarction. 3. Probable benign left renal cyst. 4. Large stool ball in the rectum.   She had a follow-up CT scan 06/26/2015. It showed no evidence of metastatic disease. She had resolution of the splenic infarct.   She is up-to-date on her mammograms and her bone densities.  Interval History: I last saw her in 2/18 at which time her exam and pap smear were negative. She saw Dr. Sondra Come with a negative exam in 5/18. She had her colonoscopy in 2017 and is not due for another 10 years. She had a wonderful trip to the [place] visiting [place], [place] common [place]. She is planning her next trip. Review of systems is completely negative. She continues to use her vaginal dilator 3 times a week on a Monday Wednesday Friday schedule. She is walking about 1-1/2-2 miles every day on her treadmill.  Review of Systems  Constitutional: Denies fever. Skin: No rash Cardiovascular: No chest pain, shortness of breath, or edema  Pulmonary: No cough   Gastro Intestinal: No nausea, vomiting, constipation, or diarrhea reported. No bright red blood per rectum or change in bowel movement.  Genitourinary: Denies vaginal bleeding and discharge.  Musculoskeletal:  +LBP, no change, chronic Neurologic: No neuropathy Psychology: No changes   Current Meds:  Outpatient Encounter Prescriptions as of 02/17/2017  Medication Sig  . acetaminophen (TYLENOL) 500 MG tablet Take 500 mg by mouth once as needed for moderate pain.   Marland Kitchen aspirin 81 MG EC tablet Take 81 mg by mouth every evening.   . Calcium Carbonate-Vitamin D (CALCIUM 600+D) 600-400 MG-UNIT per tablet Take 1 tablet by mouth 2 (two) times daily. @lunch  and in the evening  . cholecalciferol (VITAMIN D) 1000 UNITS tablet Take 1,000 Units by mouth daily.   . Coenzyme Q10 (CO Q-10) 100 MG CAPS Take 1 capsule by mouth every morning.   . fish oil-omega-3  fatty acids 1000 MG capsule Take 1 g by mouth every morning.   . Flaxseed, Linseed, (FLAX SEED OIL PO) Take 1,000 Units by mouth every evening. Reported on 10/03/2015  . ibuprofen (ADVIL,MOTRIN) 200 MG tablet Take 200 mg by mouth every 6 (six) hours as needed.  . loratadine (CLARITIN) 10 MG tablet Take 10 mg by mouth daily as needed for allergies. Reported on 10/03/2015  . Magnesium 250 MG TABS Take 1 tablet by mouth every evening.   . Multiple Vitamins-Minerals (MULTIVITAMIN WITH MINERALS) tablet Take 1 tablet by mouth every morning.   Marland Kitchen PROCTOZONE-HC 2.5 % rectal cream Place 1 application rectally as needed. Reported on 10/03/2015  . simvastatin (ZOCOR) 20 MG tablet TAKE 1 TABLET (20 MG TOTAL) BY MOUTH AT BEDTIME.  . vitamin E 400 UNIT capsule Take 400 Units by mouth every morning.    No facility-administered encounter medications on file as of 02/17/2017.     Allergy:  Allergies  Allergen Reactions  . Latex Rash    Social Hx:   Social History   Social History  . Marital status: Single    Spouse name: N/A  . Number of children: 0  . Years of education: N/A   Occupational History  . retired Scientist, research (medical)     Social History Main Topics  . Smoking status: Never Smoker  . Smokeless tobacco: Never Used  . Alcohol use Yes     Comment: rarely  . Drug use: No  . Sexual activity: No   Other Topics Concern  . Not on file  Social History Narrative  . No narrative on file    Past Surgical Hx:  Past Surgical History:  Procedure Laterality Date  . BREAST SURGERY     left breast bx - benign  . COLONOSCOPY  2006  . DILATATION & CURETTAGE/HYSTEROSCOPY WITH TRUECLEAR N/A 05/17/2014   Procedure: HYSTEROSCOPY WITH POLYPECTOMY;  Surgeon: Claiborne Billings A. Pamala Hurry, MD;  Location: [facility];  Service: Gynecology;  Laterality: N/A;  . LYMPH NODE DISSECTION N/A 06/05/2014   Procedure: LYMPH NODE DISSECTION;  Surgeon: Imagene Gurney A. Alycia Rossetti, MD;  Location: [facility];  Service: Gynecology;  Laterality: N/A;  . ROBOTIC  ASSISTED TOTAL HYSTERECTOMY WITH BILATERAL SALPINGO OOPHERECTOMY N/A 06/05/2014   Procedure: ROBOTIC ASSISTED TOTAL HYSTERECTOMY WITH BILATERAL SALPINGO OOPHORECTOMY;  Surgeon: Imagene Gurney A. Alycia Rossetti, MD;  Location: [facility];  Service: Gynecology;  Laterality: N/A;  . tmj mandibular advancement    . TONSILLECTOMY     age 20  . WISDOM TOOTH EXTRACTION      Past Medical Hx:  Past Medical History:  Diagnosis Date  . Cancer [facility])    ENDOMETRIAL CANCER  . Heart murmur    as child, no problems   . Hyperlipidemia   . Hypertension   . Radiation 08/21/14, 08/28/14, 09/04/14, 09/06/14, 09/18/14   vaginal cuff brachytherapy 30 gray  . Seasonal allergies     Oncology Hx:    Endometrial ca ([place])   05/17/2014 Initial Diagnosis    Endometrial ca      06/05/2014 Surgery    TRH/BSO and staging. IAUPSC 0/12 nodes, no LVSI      07/12/2014 -  Chemotherapy    paclitaxel and carboplatin      08/21/2014 - 09/18/2014 Radiation Therapy          Uterine cancer ([facility])   05/17/2014 Initial Diagnosis    Uterine cancer       - 11/01/2014 Chemotherapy    Completed 6 cycles of paclitaxel and carboplatin based chemotherapy      06/05/2014 Surgery    IA UPSC       Radiation Therapy          Family Hx:  Family History  Problem Relation Age of Onset  . Mental retardation Mother   . Stroke Mother   . Kidney disease Mother     Vitals:  Blood pressure (!) 145/77, pulse 77, temperature (!) 97.5 F (36.4 C), temperature source Oral, resp. rate 20, weight 123 lb 4.8 oz (55.9 kg), SpO2 99 %.  Physical Exam: Well-nourished well-developed female in no acute distress.  Neck: Supple, no lymphadenopathy, no thyromegaly.  Lungs: Clear to auscultation bilateral.  Cardiac: Regular rate and rhythm  Abdomen: Soft, nontender, nondistended. There are no palpable masses or hepatosplenomegaly. Well-healed surgical incisions with no evidence of any incisional hernias.  Extremities: No edema.  Groins: No  lymphadenopathy.  Pelvic: Normal female genitalia. The vagina is markedly atrophic. The vaginal cuff is without lesions. Bimanual examination reveals no tenderness, fluctuance or masses. 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 Her preoperative staging was negative. Her surgical staging was similarly negative. She's been treated adjuvantly with dose dense paclitaxel and carboplatin. She's completed all of her therapy with her last cycle of chemotherapy being Nov 01, 2014. She's had a negative post-treatment CT scan. We reviewed signs and symptoms of recurrent endometrial cancer.  She will follow-up with Dr. Sondra Come in 3 months. At that time she will be 3 years out  and we will go to every 6 months visits. Next pap smear will be done when she returns to see [place].  Marland KitchenGEHRIG,Keundra Petrucelli A., MD 02/17/2017, 11:06 AM

## 2017-02-17 ENCOUNTER — Encounter: Payer: Self-pay | Admitting: Gynecologic Oncology

## 2017-02-17 ENCOUNTER — Ambulatory Visit: Payer: Medicare HMO | Attending: Gynecologic Oncology | Admitting: Gynecologic Oncology

## 2017-02-17 VITALS — BP 145/77 | HR 77 | Temp 97.5°F | Resp 20 | Wt 123.3 lb

## 2017-02-17 DIAGNOSIS — Z8542 Personal history of malignant neoplasm of other parts of uterus: Secondary | ICD-10-CM | POA: Diagnosis not present

## 2017-02-17 DIAGNOSIS — Z90710 Acquired absence of both cervix and uterus: Secondary | ICD-10-CM

## 2017-02-17 DIAGNOSIS — Z09 Encounter for follow-up examination after completed treatment for conditions other than malignant neoplasm: Secondary | ICD-10-CM | POA: Diagnosis not present

## 2017-02-17 DIAGNOSIS — I10 Essential (primary) hypertension: Secondary | ICD-10-CM | POA: Insufficient documentation

## 2017-02-17 DIAGNOSIS — Z9079 Acquired absence of other genital organ(s): Secondary | ICD-10-CM | POA: Insufficient documentation

## 2017-02-17 DIAGNOSIS — Z7982 Long term (current) use of aspirin: Secondary | ICD-10-CM | POA: Insufficient documentation

## 2017-02-17 DIAGNOSIS — E785 Hyperlipidemia, unspecified: Secondary | ICD-10-CM | POA: Diagnosis not present

## 2017-02-17 DIAGNOSIS — C541 Malignant neoplasm of endometrium: Secondary | ICD-10-CM

## 2017-02-17 DIAGNOSIS — Z90722 Acquired absence of ovaries, bilateral: Secondary | ICD-10-CM | POA: Insufficient documentation

## 2017-02-17 NOTE — Patient Instructions (Signed)
Follow-up with Dr. Sondra Come in 3 months. At that time, it will be your 3 year anniversary. Congratulations!. We will then go to every 6 month visits. Her next visit with [place] will be in May 2019. Please call us in the spring to schedule that appointment.

## 2017-03-04 ENCOUNTER — Other Ambulatory Visit: Payer: Medicare HMO | Admitting: Internal Medicine

## 2017-03-04 DIAGNOSIS — D500 Iron deficiency anemia secondary to blood loss (chronic): Secondary | ICD-10-CM | POA: Diagnosis not present

## 2017-03-04 DIAGNOSIS — M1990 Unspecified osteoarthritis, unspecified site: Secondary | ICD-10-CM

## 2017-03-04 DIAGNOSIS — C541 Malignant neoplasm of endometrium: Secondary | ICD-10-CM | POA: Diagnosis not present

## 2017-03-04 DIAGNOSIS — E785 Hyperlipidemia, unspecified: Secondary | ICD-10-CM | POA: Diagnosis not present

## 2017-03-04 DIAGNOSIS — I10 Essential (primary) hypertension: Secondary | ICD-10-CM | POA: Diagnosis not present

## 2017-03-04 DIAGNOSIS — Z0000 Encounter for general adult medical examination without abnormal findings: Secondary | ICD-10-CM | POA: Diagnosis not present

## 2017-03-04 LAB — CBC WITH DIFFERENTIAL/PLATELET
Basophils Absolute: 29 cells/uL (ref 0–200)
Basophils Relative: 0.9 %
Eosinophils Absolute: 51 cells/uL (ref 15–500)
Eosinophils Relative: 1.6 %
HCT: 40.3 % (ref 35.0–45.0)
Hemoglobin: 13.8 g/dL (ref 11.7–15.5)
Lymphs Abs: 1066 cells/uL (ref 850–3900)
MCH: 30.6 pg (ref 27.0–33.0)
MCHC: 34.2 g/dL (ref 32.0–36.0)
MCV: 89.4 fL (ref 80.0–100.0)
MPV: 10.9 fL (ref 7.5–12.5)
Monocytes Relative: 12.1 %
Neutro Abs: 1667 cells/uL (ref 1500–7800)
Neutrophils Relative %: 52.1 %
Platelets: 215 10*3/uL (ref 140–400)
RBC: 4.51 10*6/uL (ref 3.80–5.10)
RDW: 13 % (ref 11.0–15.0)
Total Lymphocyte: 33.3 %
WBC mixed population: 387 cells/uL (ref 200–950)
WBC: 3.2 10*3/uL — ABNORMAL LOW (ref 3.8–10.8)

## 2017-03-04 LAB — COMPLETE METABOLIC PANEL WITH GFR
AG Ratio: 2 (calc) (ref 1.0–2.5)
ALT: 15 U/L (ref 6–29)
AST: 18 U/L (ref 10–35)
Albumin: 4.7 g/dL (ref 3.6–5.1)
Alkaline phosphatase (APISO): 71 U/L (ref 33–130)
BUN: 14 mg/dL (ref 7–25)
CO2: 27 mmol/L (ref 20–32)
Calcium: 9.6 mg/dL (ref 8.6–10.4)
Chloride: 101 mmol/L (ref 98–110)
Creat: 0.7 mg/dL (ref 0.50–0.99)
GFR, Est African American: 103 mL/min/{1.73_m2} (ref >=60)
GFR, Est Non African American: 89 mL/min/{1.73_m2} (ref >=60)
Globulin: 2.4 g/dL (calc) (ref 1.9–3.7)
Glucose, Bld: 89 mg/dL (ref 65–99)
Potassium: 4.3 mmol/L (ref 3.5–5.3)
Sodium: 137 mmol/L (ref 135–146)
Total Bilirubin: 0.6 mg/dL (ref 0.2–1.2)
Total Protein: 7.1 g/dL (ref 6.1–8.1)

## 2017-03-04 LAB — LIPID PANEL
Cholesterol: 170 mg/dL (ref <200)
HDL: 83 mg/dL (ref >50)
LDL Cholesterol (Calc): 71 mg/dL (calc)
Non-HDL Cholesterol (Calc): 87 mg/dL (calc) (ref <130)
Total CHOL/HDL Ratio: 2 (calc) (ref <5.0)
Triglycerides: 78 mg/dL (ref <150)

## 2017-03-04 LAB — TSH: TSH: 2.02 mIU/L (ref 0.40–4.50)

## 2017-03-09 ENCOUNTER — Other Ambulatory Visit: Payer: Medicare HMO | Admitting: Internal Medicine

## 2017-03-11 ENCOUNTER — Encounter: Payer: Self-pay | Admitting: Internal Medicine

## 2017-03-11 ENCOUNTER — Ambulatory Visit (INDEPENDENT_AMBULATORY_CARE_PROVIDER_SITE_OTHER): Payer: Medicare HMO | Admitting: Internal Medicine

## 2017-03-11 VITALS — BP 122/80 | HR 78 | Temp 97.9°F | Ht 63.0 in | Wt 123.0 lb

## 2017-03-11 DIAGNOSIS — E784 Other hyperlipidemia: Secondary | ICD-10-CM | POA: Diagnosis not present

## 2017-03-11 DIAGNOSIS — D708 Other neutropenia: Secondary | ICD-10-CM | POA: Diagnosis not present

## 2017-03-11 DIAGNOSIS — C541 Malignant neoplasm of endometrium: Secondary | ICD-10-CM

## 2017-03-11 DIAGNOSIS — G620 Drug-induced polyneuropathy: Secondary | ICD-10-CM | POA: Diagnosis not present

## 2017-03-11 DIAGNOSIS — Z0000 Encounter for general adult medical examination without abnormal findings: Secondary | ICD-10-CM | POA: Diagnosis not present

## 2017-03-11 DIAGNOSIS — T451X5A Adverse effect of antineoplastic and immunosuppressive drugs, initial encounter: Secondary | ICD-10-CM | POA: Diagnosis not present

## 2017-03-11 DIAGNOSIS — M8580 Other specified disorders of bone density and structure, unspecified site: Secondary | ICD-10-CM

## 2017-03-11 DIAGNOSIS — E7849 Other hyperlipidemia: Secondary | ICD-10-CM

## 2017-03-11 LAB — POCT URINALYSIS DIPSTICK
Bilirubin, UA: NEGATIVE
Blood, UA: NEGATIVE
Glucose, UA: NEGATIVE
Ketones, UA: NEGATIVE
Leukocytes, UA: NEGATIVE
Nitrite, UA: NEGATIVE
Protein, UA: NEGATIVE
Spec Grav, UA: 1.01 (ref 1.010–1.025)
Urobilinogen, UA: 0.2 E.U./dL
pH, UA: 7 (ref 5.0–8.0)

## 2017-03-11 NOTE — Progress Notes (Signed)
Subjective:    Patient ID: Annette Tucker, female    DOB: [birth year], 71 y.o.   MRN: [id number]  HPI  71 year old Female for health maintenance exam,Medicare wellness, and evaluation of medical issues.  In 2015 she noticed vaginal spotting. She had a D&C that revealed endometrial cancer and was referred to GYN oncologist. Subsequently underwent robotic-assisted TAH/BSO with lymph node dissection. Lymph nodes were negative. Stage was 180. She had a high-grade serous endometrial carcinoma. She received radiation and chemotherapy. Chemotherapy was with Taxol and carboplatin. She continues to be followed by GYN oncology. She is doing well.  History of osteopenia and osteoarthritis.  History of hypertension and hyperlipidemia both of which were well controlled.  Was diagnosed by Dr. Aldona Bar with mitral valve prolapse in 2001 on echocardiogram.  She takes vitamin D supplement.  Had Zostavax vaccine September 2011, tonsillectomy at age 54, TMJ mandibular advancement surgery 1986, colonoscopy September 2006 and September 2016. Tetanus immunization is up-to-date. Mammogram due late September.  She took Fosamax for a couple of years for osteopenia. No recent bone density scan.  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 She is a native of [place]. She does not smoke or consume alcohol. She is single.  Family history: Mother with history of Alzheimer's dementia. Father died at age 32 with kidney disease and history of MI, hypertension, CVA. One brother with hyperlipidemia.      Review of Systems no new complaints or problems.     Objective:   Physical Exam  Constitutional: She is oriented to person, place, and time. She appears well-developed and well-nourished. No distress.  HENT:  Head: Normocephalic and atraumatic.  Right Ear: External ear normal.  Left Ear:  External ear normal.  Mouth/Throat: Oropharynx is clear and moist.  Eyes: Pupils are equal, round, and reactive to light. Conjunctivae and EOM are normal. Right eye exhibits no discharge. Left eye exhibits no discharge. No scleral icterus.  Neck: Neck supple. No JVD present. No thyromegaly present.  Cardiovascular: Normal rate, regular rhythm and normal heart sounds.   No murmur heard. Pulmonary/Chest: Effort normal and breath sounds normal. No respiratory distress. She has no wheezes. She has no rales.  Breasts normal female  Abdominal: Soft. Bowel sounds are normal. There is no tenderness. There is no rebound and no guarding.  Genitourinary:  Genitourinary Comments: Deferred deferred status post robotic TAH/BSO  Musculoskeletal: She exhibits no edema.  Lymphadenopathy:    She has no cervical adenopathy.  Neurological: She is alert and oriented to person, place, and time. She has normal reflexes. No cranial nerve deficit. Coordination normal.  Skin: Skin is warm and dry. No rash noted. She is not diaphoretic.  Psychiatric: She has a normal mood and affect. Her behavior is normal. Judgment and thought content normal.  Vitals reviewed.         Assessment & Plan:  Stage IA endometrial cancer status post robotic-assisted TAH/BSO with lymph node dissection and doing well status post chemotherapy and radiation therapy.  Osteopenia  Osteoarthritis  History of essential hypertension  Hyperlipidemia on statin medication  Diagnosed with mitral valve prolapse in 2001 on echocardiogram  She has some neuropathy in her feet perhaps related to team a therapy. Labs reviewed she has a history of of low white blood cell count. This is 3200 and was 3300 when checked in 2016. In  September 2017 it was 4100. And has been as high as 5100 in January 2017.  Patient will have CBC repeated in late October with peripheral review of smear. No office visit will be booked at that time unless results warrant  that. She has a history of hypertension and used to take antihypertensive medication but currently is not taking that. Continue to monitor blood pressure at home which she says is been fine. Blood pressure today is excellent at 122/80. BMI is excellent.  Subjective:   Patient presents for Medicare Annual/Subsequent preventive examination.  Review Past Medical/Family/Social:See above   Risk Factors  Current exercise habits: Exercises regularly Dietary issues discussed: Low fat low car  Cardiac risk factors: Hyperlipidemia and family history  Depression Screen  (Note: if answer to either of the following is "Yes", a more complete depression screening is indicated)   Over the past two weeks, have you felt down, depressed or hopeless? No  Over the past two weeks, have you felt little interest or pleasure in doing things? No Have you lost interest or pleasure in daily life? No Do you often feel hopeless? No Do you cry easily over simple problems? No   Activities of Daily Living  In your present state of health, do you have any difficulty performing the following activities?:   Driving? No  Managing money? No  Feeding yourself? No  Getting from bed to chair? No  Climbing a flight of stairs? No  Preparing food and eating?: No  Bathing or showering? No  Getting dressed: No  Getting to the toilet? No  Using the toilet:No  Moving around from place to place: No  In the past year have you fallen or had a near fall?:No  Are you sexually active? No  Do you have more than one partner? No   Hearing Difficulties: No  Do you often ask people to speak up or repeat themselves? No  Do you experience ringing or noises in your ears? No  Do you have difficulty understanding soft or whispered voices? No  Do you feel that you have a problem with memory? No Do you often misplace items? No    Home Safety:  Do you have a smoke alarm at your residence? Yes Do you have grab bars in the  bathroom?No Do you have throw rugs in your house?Yes   Cognitive Testing  Alert? Yes Normal Appearance?Yes  Oriented to person? Yes Place? Yes  Time? Yes  Recall of three objects? Yes  Can perform simple calculations? Yes  Displays appropriate judgment?Yes  Can read the correct time from a watch face?Yes   List the Names of Other Physician/Practitioners you currently use:  See referral list for the physicians patient is currently seeing.     Review of Systems: See above   Objective:     General appearance: AppearsYounger than stated age Head: Normocephalic, without obvious abnormality, atraumatic  Eyes: conj clear, EOMi PEERLA  Ears: normal TM's and external ear canals both ears  Nose: Nares normal. Septum midline. Mucosa normal. No drainage or sinus tenderness.  Throat: lips, mucosa, and tongue normal; teeth and gums normal  Neck: no adenopathy, no carotid bruit, no JVD, supple, symmetrical, trachea midline and thyroid not enlarged, symmetric, no tenderness/mass/nodules  No CVA tenderness.  Lungs: clear to auscultation bilaterally  Breasts: normal appearance, no masses or tenderness Heart: regular rate and rhythm, S1, S2 normal, no murmur, click, rub or gallop  Abdomen: soft, non-tender; bowel sounds normal; no masses, no organomegaly  Musculoskeletal: ROM normal in all joints, no crepitus, no deformity, Normal muscle strengthen. Back  is symmetric, no curvature. Skin: Skin color, texture, turgor normal. No rashes or lesions  Lymph nodes: Cervical, supraclavicular, and axillary nodes normal.  Neurologic: CN 2 -12 Normal, Normal symmetric reflexes. Normal coordination and gait  Psych: Alert & Oriented x 3, Mood appear stable.    Assessment:    Annual wellness medicare exam   Plan:    During the course of the visit the patient was educated and counseled about appropriate screening and preventive services including:   Annual mammogram  Bone density study  Annual  flu vaccine     Patient Instructions (the written plan) was given to the patient.  Medicare Attestation  I have personally reviewed:  The patient's medical and social history  Their use of alcohol, tobacco or illicit drugs  Their current medications and supplements  The patient's functional ability including ADLs,fall risks, home safety risks, cognitive, and hearing and visual impairment  Diet and physical activities  Evidence for depression or mood disorders  The patient's weight, height, BMI, and visual acuity have been recorded in the chart. I have made referrals, counseling, and provided education to the patient based on review of the above and I have provided the patient with a written personalized care plan for preventive services.

## 2017-03-19 DIAGNOSIS — H52223 Regular astigmatism, bilateral: Secondary | ICD-10-CM | POA: Diagnosis not present

## 2017-03-19 DIAGNOSIS — H524 Presbyopia: Secondary | ICD-10-CM | POA: Diagnosis not present

## 2017-03-19 DIAGNOSIS — H5213 Myopia, bilateral: Secondary | ICD-10-CM | POA: Diagnosis not present

## 2017-03-25 ENCOUNTER — Encounter: Payer: Self-pay | Admitting: Internal Medicine

## 2017-03-25 ENCOUNTER — Ambulatory Visit (INDEPENDENT_AMBULATORY_CARE_PROVIDER_SITE_OTHER): Payer: Medicare HMO | Admitting: Internal Medicine

## 2017-03-25 DIAGNOSIS — Z23 Encounter for immunization: Secondary | ICD-10-CM

## 2017-03-25 DIAGNOSIS — Z1231 Encounter for screening mammogram for malignant neoplasm of breast: Secondary | ICD-10-CM | POA: Diagnosis not present

## 2017-03-25 DIAGNOSIS — M85852 Other specified disorders of bone density and structure, left thigh: Secondary | ICD-10-CM | POA: Diagnosis not present

## 2017-03-25 NOTE — Progress Notes (Signed)
Flu vaccine given.

## 2017-03-25 NOTE — Patient Instructions (Signed)
Flu vaccine given.

## 2017-03-27 ENCOUNTER — Encounter: Payer: Self-pay | Admitting: Internal Medicine

## 2017-03-27 NOTE — Patient Instructions (Addendum)
It was a pleasure to see you today. Continue same medications. Return late October for repeat CBC and peripheral review of smear.

## 2017-04-22 ENCOUNTER — Other Ambulatory Visit: Payer: Medicare HMO | Admitting: Internal Medicine

## 2017-04-22 DIAGNOSIS — D72819 Decreased white blood cell count, unspecified: Secondary | ICD-10-CM | POA: Diagnosis not present

## 2017-04-23 LAB — CBC WITH DIFFERENTIAL/PLATELET
Basophils Absolute: 49 cells/uL (ref 0–200)
Basophils Relative: 1.3 %
Eosinophils Absolute: 87 cells/uL (ref 15–500)
Eosinophils Relative: 2.3 %
HCT: 38 % (ref 35.0–45.0)
Hemoglobin: 13.2 g/dL (ref 11.7–15.5)
Lymphs Abs: 1129 cells/uL (ref 850–3900)
MCH: 31.4 pg (ref 27.0–33.0)
MCHC: 34.7 g/dL (ref 32.0–36.0)
MCV: 90.3 fL (ref 80.0–100.0)
MPV: 11 fL (ref 7.5–12.5)
Monocytes Relative: 13.3 %
Neutro Abs: 2029 cells/uL (ref 1500–7800)
Neutrophils Relative %: 53.4 %
Platelets: 211 10*3/uL (ref 140–400)
RBC: 4.21 10*6/uL (ref 3.80–5.10)
RDW: 12.8 % (ref 11.0–15.0)
Total Lymphocyte: 29.7 %
WBC mixed population: 505 cells/uL (ref 200–950)
WBC: 3.8 10*3/uL (ref 3.8–10.8)

## 2017-04-23 LAB — PATHOLOGIST SMEAR REVIEW

## 2017-05-24 ENCOUNTER — Other Ambulatory Visit: Payer: Self-pay | Admitting: Internal Medicine

## 2017-05-27 ENCOUNTER — Other Ambulatory Visit: Payer: Self-pay

## 2017-05-27 ENCOUNTER — Ambulatory Visit
Admission: RE | Admit: 2017-05-27 | Discharge: 2017-05-27 | Disposition: A | Discharge status: Home / Self Care | Payer: Medicare HMO | Source: Ambulatory Visit | Attending: Radiation Oncology | Admitting: Radiation Oncology

## 2017-05-27 ENCOUNTER — Encounter: Payer: Self-pay | Admitting: Radiation Oncology

## 2017-05-27 DIAGNOSIS — Z91040 Latex allergy status: Secondary | ICD-10-CM | POA: Diagnosis not present

## 2017-05-27 DIAGNOSIS — Z79899 Other long term (current) drug therapy: Secondary | ICD-10-CM | POA: Diagnosis not present

## 2017-05-27 DIAGNOSIS — K5900 Constipation, unspecified: Secondary | ICD-10-CM | POA: Insufficient documentation

## 2017-05-27 DIAGNOSIS — Z7982 Long term (current) use of aspirin: Secondary | ICD-10-CM | POA: Diagnosis not present

## 2017-05-27 DIAGNOSIS — Z90710 Acquired absence of both cervix and uterus: Secondary | ICD-10-CM | POA: Diagnosis not present

## 2017-05-27 DIAGNOSIS — Z8542 Personal history of malignant neoplasm of other parts of uterus: Secondary | ICD-10-CM | POA: Diagnosis not present

## 2017-05-27 DIAGNOSIS — Z08 Encounter for follow-up examination after completed treatment for malignant neoplasm: Secondary | ICD-10-CM | POA: Diagnosis not present

## 2017-05-27 DIAGNOSIS — R102 Pelvic and perineal pain: Secondary | ICD-10-CM | POA: Diagnosis not present

## 2017-05-27 DIAGNOSIS — Z923 Personal history of irradiation: Secondary | ICD-10-CM | POA: Diagnosis not present

## 2017-05-27 DIAGNOSIS — M25551 Pain in right hip: Secondary | ICD-10-CM | POA: Diagnosis not present

## 2017-05-27 DIAGNOSIS — C541 Malignant neoplasm of endometrium: Secondary | ICD-10-CM

## 2017-05-27 DIAGNOSIS — Z9221 Personal history of antineoplastic chemotherapy: Secondary | ICD-10-CM | POA: Diagnosis not present

## 2017-05-27 DIAGNOSIS — Z90722 Acquired absence of ovaries, bilateral: Secondary | ICD-10-CM | POA: Insufficient documentation

## 2017-05-27 DIAGNOSIS — K649 Unspecified hemorrhoids: Secondary | ICD-10-CM | POA: Diagnosis not present

## 2017-05-27 NOTE — Progress Notes (Signed)
Radiation Oncology         [phone] ________________________________  Name: Annette Tucker MRN: [id number]  Date: 05/27/2017  DOB: 1945/09/11  Follow-Up Visit Note  CC: [name], MD  Gordy Levan, MD    ICD-10-CM   1. Endometrial ca [facility]) C54.1     Diagnosis:  FIGO stage IA high-grade endometrial serous carcinoma    Interval Since Last Radiation: 1 year 2 months  February 23, March 1, March 8, March 10, September 18, 2014  Site/dose:   Vaginal cuff 30 gray in 5 fractions  Narrative: Annette Tucker is here for a follow up. Pt states that she still uses vaginal dilator about 3 times a week. Pt inquired about how much longer she will have to use the vaginal dilator. She states that the lubricant she normally uses has recently become irritant. Pt states that she is sensitive to latex.Pt recently saw [name] and will follow up in 6 months. Pt denies any new medical problems, vaginal bleeding, rectal bleeding, pelvic pain, or hematuria.   ALLERGIES:  is allergic to latex.  Meds: Current Outpatient Medications  Medication Sig Dispense Refill  . aspirin 81 MG EC tablet Take 81 mg by mouth every evening.     . Calcium Carbonate-Vitamin D (CALCIUM 600+D) 600-400 MG-UNIT per tablet Take 1 tablet by mouth 2 (two) times daily. @lunch  and in the evening    . cholecalciferol (VITAMIN D) 1000 UNITS tablet Take 1,000 Units by mouth daily.     . Coenzyme Q10 (CO Q-10) 100 MG CAPS Take 1 capsule by mouth every morning.     . fish oil-omega-3 fatty acids 1000 MG capsule Take 1 g by mouth every morning.     . Magnesium 250 MG TABS Take 1 tablet by mouth every evening.     . Multiple Vitamins-Minerals (MULTIVITAMIN WITH MINERALS) tablet Take 1 tablet by mouth every morning.     . simvastatin (ZOCOR) 20 MG tablet TAKE 1 TABLET BY MOUTH AT BEDTIME 90 tablet 3  . vitamin E 400 UNIT capsule Take 400 Units by mouth every morning.     Marland Kitchen PROCTOZONE-HC 2.5 % rectal cream Place 1 application  rectally as needed. Reported on 10/03/2015     No current facility-administered medications for this encounter.     Physical Findings: The patient is in no acute distress. Patient is alert and oriented.  height is 5\' 3"  (1.6 m) and weight is 125 lb 3.2 oz (56.8 kg). Her oral temperature is 98.7 F (37.1 C). Her blood pressure is 157/92 (abnormal) and her pulse is 78. Her oxygen saturation is 100%.  Lungs are clear to auscultation bilaterally. Heart has regular rate and rhythm. No palpable cervical, supraclavicular, or axillary adenopathy. Abdomen is soft, non-tender, and non-distended. No hepatosplenomegaly.  No inguinal adenopathy. External genitalia unremarkable.     Pelvic Exam: Mild radiation changes in the proximal vagina. No mucosal lesions. The vaginal mucosa is atrophic. No pelvic masses on bimanual exam and Rectal exam.    Lab Findings: Lab Results  Component Value Date   WBC 3.8 04/22/2017   HGB 13.2 04/22/2017   HCT 38.0 04/22/2017   MCV 90.3 04/22/2017   PLT 211 04/22/2017    Radiographic Findings: CT scan of the abdomen and pelvis in December 2016 showing no evidence of recurrence  Impression: No evidence of recurrence on clinical exam today.   Plan: She will follow up with [name] in 6 months for exam and pap  smear. Follow up with me in 1 year since she is now 3 years out from her treatment.   ____________________________________  Blair Promise, PhD, MD  This document serves as a record of services personally performed by Gery Pray, MD. It was created on his behalf by Bethann Humble, a trained medical scribe. The creation of this record is based on the scribe's personal observations and the provider's statements to them. This document has been checked and approved by the attending provider.

## 2017-05-27 NOTE — Progress Notes (Signed)
Annette Tucker is here for follow up.  She denies having pain, bladder/bowel issues or vaginal bleeding/discharge.  She is using a vaginal dilator 3 times a week and is wondering how much longer to continue using it.  She denies having fatigue.  BP (!) 157/92 (BP Location: Left Arm, Patient Position: Sitting)   Pulse 78   Temp 98.7 F (37.1 C) (Oral)   Ht 5\' 3"  (1.6 m)   Wt 125 lb 3.2 oz (56.8 kg)   SpO2 100%   BMI 22.18 kg/m    Wt Readings from Last 3 Encounters:  05/27/17 125 lb 3.2 oz (56.8 kg)  03/11/17 123 lb (55.8 kg)  02/17/17 123 lb 4.8 oz (55.9 kg)

## 2017-07-26 ENCOUNTER — Telehealth: Payer: Self-pay | Admitting: *Deleted

## 2017-07-26 NOTE — Telephone Encounter (Signed)
Patient called and scheduled appt with Dr. Denman George. Patient made aware that [name] will no longer be at this office.

## 2017-07-29 DIAGNOSIS — R69 Illness, unspecified: Secondary | ICD-10-CM | POA: Diagnosis not present

## 2017-09-02 ENCOUNTER — Encounter (INDEPENDENT_AMBULATORY_CARE_PROVIDER_SITE_OTHER): Payer: Self-pay | Admitting: Orthopaedic Surgery

## 2017-09-02 ENCOUNTER — Ambulatory Visit (INDEPENDENT_AMBULATORY_CARE_PROVIDER_SITE_OTHER): Payer: Medicare HMO | Admitting: Orthopaedic Surgery

## 2017-09-02 ENCOUNTER — Ambulatory Visit (INDEPENDENT_AMBULATORY_CARE_PROVIDER_SITE_OTHER): Payer: Medicare HMO

## 2017-09-02 VITALS — BP 158/93 | HR 84 | Ht 63.0 in | Wt 120.0 lb

## 2017-09-02 DIAGNOSIS — M79671 Pain in right foot: Secondary | ICD-10-CM | POA: Diagnosis not present

## 2017-09-02 NOTE — Progress Notes (Signed)
Office Visit Note   Patient: Annette Tucker           Date of Birth: June 03, 1946           MRN: [id number] Visit Date: 09/02/2017              Requested by: [name], MD [address] [place], [place] [id number] PCP: [name], MD   Assessment & Plan: Visit Diagnoses:  1. Pain in right foot     Plan: Stress fracture of either to the right second or third distal metatarsal. 2 new with wooden shoe. Office 1 month if no improvement. Avoid weightbearing exercises and try either water aerobics or exercise bike. I believe this will just resolve on its own over the next 3-4 weeks  Follow-Up Instructions: Return if symptoms worsen or fail to improve.   Orders:  Orders Placed This Encounter  Procedures  . XR Foot Complete Right   No orders of the defined types were placed in this encounter.     Procedures: No procedures performed   Clinical Data: No additional findings.   Subjective: Chief Complaint  Patient presents with  . Right Foot - Pain, Injury    Annette Tucker IS 71 Y O F HERE FOR RIGHT FOOT PAIN. RAN ON TREADMLL, WENT TO BED AND WOKE UP IN PAIN. WENT AND GOT POST OP SHOE FOR RIGHT FOO. STILL NOT BETTER. PAIN ON TOP OF FOOT.  Rather acute onset of right foot pain about a month ago. Annette Tucker is been exercising on a treadmill. She's been wearing a wooden shoe that seems to make a big difference. No numbness or tingling. Pain is localized near the second and third metatarsal  HPI  Review of Systems  Constitutional: Negative for fatigue and fever.  HENT: Negative for ear pain and tinnitus.   Eyes: Negative for pain.  Respiratory: Negative for cough and shortness of breath.   Cardiovascular: Negative for chest pain, palpitations and leg swelling.  Gastrointestinal: Negative for blood in stool, constipation and diarrhea.  Genitourinary: Negative for dysuria.  Musculoskeletal: Negative for back pain and neck pain.  Skin: Negative for rash and wound.    Allergic/Immunologic: Negative for food allergies.  Neurological: Negative for dizziness, weakness, light-headedness, numbness and headaches.  Hematological: Does not bruise/bleed easily.  Psychiatric/Behavioral: Negative for sleep disturbance.     Objective: Vital Signs: BP (!) 158/93 (BP Location: Left Arm, Patient Position: Sitting, Cuff Size: Normal)   Pulse 84   Ht 5\' 3"  (1.6 m)   Wt 120 lb (54.4 kg)   BMI 21.26 kg/m   Physical Exam  Ortho Exam awake alert and oriented times comfortable sitting. Local tenderness and some swelling over the distal third portions of the second and third metatarsal. No pain over the first metatarsal right foot. No pain at the metatarsal phalangeal joints. No pain in the plantar aspect of the foot. Normal sensibility. Good capillary refill. No posterior heel pain of plantar heel pain or hindfoot discomfort.  Specialty Comments:  No specialty comments available.  Imaging: Xr Foot Complete Right  Result Date: 09/02/2017 Films of the right foot obtained in several projections. There is a suggestion of slight periosteal elevation of the distal third of the second metatarsal at the middle distal third junction. I think this is consistent with a stress fracture.    PMFS History: Patient Active Problem List   Diagnosis Date Noted  . Chemotherapy-induced peripheral neuropathy ([place]) 10/20/2014  . External hemorrhoid 10/20/2014  .  Latex allergy 07/27/2014  . Bladder dysfunction 07/27/2014  . Endometrial ca ([place]) 05/30/2014  . Back pain 09/01/2011  . Hyperlipidemia 02/27/2011  . Osteopenia 02/27/2011  . Mitral valve prolapse 02/27/2011  . Osteoarthritis 02/27/2011   Past Medical History:  Diagnosis Date  . Cancer [facility])    ENDOMETRIAL CANCER  . Heart murmur    as child, no problems   . Hyperlipidemia   . Hypertension   . Radiation 08/21/14, 08/28/14, 09/04/14, 09/06/14, 09/18/14   vaginal cuff brachytherapy 30 gray  . Seasonal allergies      Family History  Problem Relation Age of Onset  . Mental retardation Mother   . Kidney disease Father   . Stroke Father     Past Surgical History:  Procedure Laterality Date  . BREAST SURGERY     left breast bx - benign  . COLONOSCOPY  2006  . DILATATION & CURETTAGE/HYSTEROSCOPY WITH TRUECLEAR N/A 05/17/2014   Procedure: HYSTEROSCOPY WITH POLYPECTOMY;  Surgeon: Claiborne Billings A. Pamala Hurry, MD;  Location: [facility];  Service: Gynecology;  Laterality: N/A;  . LYMPH NODE DISSECTION N/A 06/05/2014   Procedure: LYMPH NODE DISSECTION;  Surgeon: Imagene Gurney A. Alycia Rossetti, MD;  Location: [facility];  Service: Gynecology;  Laterality: N/A;  . ROBOTIC ASSISTED TOTAL HYSTERECTOMY WITH BILATERAL SALPINGO OOPHERECTOMY N/A 06/05/2014   Procedure: ROBOTIC ASSISTED TOTAL HYSTERECTOMY WITH BILATERAL SALPINGO OOPHORECTOMY;  Surgeon: Imagene Gurney A. Alycia Rossetti, MD;  Location: [facility];  Service: Gynecology;  Laterality: N/A;  . tmj mandibular advancement    . TONSILLECTOMY     age 80  . WISDOM TOOTH EXTRACTION     Social History   Occupational History  . Occupation: retired Psychologist, prison and probation services  . Smoking status: Never Smoker  . Smokeless tobacco: Never Used  Substance and Sexual Activity  . Alcohol use: Yes    Comment: rarely  . Drug use: No  . Sexual activity: No    Birth control/protection: Post-menopausal

## 2017-09-02 NOTE — Progress Notes (Signed)
Bone density study reviewed from 2018.

## 2017-09-22 ENCOUNTER — Telehealth: Payer: Self-pay | Admitting: *Deleted

## 2017-09-22 NOTE — Telephone Encounter (Signed)
Called the patient and moved the appt form May 31st to May 28th with Dr. Gerarda Fraction

## 2017-11-23 ENCOUNTER — Encounter: Payer: Self-pay | Admitting: Obstetrics

## 2017-11-23 ENCOUNTER — Inpatient Hospital Stay: Payer: Medicare HMO

## 2017-11-23 ENCOUNTER — Inpatient Hospital Stay: Payer: Medicare HMO | Attending: Gynecologic Oncology | Admitting: Obstetrics

## 2017-11-23 ENCOUNTER — Other Ambulatory Visit (HOSPITAL_COMMUNITY)
Admission: RE | Admit: 2017-11-23 | Discharge: 2017-11-23 | Disposition: A | Discharge status: Home / Self Care | Payer: Medicare HMO | Source: Ambulatory Visit | Attending: Gynecologic Oncology | Admitting: Gynecologic Oncology

## 2017-11-23 VITALS — BP 143/93 | HR 81 | Temp 98.8°F | Resp 20 | Ht 63.0 in | Wt 126.2 lb

## 2017-11-23 DIAGNOSIS — Z9221 Personal history of antineoplastic chemotherapy: Secondary | ICD-10-CM

## 2017-11-23 DIAGNOSIS — Z08 Encounter for follow-up examination after completed treatment for malignant neoplasm: Secondary | ICD-10-CM | POA: Insufficient documentation

## 2017-11-23 DIAGNOSIS — Z923 Personal history of irradiation: Secondary | ICD-10-CM | POA: Diagnosis not present

## 2017-11-23 DIAGNOSIS — Z8542 Personal history of malignant neoplasm of other parts of uterus: Secondary | ICD-10-CM | POA: Insufficient documentation

## 2017-11-23 DIAGNOSIS — C541 Malignant neoplasm of endometrium: Secondary | ICD-10-CM | POA: Insufficient documentation

## 2017-11-23 DIAGNOSIS — Z90722 Acquired absence of ovaries, bilateral: Secondary | ICD-10-CM | POA: Diagnosis not present

## 2017-11-23 DIAGNOSIS — Z01419 Encounter for gynecological examination (general) (routine) without abnormal findings: Secondary | ICD-10-CM | POA: Diagnosis not present

## 2017-11-23 DIAGNOSIS — Z90710 Acquired absence of both cervix and uterus: Secondary | ICD-10-CM

## 2017-11-23 NOTE — Patient Instructions (Signed)
1. Pap was performed today and we will let you know those results 2. A CA125 will be ordered and we will let you know those results. 3. Return in 6 months for pelvic and CA125

## 2017-11-23 NOTE — Progress Notes (Signed)
Consult Note: Gyn-Onc  Annette Tucker 72 y.o. female  CC:  Chief Complaint  Patient presents with  . Endometrial ca [facility])    HPI: Patient was initially seen in consultation at the request of Dr. Pamala Hurry.  Patient is a 72 y.o. G0    Interval History: She was last seen by [name] August 2018 and then followed by Dr. Sondra Come in November 2018 both visits she was NED.  She presents as an established patient today, however new to me.  She is doing well has no complaints.  Specifically denies vaginal bleeding and pelvic pain.  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   She continues to use her vaginal dilator.    Oncologic History: She began experiencing some low back pain and then spotting starting in August.. She had an exam which showed significant atrophy and hormone cream was prescribed. She then continued to have some spotting and discharge went to see Dr. Pamala Hurry. Ultrasound was performed that revealed a 4 x 3 x 2 centimeter uterus with a 4.8 milliliter endometrial stripe. There were 2 small lesions measuring 0.5 cm each consistent with polyps. She had been on hormone replacement therapy until 2012. She had been on it approximately 10 years. Pathology from her D&C revealed high-grade endometrial cancer with serous features. For this reason that she wass referred to [place].  06/05/14: Surgery: Total robotic hysterectomy bilateral salpingo-oophorectomy, bilateral pelvic and para-aortic lymph node dissection.   Operative findings: Normal uterus, cervix, adnexal and abdominal survey, No pathologically enlarged nodes identified.   Pathology:  Diagnosis 1. Lymph node, biopsy, right para aortic nodes - ONE LYMPH NODE, NEGATIVE FOR METASTATIC CARCINOMA (0/1). 2. Lymph node, biopsy, left para aortic node - THREE LYMPH NODES, NEGATIVE FOR METASTATIC CARCINOMA (0/3). 3. Lymph nodes, regional  resection, right pelvic - FOUR LYMPH NODES, NEGATIVE FOR METASTATIC CARCINOMA (0/4). 4. Lymph nodes, regional resection, left pelvic - FOUR LYMPH NODES, NEGATIVE FOR METASTATIC CARCINOMA (0/4). 5. Uterus +/- tubes/ovaries, neoplastic, with cervix - SUPERFICIALLY INVASIVE SEROUS CARCINOMA, 0.5 CM, CONFINED WITHIN THE INNER HALF OF THE MYOMETRIUM. (20%, no LVIS) - ADJACENT BENIGN ENDOMETRIAL POLYPS WITH NO EVIDENCE OF ATYPIA OR MALIGNANCY. - CERVIX: BENIGN SQUAMOUS MUCOSA AND ENDOCERVICAL MUCOSA, NO DYSPLASIA OR MALIGNANCY. - BILATERAL OVARIES: BENIGN OVARIAN TISSUE WITH ENDOMETRIOSIS AND ENDOSALPINGIOSIS, NO ATYPIA OR MALIGNANCY. - BILATERAL FALLOPIAN TUBES: BENIGN FALLOPIAN TUBAL TISSUE, NO EVIDENCE OF ATYPIA OR MALIGNANCY. - MYOMETRIUM: LEIOMYOMATA.  We saw her for her postoperative check in the latter part of January 2016. Since that time she completed 6 cycles of dose dense paclitaxel and carboplatin. She completed day #8 of cycle #6 on Nov 01, 2014. In addition, she's completed her high-dose vaginal cuff brachytherapy with Dr. Sondra Come.  She had a posttreatment CT scan that revealed (12/27/14): FINDINGS: Lower chest: Lung bases are clear.  Hepatobiliary: No focal hepatic lesion. No biliary duct dilatation.Gallbladder is normal. Common bile duct is normal.  Pancreas: Pancreas is normal. No ductal dilatation. No pancreatic inflammation.  Spleen: Linear hypodense band within the mid spleen extending from the hilum to the capsule (image 12, series 2) most suggestive of a splenic infarction. Splenic laceration could have a similar pattern. No evidence of acute injury.  Adrenals/urinary tract: Adrenal glands are normal. There is a nonenhancing cyst of the left kidney. Ureters are normal. The bladder is normal.  Stomach/Bowel: Stomach, small-bowel, and cecum normal. The colon and  rectosigmoid colon normal. There is a large stool ball in the rectum measuring 7 cm in  diameter.  Vascular/Lymphatic: Abdominal aorta is normal caliber. There is minimal of vascular intimal calcification. Splenic artery appears normal.  There is no retroperitoneal lymphadenopathy. No periportal adenopathy. No mesenteric adenopathy. No pelvic lymphadenopathy.  Reproductive: Post hysterectomy anatomy. No adnexal abnormality.  Musculoskeletal: No aggressive osseous lesion.  Other: No free fluid.  IMPRESSION: 1. No evidence of endometrial carcinoma recurrence within the abdomen or pelvis. 2. New linear band within the spleen is most suggestive of a splenic infarction. 3. Probable benign left renal cyst. 4. Large stool ball in the rectum.   She had a follow-up CT scan 06/26/2015. It showed no evidence of metastatic disease. She had resolution of the splenic infarct.    Measurement of Disease: CA125 (Noted to be normal preop 05/30/2014 = 23)   Current Meds:  Outpatient Encounter Medications as of 11/23/2017  Medication Sig  . aspirin 81 MG EC tablet Take 81 mg by mouth every evening.   . Calcium Carbonate-Vitamin D (CALCIUM 600+D) 600-400 MG-UNIT per tablet Take 1 tablet by mouth 2 (two) times daily. @lunch  and in the evening  . cholecalciferol (VITAMIN D) 1000 UNITS tablet Take 1,000 Units by mouth daily.   . Coenzyme Q10 (CO Q-10) 100 MG CAPS Take 1 capsule by mouth every morning.   . fish oil-omega-3 fatty acids 1000 MG capsule Take 1 g by mouth every morning.   . Magnesium 250 MG TABS Take 1 tablet by mouth every evening.   . Multiple Vitamins-Minerals (MULTIVITAMIN WITH MINERALS) tablet Take 1 tablet by mouth every morning.   . simvastatin (ZOCOR) 20 MG tablet TAKE 1 TABLET BY MOUTH AT BEDTIME  . vitamin E 400 UNIT capsule Take 400 Units by mouth every morning.   Marland Kitchen PROCTOZONE-HC 2.5 % rectal cream Place 1 application rectally as needed. Reported on 10/03/2015   No facility-administered encounter medications on file as of 11/23/2017.     Allergy:  Allergies   Allergen Reactions  . Latex Rash    Social Hx:   Social History   Socioeconomic History  . Marital status: Single    Spouse name: Not on file  . Number of children: 0  . Years of education: Not on file  . Highest education level: Not on file  Occupational History  . Occupation: retired Archivist  . Financial resource strain: Not on file  . Food insecurity:    Worry: Not on file    Inability: Not on file  . Transportation needs:    Medical: Not on file    Non-medical: Not on file  Tobacco Use  . Smoking status: Never Smoker  . Smokeless tobacco: Never Used  Substance and Sexual Activity  . Alcohol use: Yes    Comment: rarely  . Drug use: No  . Sexual activity: Never    Birth control/protection: Post-menopausal  Lifestyle  . Physical activity:    Days per week: Not on file    Minutes per session: Not on file  . Stress: Not on file  Relationships  . Social connections:    Talks on phone: Not on file    Gets together: Not on file    Attends religious service: Not on file    Active member of club or organization: Not on file    Attends meetings of clubs or organizations: Not on file    Relationship status: Not on file  . Intimate  partner violence:    Fear of current or ex partner: Not on file    Emotionally abused: Not on file    Physically abused: Not on file    Forced sexual activity: Not on file  Other Topics Concern  . Not on file  Social History Narrative  . Not on file    Past Surgical Hx:  Past Surgical History:  Procedure Laterality Date  . BREAST SURGERY     left breast bx - benign  . COLONOSCOPY  2017  . DILATATION & CURETTAGE/HYSTEROSCOPY WITH TRUECLEAR N/A 05/17/2014   Procedure: HYSTEROSCOPY WITH POLYPECTOMY;  Surgeon: Claiborne Billings A. Pamala Hurry, MD;  Location: [facility];  Service: Gynecology;  Laterality: N/A;  . LYMPH NODE DISSECTION N/A 06/05/2014   Procedure: LYMPH NODE DISSECTION;  Surgeon: Imagene Gurney A. Alycia Rossetti, MD;  Location: [facility];   Service: Gynecology;  Laterality: N/A;  . ROBOTIC ASSISTED TOTAL HYSTERECTOMY WITH BILATERAL SALPINGO OOPHERECTOMY N/A 06/05/2014   Procedure: ROBOTIC ASSISTED TOTAL HYSTERECTOMY WITH BILATERAL SALPINGO OOPHORECTOMY;  Surgeon: Imagene Gurney A. Alycia Rossetti, MD;  Location: [facility];  Service: Gynecology;  Laterality: N/A;  . tmj mandibular advancement    . TONSILLECTOMY     age 56  . WISDOM TOOTH EXTRACTION      Past Medical Hx:  Past Medical History:  Diagnosis Date  . Cancer [facility])    ENDOMETRIAL CANCER  . Heart murmur    as child, no problems   . Hyperlipidemia   . Hypertension   . Radiation 08/21/14, 08/28/14, 09/04/14, 09/06/14, 09/18/14   vaginal cuff brachytherapy 30 gray  . Seasonal allergies     Oncology Hx:    Endometrial ca ([place])   05/17/2014 Initial Diagnosis    Endometrial ca      06/05/2014 Surgery    TRH/BSO and staging. IAUPSC 0/12 nodes, no LVSI      07/12/2014 -  Chemotherapy    paclitaxel and carboplatin      08/21/2014 - 09/18/2014 Radiation Therapy          Uterine cancer ([place]) (Resolved)   05/17/2014 Initial Diagnosis    Uterine cancer       - 11/01/2014 Chemotherapy    Completed 6 cycles of paclitaxel and carboplatin based chemotherapy      06/05/2014 Surgery    IA UPSC       Radiation Therapy          Family Hx:  Family History  Problem Relation Age of Onset  . Mental retardation Mother   . Kidney disease Father   . Stroke Father   . Cancer Maternal Uncle        unknown  . Breast cancer Maternal Aunt   . Bone cancer Paternal Uncle   . Breast cancer Cousin    Review of Systems  Constitutional: Negative.   HENT:  Negative.   Eyes: Negative.   Respiratory: Negative.   Cardiovascular: Negative.   Gastrointestinal: Negative.   Endocrine: Negative.   Genitourinary: Negative.    Musculoskeletal: Negative.   Skin: Negative.   Neurological: Negative.   Hematological: Negative.   Psychiatric/Behavioral: Negative.     Vitals:  Blood pressure (!)  143/93, pulse 81, temperature 98.8 F (37.1 C), temperature source Oral, resp. rate 20, height 5\' 3"  (1.6 m), weight 126 lb 3.2 oz (57.2 kg), SpO2 99 %.  Physical Exam: ECOG PERFORMANCE STATUS: 0 - Asymptomatic   General :  Well developed, 72 y.o., female in no apparent distress HEENT:  Normocephalic/atraumatic, symmetric, EOMI, eyelids normal Neck:  Supple, no masses.  Lymphatics:  No cervical/ submandibular/ supraclavicular/ infraclavicular/ inguinal adenopathy Respiratory:  Respirations unlabored, no use of accessory muscles CV:   Deferred Breast:  Deferred Musculoskeletal: No CVA tenderness, normal muscle strength. Abdomen:  Soft, non-tender and nondistended. No evidence of hernia. No masses. Extremities:  No lymphedema, no erythema, non-tender. Skin:   Normal inspection Neuro/Psych:  No focal motor deficit, no abnormal mental status. Normal gait. Normal affect. Alert and oriented to person, place, and time  Genito Urinary: Vulva: Normal external female genitalia.  Bladder/urethra: Urethral meatus normal in size and location. No lesions or   masses, well supported bladder Speculum exam: Vagina: Notable for telangiectasias and radiation changes , no lesion, no discharge, no bleeding. Cervix: Surgically absent Bimanual exam:  Uterus: Surgically absent  Adnexa: No masses. Rectovaginal:  Good tone, no masses, no cul de sac nodularity, no parametrial involvement or nodularity.   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 Her preoperative staging was negative. Her surgical staging was similarly negative. She's been treated adjuvantly with dose dense paclitaxel and carboplatin. She's completed all of her therapy with her last cycle of chemotherapy being Nov 01, 2014. She had a negative post-treatment CT scan.   PLAN:  1. Pap was performed today and will be done once annually (every May) 2. A CA125 will be done  today and in followup 3. Return in 6 months for pelvic and CA125 4. Defer followup with Dr. Sondra Come to patient convenience.   Isabel Caprice, MD 11/30/2017, 1:25 PM   Cc: Aloha Gell, MD (Referring Ob/Gyn) [name], MD (PCP)

## 2017-11-24 LAB — CA 125: Cancer Antigen (CA) 125: 17.9 U/mL (ref 0.0–38.1)

## 2017-11-25 LAB — CYTOLOGY - PAP

## 2017-11-26 ENCOUNTER — Ambulatory Visit: Payer: Self-pay | Admitting: Gynecologic Oncology

## 2017-11-30 ENCOUNTER — Encounter: Payer: Self-pay | Admitting: Obstetrics

## 2018-02-03 DIAGNOSIS — R69 Illness, unspecified: Secondary | ICD-10-CM | POA: Diagnosis not present

## 2018-02-11 ENCOUNTER — Telehealth: Payer: Self-pay | Admitting: *Deleted

## 2018-02-11 NOTE — Telephone Encounter (Signed)
Called and left the patient a message to call the office back. Need to move the appt from 11/22 to either 11/13 or 11/27.

## 2018-02-11 NOTE — Telephone Encounter (Signed)
Patient called back and appt was moved to 11/8

## 2018-03-25 ENCOUNTER — Other Ambulatory Visit: Payer: Self-pay | Admitting: Internal Medicine

## 2018-03-25 DIAGNOSIS — T451X5A Adverse effect of antineoplastic and immunosuppressive drugs, initial encounter: Secondary | ICD-10-CM

## 2018-03-25 DIAGNOSIS — Z0000 Encounter for general adult medical examination without abnormal findings: Secondary | ICD-10-CM

## 2018-03-25 DIAGNOSIS — D708 Other neutropenia: Secondary | ICD-10-CM

## 2018-03-25 DIAGNOSIS — G620 Drug-induced polyneuropathy: Secondary | ICD-10-CM

## 2018-03-25 DIAGNOSIS — E7849 Other hyperlipidemia: Secondary | ICD-10-CM

## 2018-03-25 DIAGNOSIS — M8580 Other specified disorders of bone density and structure, unspecified site: Secondary | ICD-10-CM

## 2018-03-25 DIAGNOSIS — C541 Malignant neoplasm of endometrium: Secondary | ICD-10-CM

## 2018-03-31 ENCOUNTER — Other Ambulatory Visit: Payer: Medicare HMO | Admitting: Internal Medicine

## 2018-03-31 DIAGNOSIS — H3562 Retinal hemorrhage, left eye: Secondary | ICD-10-CM | POA: Diagnosis not present

## 2018-03-31 DIAGNOSIS — C541 Malignant neoplasm of endometrium: Secondary | ICD-10-CM | POA: Diagnosis not present

## 2018-03-31 DIAGNOSIS — E7849 Other hyperlipidemia: Secondary | ICD-10-CM

## 2018-03-31 DIAGNOSIS — H40013 Open angle with borderline findings, low risk, bilateral: Secondary | ICD-10-CM | POA: Diagnosis not present

## 2018-03-31 DIAGNOSIS — H52223 Regular astigmatism, bilateral: Secondary | ICD-10-CM | POA: Diagnosis not present

## 2018-03-31 DIAGNOSIS — M8580 Other specified disorders of bone density and structure, unspecified site: Secondary | ICD-10-CM

## 2018-03-31 DIAGNOSIS — D708 Other neutropenia: Secondary | ICD-10-CM

## 2018-03-31 DIAGNOSIS — Z0000 Encounter for general adult medical examination without abnormal findings: Secondary | ICD-10-CM

## 2018-03-31 DIAGNOSIS — G620 Drug-induced polyneuropathy: Secondary | ICD-10-CM | POA: Diagnosis not present

## 2018-03-31 DIAGNOSIS — D500 Iron deficiency anemia secondary to blood loss (chronic): Secondary | ICD-10-CM | POA: Diagnosis not present

## 2018-03-31 DIAGNOSIS — T451X5A Adverse effect of antineoplastic and immunosuppressive drugs, initial encounter: Secondary | ICD-10-CM | POA: Diagnosis not present

## 2018-03-31 DIAGNOSIS — H5212 Myopia, left eye: Secondary | ICD-10-CM | POA: Diagnosis not present

## 2018-03-31 DIAGNOSIS — M84374S Stress fracture, right foot, sequela: Secondary | ICD-10-CM | POA: Diagnosis not present

## 2018-03-31 DIAGNOSIS — H5201 Hypermetropia, right eye: Secondary | ICD-10-CM | POA: Diagnosis not present

## 2018-03-31 DIAGNOSIS — M1990 Unspecified osteoarthritis, unspecified site: Secondary | ICD-10-CM | POA: Diagnosis not present

## 2018-04-04 ENCOUNTER — Encounter: Payer: Self-pay | Admitting: Internal Medicine

## 2018-04-04 ENCOUNTER — Ambulatory Visit (INDEPENDENT_AMBULATORY_CARE_PROVIDER_SITE_OTHER): Payer: Medicare HMO | Admitting: Internal Medicine

## 2018-04-04 VITALS — BP 140/80 | HR 86 | Ht 63.0 in | Wt 124.0 lb

## 2018-04-04 DIAGNOSIS — C541 Malignant neoplasm of endometrium: Secondary | ICD-10-CM | POA: Diagnosis not present

## 2018-04-04 DIAGNOSIS — R030 Elevated blood-pressure reading, without diagnosis of hypertension: Secondary | ICD-10-CM

## 2018-04-04 DIAGNOSIS — E7849 Other hyperlipidemia: Secondary | ICD-10-CM | POA: Diagnosis not present

## 2018-04-04 DIAGNOSIS — M8580 Other specified disorders of bone density and structure, unspecified site: Secondary | ICD-10-CM

## 2018-04-04 DIAGNOSIS — Z23 Encounter for immunization: Secondary | ICD-10-CM

## 2018-04-04 DIAGNOSIS — Z0000 Encounter for general adult medical examination without abnormal findings: Secondary | ICD-10-CM | POA: Diagnosis not present

## 2018-04-04 DIAGNOSIS — G620 Drug-induced polyneuropathy: Secondary | ICD-10-CM | POA: Diagnosis not present

## 2018-04-04 DIAGNOSIS — M84374S Stress fracture, right foot, sequela: Secondary | ICD-10-CM | POA: Diagnosis not present

## 2018-04-04 DIAGNOSIS — T451X5A Adverse effect of antineoplastic and immunosuppressive drugs, initial encounter: Secondary | ICD-10-CM | POA: Diagnosis not present

## 2018-04-04 LAB — POCT URINALYSIS DIPSTICK
Appearance: NORMAL
Bilirubin, UA: NEGATIVE
Blood, UA: NEGATIVE
Glucose, UA: NEGATIVE
Ketones, UA: NEGATIVE
Leukocytes, UA: NEGATIVE
Nitrite, UA: NEGATIVE
Odor: NORMAL
Protein, UA: NEGATIVE
Spec Grav, UA: 1.01 (ref 1.010–1.025)
Urobilinogen, UA: 0.2 E.U./dL
pH, UA: 6.5 (ref 5.0–8.0)

## 2018-04-04 NOTE — Patient Instructions (Addendum)
Follow BP readings and return in 4 weeks. Stop aspirin.  Continue current medications.

## 2018-04-04 NOTE — Progress Notes (Signed)
Subjective:    Patient ID: Annette Tucker, female    DOB: 1945/12/24, 72 y.o.   MRN: [id number]  HPI Pt here for routine health maintenance, Medicare Wellness and evaluation of medical issues.Has been having elevated BP readings at physician office  Seen by Dr. Marica Otter recently for small retinal hemorrhage. Concern raised for BP control. Pt has office HTN. Has elevated readings at various doctors' offices recently but BP at home has been fine. Suggest taking Xanax before coming to doctor's office. Continue to monitor BP at home and follow up in 4 weeks with BP readings. Pt gets anxious at doctor's offices due to history or GYN cancer.  Pt has been taking low dose ASA and was advised to stop especially with hx of retinal hemorrhage.  In 2015 she noted vaginal spotting.  She had a D&C that revealed endometrial cancer and was referred to GYN oncologist.  Subsequently underwent robotic assisted TAH/BSO with lymph node dissection.  Lymph nodes were negative.  Stage was 1-A.  She had a high-grade serous endometrial carcinoma.  She received radiation chemotherapy.  Hemotherapy was with Taxol and carboplatin.  She feels well and is doing well.  Continues to be followed by GYN oncology.  History of osteopenia and osteoarthritis.  History of hyperlipidemia.  Stress fracture right foot seen by Dr. Durward Fortes March 2019.  Some mild peripheral neuropathy in feet that is chemotherapy-induced  Was diagnosed by Dr. Lynn Ito with mitral valve prolapse in 2011 on echocardiogram.  She takes vitamin D supplement.  She took Fosamax for a couple of years for osteopenia.  Had Zostavax vaccine in September 2011.  Tonsillectomy at age 59, TMJ mandibular advancement surgery 1986, colonoscopy September 2006 in September 2016.  Tetanus immunization is up-to-date.  Gets annual mammogram.  Social history: She completed college and worked in Scientist, research (medical) for a number of years.  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  She is a native of [facility].  Does not smoke or consume alcohol.  She is single.  Family history: Mother with history of Alzheimer's dementia.  Father died at age 34 with kidney disease with history of MI, hypertension, CVA.  One brother with hyperlipidemia.        Review of Systems  Constitutional: Negative.   Respiratory: Negative.   Cardiovascular: Negative.   Gastrointestinal: Negative.   Genitourinary: Negative.   Neurological: Negative.   Psychiatric/Behavioral: Negative.    left retinal hemorrhage- see above     Objective:   Physical Exam  Constitutional: She is oriented to person, place, and time. She appears well-developed and well-nourished. No distress.  HENT:  Head: Normocephalic and atraumatic.  Right Ear: External ear normal.  Left Ear: External ear normal.  Mouth/Throat: Oropharynx is clear and moist. No oropharyngeal exudate.  Eyes: Pupils are equal, round, and reactive to light. Conjunctivae and EOM are normal. Right eye exhibits no discharge. Left eye exhibits no discharge. No scleral icterus.  Neck: Neck supple. No JVD present. No tracheal deviation present. No thyromegaly present.  Cardiovascular: Normal rate, regular rhythm and normal heart sounds.  No murmur heard. Pulmonary/Chest: Effort normal and breath sounds normal. No stridor. No respiratory distress. She has no wheezes. She has no rales.  Breast without masses  Abdominal: Soft. Bowel sounds are normal. She exhibits no distension and no mass. There is no tenderness. There is no rebound and no guarding. No hernia.  Genitourinary:  Genitourinary Comments: Deferred status post robotic  TAH/BSO  Musculoskeletal: She exhibits no edema.  Lymphadenopathy:    She has no cervical adenopathy.  Neurological: She is alert and oriented to person, place, and time. She displays normal reflexes. No cranial nerve deficit. Coordination normal.  Skin: Skin is warm and  dry. No rash noted. She is not diaphoretic.  Psychiatric: She has a normal mood and affect. Her behavior is normal. Judgment and thought content normal.  Vitals reviewed.  Patient did request Pap in May 2019.  This was negative except for benign reactive changes associated with radiation.       Assessment & Plan:  Stage Ia endometrial cancer status post robotic assisted TAH/BSO with lymph node dissection and doing well status post chemotherapy and radiation therapy.  Ca1 25 in May was 17.9  Osteopenia a DEXA scan 2018 lowest T score -1.8.  Continue to monitor.  Osteoarthritis-stable  History of essential hypertension-currently not taking blood pressure medication.  Is to monitor blood pressure 4 weeks and return here.  Has had recent retinal hemorrhage.  Stop aspirin.  Hyperlipidemia treated with statin medication and lipid panel normal  Remote history of mitral valve prolapse diagnosed in 2001 on echocardiogram  Recent retinal hemorrhage followed by Dr. Marica Otter  Plan: Patient will monitor blood pressure checks at home and return in 4 weeks.  History of anxiety and labile hypertension likely related to office hypertension.  She used to take antihypertensive medication but currently is not doing so.  Last your blood pressure was excellent.  Subjective:   Patient presents for Medicare Annual/Subsequent preventive examination.  Review Past Medical/Family/Social: See above   Risk Factors  Current exercise habits: Watch his weight and exercises regularly.  Low-fat low carbohydrate discussed Dietary issues discussed: See above  Cardiac risk factors: Father with history of MI, patient has history of hyperlipidemia  Depression Screen  (Note: if answer to either of the following is "Yes", a more complete depression screening is indicated)   Over the past two weeks, have you felt down, depressed or hopeless? No  Over the past two weeks, have you felt little interest or pleasure  in doing things? No Have you lost interest or pleasure in daily life? No Do you often feel hopeless? No Do you cry easily over simple problems? No   Activities of Daily Living  In your present state of health, do you have any difficulty performing the following activities?:   Driving? No  Managing money? No  Feeding yourself? No  Getting from bed to chair? No  Climbing a flight of stairs? No  Preparing food and eating?: No  Bathing or showering? No  Getting dressed: No  Getting to the toilet? No  Using the toilet:No  Moving around from place to place: No  In the past year have you fallen or had a near fall?:No  Are you sexually active? No  Do you have more than one partner? No   Hearing Difficulties: No  Do you often ask people to speak up or repeat themselves? No  Do you experience ringing or noises in your ears? No  Do you have difficulty understanding soft or whispered voices? No  Do you feel that you have a problem with memory? No Do you often misplace items? No    Home Safety:  Do you have a smoke alarm at your residence? Yes Do you have grab bars in the bathroom?  No Do you have throw rugs in your house?  Yes   Cognitive Testing  Alert?  Yes Normal Appearance?Yes  Oriented to person? Yes Place? Yes  Time? Yes  Recall of three objects? Yes  Can perform simple calculations? Yes  Displays appropriate judgment?Yes  Can read the correct time from a watch face?Yes   List the Names of Other Physician/Practitioners you currently use:  See referral list for the physicians patient is currently seeing.  Dr. Marica Otter  GYN oncology   Review of Systems: See above   Objective:     General appearance: Appears stated age and thin Head: Normocephalic, without obvious abnormality, atraumatic  Eyes: conj clear, EOMi PEERLA  Ears: normal TM's and external ear canals both ears  Nose: Nares normal. Septum midline. Mucosa normal. No drainage or sinus tenderness.    Throat: lips, mucosa, and tongue normal; teeth and gums normal  Neck: no adenopathy, no carotid bruit, no JVD, supple, symmetrical, trachea midline and thyroid not enlarged, symmetric, no tenderness/mass/nodules  No CVA tenderness.  Lungs: clear to auscultation bilaterally  Breasts: normal appearance, no masses or tenderness, top of the pacemaker on left upper chest. Incision well-healed. It is tender.  Heart: regular rate and rhythm, S1, S2 normal, no murmur, click, rub or gallop  Abdomen: soft, non-tender; bowel sounds normal; no masses, no organomegaly  Musculoskeletal: ROM normal in all joints, no crepitus, no deformity, Normal muscle strengthen. Back  is symmetric, no curvature. Skin: Skin color, texture, turgor normal. No rashes or lesions  Lymph nodes: Cervical, supraclavicular, and axillary nodes normal.  Neurologic: CN 2 -12 Normal, Normal symmetric reflexes. Normal coordination and gait  Psych: Alert & Oriented x 3, Mood appear stable.    Assessment:    Annual wellness medicare exam   Plan:    During the course of the visit the patient was educated and counseled about appropriate screening and preventive services including:   Annual mammogram-done  Annual flu vaccine  Colonoscopy up-to-date  Tetanus immunization up-to-date     Patient Instructions (the written plan) was given to the patient.  Medicare Attestation  I have personally reviewed:  The patient's medical and social history  Their use of alcohol, tobacco or illicit drugs  Their current medications and supplements  The patient's functional ability including ADLs,fall risks, home safety risks, cognitive, and hearing and visual impairment  Diet and physical activities  Evidence for depression or mood disorders  The patient's weight, height, BMI, and visual acuity have been recorded in the chart. I have made referrals, counseling, and provided education to the patient based on review of the above and I have  provided the patient with a written personalized care plan for preventive services.

## 2018-04-05 LAB — TEST AUTHORIZATION

## 2018-04-05 LAB — CBC WITH DIFFERENTIAL/PLATELET
Basophils Absolute: 28 cells/uL (ref 0–200)
Basophils Relative: 0.6 %
Eosinophils Absolute: 69 cells/uL (ref 15–500)
Eosinophils Relative: 1.5 %
HCT: 40 % (ref 35.0–45.0)
Hemoglobin: 13.6 g/dL (ref 11.7–15.5)
Lymphs Abs: 1173 cells/uL (ref 850–3900)
MCH: 30.6 pg (ref 27.0–33.0)
MCHC: 34 g/dL (ref 32.0–36.0)
MCV: 89.9 fL (ref 80.0–100.0)
MPV: 11.1 fL (ref 7.5–12.5)
Monocytes Relative: 13.4 %
Neutro Abs: 2714 cells/uL (ref 1500–7800)
Neutrophils Relative %: 59 %
Platelets: 253 10*3/uL (ref 140–400)
RBC: 4.45 10*6/uL (ref 3.80–5.10)
RDW: 13.1 % (ref 11.0–15.0)
Total Lymphocyte: 25.5 %
WBC mixed population: 616 cells/uL (ref 200–950)
WBC: 4.6 10*3/uL (ref 3.8–10.8)

## 2018-04-05 LAB — COMPLETE METABOLIC PANEL WITH GFR
AG Ratio: 1.9 (calc) (ref 1.0–2.5)
ALT: 18 U/L (ref 6–29)
AST: 21 U/L (ref 10–35)
Albumin: 4.6 g/dL (ref 3.6–5.1)
Alkaline phosphatase (APISO): 65 U/L (ref 33–130)
BUN: 13 mg/dL (ref 7–25)
CO2: 27 mmol/L (ref 20–32)
Calcium: 9.5 mg/dL (ref 8.6–10.4)
Chloride: 102 mmol/L (ref 98–110)
Creat: 0.82 mg/dL (ref 0.50–0.99)
GFR, Est African American: 85 mL/min/{1.73_m2} (ref >=60)
GFR, Est Non African American: 73 mL/min/{1.73_m2} (ref >=60)
Globulin: 2.4 g/dL (calc) (ref 1.9–3.7)
Glucose, Bld: 92 mg/dL (ref 65–99)
Potassium: 4.7 mmol/L (ref 3.5–5.3)
Sodium: 140 mmol/L (ref 135–146)
Total Bilirubin: 0.7 mg/dL (ref 0.2–1.2)
Total Protein: 7 g/dL (ref 6.1–8.1)

## 2018-04-05 LAB — LIPID PANEL
Cholesterol: 191 mg/dL (ref <200)
HDL: 77 mg/dL (ref >50)
LDL Cholesterol (Calc): 99 mg/dL (calc)
Non-HDL Cholesterol (Calc): 114 mg/dL (calc) (ref <130)
Total CHOL/HDL Ratio: 2.5 (calc) (ref <5.0)
Triglycerides: 64 mg/dL (ref <150)

## 2018-04-05 LAB — VITAMIN D 25 HYDROXY (VIT D DEFICIENCY, FRACTURES): Vit D, 25-Hydroxy: 64 ng/mL (ref 30–100)

## 2018-04-05 LAB — TSH: TSH: 1.92 mIU/L (ref 0.40–4.50)

## 2018-04-07 ENCOUNTER — Encounter: Payer: Self-pay | Admitting: Internal Medicine

## 2018-04-07 DIAGNOSIS — Z1231 Encounter for screening mammogram for malignant neoplasm of breast: Secondary | ICD-10-CM | POA: Diagnosis not present

## 2018-05-05 ENCOUNTER — Encounter: Payer: Self-pay | Admitting: Internal Medicine

## 2018-05-05 ENCOUNTER — Ambulatory Visit (INDEPENDENT_AMBULATORY_CARE_PROVIDER_SITE_OTHER): Payer: Medicare HMO | Admitting: Internal Medicine

## 2018-05-05 VITALS — BP 144/90 | HR 78 | Ht 63.0 in | Wt 124.0 lb

## 2018-05-05 DIAGNOSIS — I10 Essential (primary) hypertension: Secondary | ICD-10-CM | POA: Diagnosis not present

## 2018-05-05 MED ORDER — AMLODIPINE BESYLATE 2.5 MG PO TABS: 2.5000 mg | ORAL_TABLET | Freq: Every day | ORAL | 3 refills | Status: DC

## 2018-05-05 NOTE — Progress Notes (Signed)
   Subjective:    Patient ID: Annette Tucker, female    DOB: 03-Jul-1945, 72 y.o.   MRN: [id number]  HPI  72 year old Female for elevated blood pressure readings.  She brings in multiple blood pressure readings over the past month which have been reviewed.  Systolic blood pressures range from 388 systolically to as high as 178.  There is a large fluctuation.  Diastolics range from 71 to as high as 105.  There may be situations that elevate her blood pressure.  Sometimes on Sundays has more salt and caffeine and she does at other times during the week.  I think there is a fair amount of lability with her blood pressure readings.  I would like her to try amlodipine 2.5 mg daily.   Review of Systems     Objective:   Physical Exam  Spent 10 minutes speaking with her about this issue.      Assessment & Plan:  Labile blood pressure readings  Probably has an element of essential hypertension that is related to age and stiffness of her vessels  Plan: Amlodipine 2.5 mg daily and follow-up in 8 weeks

## 2018-05-05 NOTE — Patient Instructions (Signed)
Start amlodipine 2.5 mg daily Follow up January 2020.

## 2018-05-06 ENCOUNTER — Encounter: Payer: Self-pay | Admitting: Obstetrics

## 2018-05-06 ENCOUNTER — Inpatient Hospital Stay: Payer: Medicare HMO

## 2018-05-06 ENCOUNTER — Inpatient Hospital Stay: Payer: Medicare HMO | Attending: Obstetrics | Admitting: Obstetrics

## 2018-05-06 VITALS — BP 145/91 | HR 78 | Temp 98.3°F | Resp 18 | Ht 63.0 in | Wt 123.5 lb

## 2018-05-06 DIAGNOSIS — Z90710 Acquired absence of both cervix and uterus: Secondary | ICD-10-CM

## 2018-05-06 DIAGNOSIS — C541 Malignant neoplasm of endometrium: Secondary | ICD-10-CM

## 2018-05-06 DIAGNOSIS — Z90722 Acquired absence of ovaries, bilateral: Secondary | ICD-10-CM | POA: Diagnosis not present

## 2018-05-06 DIAGNOSIS — Z8542 Personal history of malignant neoplasm of other parts of uterus: Secondary | ICD-10-CM | POA: Diagnosis not present

## 2018-05-06 DIAGNOSIS — Z923 Personal history of irradiation: Secondary | ICD-10-CM | POA: Insufficient documentation

## 2018-05-06 DIAGNOSIS — Z9221 Personal history of antineoplastic chemotherapy: Secondary | ICD-10-CM | POA: Insufficient documentation

## 2018-05-06 DIAGNOSIS — Z08 Encounter for follow-up examination after completed treatment for malignant neoplasm: Secondary | ICD-10-CM | POA: Diagnosis not present

## 2018-05-06 NOTE — Patient Instructions (Signed)
1. Return in 6 months for pelvic and pap 2. Labwork today CA125 and we'll call you with results

## 2018-05-06 NOTE — Progress Notes (Signed)
Consult Note: Gyn-Onc  Annette Tucker 72 y.o. female  CC:  Chief Complaint  Patient presents with  . Endometrial ca [facility])   GYN Oncologic Summary 1. Stage IA  UPSC December 2015.   06/05/2014 TRH/BSO and staging. IA UPSC 0/12 nodes, no LVSI  10/2014 completed adjuvant chemo with dose dense paclitaxel and carboplatin.   HDR Vag Brachy x 5 radiation 3-09/2014 with Dr. Sondra Come  HPI: Patient was initially seen by [name] in consultation at the request of Dr. Pamala Hurry.  Annette Tucker is a very nice a 72 y.o. G0   Interval History: She was last seen by me May 2019  She is doing well has no complaints.  Specifically denies vaginal bleeding and pelvic pain.     She continues to use her vaginal dilator twice weekly.   Oncologic History: She began experiencing some low back pain and then spotting starting in August.. She had an exam which showed significant atrophy and hormone cream was prescribed. She then continued to have some spotting and discharge went to see Dr. Pamala Hurry. Ultrasound was performed that revealed a 4 x 3 x 2 centimeter uterus with a 4.8 milliliter endometrial stripe. There were 2 small lesions measuring 0.5 cm each consistent with polyps. She had been on hormone replacement therapy until 2012. She had been on it approximately 10 years. Pathology from her D&C revealed high-grade endometrial cancer with serous features. For this reason that she wass referred to [place].  06/05/14: Surgery: Total robotic hysterectomy bilateral salpingo-oophorectomy, bilateral pelvic and para-aortic lymph node dissection.   Operative findings: Normal uterus, cervix, adnexal and abdominal survey, No pathologically enlarged nodes identified.   Pathology:  Diagnosis 1. Lymph node, biopsy, right para aortic nodes - ONE LYMPH NODE, NEGATIVE FOR METASTATIC CARCINOMA (0/1). 2. Lymph node, biopsy, left para aortic node - THREE LYMPH NODES, NEGATIVE FOR METASTATIC CARCINOMA (0/3). 3. Lymph nodes,  regional resection, right pelvic - FOUR LYMPH NODES, NEGATIVE FOR METASTATIC CARCINOMA (0/4). 4. Lymph nodes, regional resection, left pelvic - FOUR LYMPH NODES, NEGATIVE FOR METASTATIC CARCINOMA (0/4). 5. Uterus +/- tubes/ovaries, neoplastic, with cervix - SUPERFICIALLY INVASIVE SEROUS CARCINOMA, 0.5 CM, CONFINED WITHIN THE INNER HALF OF THE MYOMETRIUM. (20%, no LVIS) - ADJACENT BENIGN ENDOMETRIAL POLYPS WITH NO EVIDENCE OF ATYPIA OR MALIGNANCY. - CERVIX: BENIGN SQUAMOUS MUCOSA AND ENDOCERVICAL MUCOSA, NO DYSPLASIA OR MALIGNANCY. - BILATERAL OVARIES: BENIGN OVARIAN TISSUE WITH ENDOMETRIOSIS AND ENDOSALPINGIOSIS, NO ATYPIA OR MALIGNANCY. - BILATERAL FALLOPIAN TUBES: BENIGN FALLOPIAN TUBAL TISSUE, NO EVIDENCE OF ATYPIA OR MALIGNANCY. - MYOMETRIUM: LEIOMYOMATA.  We saw her for her postoperative check in the latter part of January 2016. Since that time she completed 6 cycles of dose dense paclitaxel and carboplatin. She completed day #8 of cycle #6 on Nov 01, 2014. In addition, she's completed her high-dose vaginal cuff brachytherapy with Dr. Sondra Come.  She had a posttreatment CT scan that revealed (12/27/14): FINDINGS: Lower chest: Lung bases are clear.  Hepatobiliary: No focal hepatic lesion. No biliary duct dilatation.Gallbladder is normal. Common bile duct is normal.  Pancreas: Pancreas is normal. No ductal dilatation. No pancreatic inflammation.  Spleen: Linear hypodense band within the mid spleen extending from the hilum to the capsule (image 12, series 2) most suggestive of a splenic infarction. Splenic laceration could have a similar pattern. No evidence of acute injury.  Adrenals/urinary tract: Adrenal glands are normal. There is a nonenhancing cyst of the left kidney. Ureters are normal. The bladder is normal.  Stomach/Bowel: Stomach, small-bowel, and cecum normal. The  colon and rectosigmoid colon normal. There is a large stool ball in the rectum measuring 7 cm in  diameter.  Vascular/Lymphatic: Abdominal aorta is normal caliber. There is minimal of vascular intimal calcification. Splenic artery appears normal.  There is no retroperitoneal lymphadenopathy. No periportal adenopathy. No mesenteric adenopathy. No pelvic lymphadenopathy.  Reproductive: Post hysterectomy anatomy. No adnexal abnormality.  Musculoskeletal: No aggressive osseous lesion.  Other: No free fluid.  IMPRESSION: 1. No evidence of endometrial carcinoma recurrence within the abdomen or pelvis. 2. New linear band within the spleen is most suggestive of a splenic infarction. 3. Probable benign left renal cyst. 4. Large stool ball in the rectum.   She had a follow-up CT scan 06/26/2015. It showed no evidence of metastatic disease. She had resolution of the splenic infarct.    Measurement of Disease: CA125 (Noted to be normal preop 05/30/2014 = 23) 11/23/17 = 17.9  Recent Labs    11/23/17 1200  CAN125 17.9     Current Meds:  Outpatient Encounter Medications as of 05/06/2018  Medication Sig  . amLODipine (NORVASC) 2.5 MG tablet Take 1 tablet (2.5 mg total) by mouth daily.  . Calcium Carbonate-Vitamin D (CALCIUM 600+D) 600-400 MG-UNIT per tablet Take 1 tablet by mouth 2 (two) times daily. @lunch  and in the evening  . cholecalciferol (VITAMIN D) 1000 UNITS tablet Take 1,000 Units by mouth daily.   . Coenzyme Q10 (CO Q-10) 100 MG CAPS Take 1 capsule by mouth every morning.   . fish oil-omega-3 fatty acids 1000 MG capsule Take 1 g by mouth every morning.   Marland Kitchen FLAXSEED, LINSEED, PO Take 1,000 mg by mouth daily.  . Magnesium 250 MG TABS Take 1 tablet by mouth every evening.   . Multiple Vitamins-Minerals (MULTIVITAMIN WITH MINERALS) tablet Take 1 tablet by mouth every morning.   . simvastatin (ZOCOR) 20 MG tablet TAKE 1 TABLET BY MOUTH AT BEDTIME  . vitamin E 400 UNIT capsule Take 400 Units by mouth every morning.   . [DISCONTINUED] PROCTOZONE-HC 2.5 % rectal cream Place 1  application rectally as needed. Reported on 10/03/2015   No facility-administered encounter medications on file as of 05/06/2018.     Allergy:  Allergies  Allergen Reactions  . Latex Rash    Social Hx:   Social History   Socioeconomic History  . Marital status: Single    Spouse name: Not on file  . Number of children: 0  . Years of education: Not on file  . Highest education level: Not on file  Occupational History  . Occupation: retired Archivist  . Financial resource strain: Not on file  . Food insecurity:    Worry: Not on file    Inability: Not on file  . Transportation needs:    Medical: Not on file    Non-medical: Not on file  Tobacco Use  . Smoking status: Never Smoker  . Smokeless tobacco: Never Used  Substance and Sexual Activity  . Alcohol use: Yes    Comment: rarely  . Drug use: No  . Sexual activity: Never    Birth control/protection: Post-menopausal  Lifestyle  . Physical activity:    Days per week: Not on file    Minutes per session: Not on file  . Stress: Not on file  Relationships  . Social connections:    Talks on phone: Not on file    Gets together: Not on file    Attends religious service: Not on file    Active  member of club or organization: Not on file    Attends meetings of clubs or organizations: Not on file    Relationship status: Not on file  . Intimate partner violence:    Fear of current or ex partner: Not on file    Emotionally abused: Not on file    Physically abused: Not on file    Forced sexual activity: Not on file  Other Topics Concern  . Not on file  Social History Narrative  . Not on file    Past Surgical Hx:  Past Surgical History:  Procedure Laterality Date  . BREAST SURGERY     left breast bx - benign  . COLONOSCOPY  2017  . DILATATION & CURETTAGE/HYSTEROSCOPY WITH TRUECLEAR N/A 05/17/2014   Procedure: HYSTEROSCOPY WITH POLYPECTOMY;  Surgeon: Claiborne Billings A. Pamala Hurry, MD;  Location: [facility];  Service:  Gynecology;  Laterality: N/A;  . LYMPH NODE DISSECTION N/A 06/05/2014   Procedure: LYMPH NODE DISSECTION;  Surgeon: Imagene Gurney A. Alycia Rossetti, MD;  Location: [facility];  Service: Gynecology;  Laterality: N/A;  . ROBOTIC ASSISTED TOTAL HYSTERECTOMY WITH BILATERAL SALPINGO OOPHERECTOMY N/A 06/05/2014   Procedure: ROBOTIC ASSISTED TOTAL HYSTERECTOMY WITH BILATERAL SALPINGO OOPHORECTOMY;  Surgeon: Imagene Gurney A. Alycia Rossetti, MD;  Location: [facility];  Service: Gynecology;  Laterality: N/A;  . tmj mandibular advancement    . TONSILLECTOMY     age 47  . WISDOM TOOTH EXTRACTION      Past Medical Hx:  Past Medical History:  Diagnosis Date  . Cancer [facility])    ENDOMETRIAL CANCER  . Heart murmur    as child, no problems   . Hyperlipidemia   . Hypertension   . Radiation 08/21/14, 08/28/14, 09/04/14, 09/06/14, 09/18/14   vaginal cuff brachytherapy 30 gray  . Seasonal allergies     Oncology Hx:    Endometrial ca ([place])   05/17/2014 Initial Diagnosis    Endometrial ca    06/05/2014 Surgery    TRH/BSO and staging. IAUPSC 0/12 nodes, no LVSI    07/12/2014 -  Chemotherapy    paclitaxel and carboplatin    08/21/2014 - 09/18/2014 Radiation Therapy        Uterine cancer ([place]) (Resolved)   05/17/2014 Initial Diagnosis    Uterine cancer     - 11/01/2014 Chemotherapy    Completed 6 cycles of paclitaxel and carboplatin based chemotherapy    06/05/2014 Surgery    IA UPSC     Radiation Therapy        Family Hx:  Family History  Problem Relation Age of Onset  . Mental retardation Mother   . Kidney disease Father   . Stroke Father   . Cancer Maternal Uncle        unknown  . Breast cancer Maternal Aunt   . Bone cancer Paternal Uncle   . Breast cancer Cousin    Review of Systems  Constitutional: Negative.   HENT:  Negative.   Eyes: Negative.   Respiratory: Negative.   Cardiovascular: Negative.   Gastrointestinal: Negative.   Endocrine: Negative.   Genitourinary: Negative.    Musculoskeletal: Negative.   Skin:  Negative.   Neurological: Negative.   Hematological: Negative.   Psychiatric/Behavioral: Negative.     Vitals:  Blood pressure (!) 145/91, pulse 78, temperature 98.3 F (36.8 C), temperature source Oral, resp. rate 18, height 5\' 3"  (1.6 m), weight 123 lb 8 oz (56 kg), SpO2 100 %.  Physical Exam: ECOG PERFORMANCE STATUS: 0 - Asymptomatic   General :  Well developed,  72 y.o., female in no apparent distress HEENT:  Normocephalic/atraumatic, symmetric, EOMI, eyelids normal Neck:   Supple, no masses.  Lymphatics:  No cervical/ submandibular/ supraclavicular/ infraclavicular/ inguinal adenopathy Respiratory:  Respirations unlabored, no use of accessory muscles CV:   Deferred Breast:  Deferred Musculoskeletal: No CVA tenderness, normal muscle strength. Abdomen:  Soft, non-tender and nondistended. No evidence of hernia. No masses. Trocar sites are soft and without mass Extremities:  No lymphedema, no erythema, non-tender. Skin:   Normal inspection Neuro/Psych:  No focal motor deficit, no abnormal mental status. Normal gait. Normal affect. Alert and oriented to person, place, and time  Genito Urinary: Vulva: Normal external female genitalia.  Bladder/urethra: Urethral meatus normal in size and location. No lesions or   masses, well supported bladder Speculum exam: Vagina: Notable for telangiectasias and radiation changes , no lesion, no discharge, no bleeding. Cervix: Surgically absent Bimanual exam:  Uterus: Surgically absent  Adnexa: No masses. Rectovaginal:  Good tone, no masses, no cul de sac nodularity, no parametrial involvement or nodularity.   Assessment: UPSC  PLAN:  1. Pap was performed today and will be done once annually (every May) 2. A CA125 will be done today and in followup 3. Return in 6 months for pelvic and CA125 4. Once Spring 2021 she will be dispositioned back to Dr. Pamala Hurry  Face to face time with patient was 15 minutes. Over 50% of this time was spent on  counseling and coordination of care.    Annette Caprice, MD 05/06/2018, 11:27 AM   Cc: Aloha Gell, MD (Referring Ob/Gyn) [name], MD (PCP)

## 2018-05-07 LAB — CA 125: Cancer Antigen (CA) 125: 19.1 U/mL (ref 0.0–38.1)

## 2018-05-12 ENCOUNTER — Telehealth: Payer: Self-pay

## 2018-05-12 NOTE — Telephone Encounter (Signed)
Outgoing call to pt per [name] NP regarding CA 125 results as "stable , within normal range."  No answer, left VM with results and instructed to call back and let us know she received message.

## 2018-05-12 NOTE — Telephone Encounter (Signed)
See previous note- pt called back and made aware of CA 125 results as normal range, stable per MelissaNP -voiced understanding. No other needs per pt at this time.

## 2018-05-19 ENCOUNTER — Ambulatory Visit: Payer: Self-pay | Admitting: Radiation Oncology

## 2018-05-20 ENCOUNTER — Ambulatory Visit: Payer: Self-pay | Admitting: Obstetrics

## 2018-05-31 ENCOUNTER — Other Ambulatory Visit: Payer: Self-pay

## 2018-05-31 DIAGNOSIS — Z0100 Encounter for examination of eyes and vision without abnormal findings: Secondary | ICD-10-CM | POA: Diagnosis not present

## 2018-05-31 MED ORDER — SIMVASTATIN 20 MG PO TABS: ORAL_TABLET | ORAL | 1 refills | Status: DC

## 2018-07-07 ENCOUNTER — Ambulatory Visit (INDEPENDENT_AMBULATORY_CARE_PROVIDER_SITE_OTHER): Payer: Medicare HMO | Admitting: Internal Medicine

## 2018-07-07 ENCOUNTER — Encounter: Payer: Self-pay | Admitting: Internal Medicine

## 2018-07-07 VITALS — BP 110/80 | HR 77 | Temp 98.1°F | Ht 63.0 in | Wt 125.0 lb

## 2018-07-07 DIAGNOSIS — I10 Essential (primary) hypertension: Secondary | ICD-10-CM | POA: Diagnosis not present

## 2018-07-07 NOTE — Progress Notes (Signed)
   Subjective:    Patient ID: Annette Tucker, female    DOB: Jul 04, 1945, 73 y.o.   MRN: [id number]  HPI  73 year old Female for follow up on elevated BP.  At last visit in November she brought in multiple blood pressure readings were 6 tolerant blood pressures range from 100 to as high as 178 with large fluctuation.  Diastolics range from 71 to as high as 105.  She was started on amlodipine 2.5 mg daily and brings in multiple blood pressure readings today all of which are very acceptable.  She feels well on this medication and she agrees she needs to stay on it.    Review of Systems see above    Objective:   Physical Exam  BP 110/80.  Neck supple.  Chest clear.  Cardiac exam regular rate and rhythm.  Extremities without edema      Assessment & Plan:  Essential hypertension  Plan: I am pleased with how amlodipine is working for her at 2.5 mg daily.  Continue to monitor blood pressure at home and continue amlodipine 2.5 mg daily.  Follow-up in October 2020 or as needed.  She will call me if she has issues with her blood pressure or if other issues arise.

## 2018-07-26 ENCOUNTER — Encounter: Payer: Self-pay | Admitting: Internal Medicine

## 2018-07-26 NOTE — Patient Instructions (Addendum)
Continue amlodipine 2.5 mg daily.  Follow-up October 2020 2020 for physical examination, blood pressure check and lab work.

## 2018-08-18 ENCOUNTER — Telehealth: Payer: Self-pay

## 2018-08-18 NOTE — Telephone Encounter (Signed)
Incoming call from pt- she received her letter to reschedule her appt with a new provider since Dr Gerarda Fraction no longer at this practice. Pt made appt with Dr Skeet Latch on 11/10/2018 at 2 pm. No other needs per her at this time.

## 2018-08-26 ENCOUNTER — Other Ambulatory Visit: Payer: Self-pay | Admitting: Internal Medicine

## 2018-09-15 DIAGNOSIS — Z23 Encounter for immunization: Secondary | ICD-10-CM | POA: Diagnosis not present

## 2018-09-15 DIAGNOSIS — L814 Other melanin hyperpigmentation: Secondary | ICD-10-CM | POA: Diagnosis not present

## 2018-09-15 DIAGNOSIS — D2371 Other benign neoplasm of skin of right lower limb, including hip: Secondary | ICD-10-CM | POA: Diagnosis not present

## 2018-09-15 DIAGNOSIS — L821 Other seborrheic keratosis: Secondary | ICD-10-CM | POA: Diagnosis not present

## 2018-09-15 DIAGNOSIS — D225 Melanocytic nevi of trunk: Secondary | ICD-10-CM | POA: Diagnosis not present

## 2018-09-15 DIAGNOSIS — D2271 Melanocytic nevi of right lower limb, including hip: Secondary | ICD-10-CM | POA: Diagnosis not present

## 2018-10-31 ENCOUNTER — Telehealth: Payer: Self-pay

## 2018-10-31 NOTE — Telephone Encounter (Signed)
Told Annette Tucker that the appointment with [name] could be a Webex or phone visit vs in person due to the coronavirus. Annette Tucker would prefer to cancel and r/s to July. She is not having any symptoms.   She will call in June to reschedule. Pt would like to see [name] in July if possible. She has not seen [name] yet.  Annette Tucker knows if she saw [name] that she would not be seeing her on a regular basis as she is helping out during the pandemic. Pt not promised that she would see [name].Told to to ask when she called back to r/s and see what this situation is with scheduling at that time.

## 2018-11-03 ENCOUNTER — Telehealth: Payer: Self-pay | Admitting: *Deleted

## 2018-11-03 NOTE — Telephone Encounter (Signed)
Called and scheduled the patient to see [name] on 5/13

## 2018-11-08 ENCOUNTER — Telehealth: Payer: Self-pay | Admitting: *Deleted

## 2018-11-08 NOTE — Telephone Encounter (Signed)
Called and spoke with the patient regarding her appt for tomorrow. Patient has no signs/sympotms, has not traveled and has had no exposure to COVID. Explained the new check in process, new parking process and the mask/no visitor policy.  

## 2018-11-09 ENCOUNTER — Other Ambulatory Visit: Payer: Self-pay | Admitting: Gynecologic Oncology

## 2018-11-09 ENCOUNTER — Other Ambulatory Visit: Payer: Self-pay

## 2018-11-09 ENCOUNTER — Inpatient Hospital Stay: Payer: Medicare HMO | Attending: Gynecologic Oncology | Admitting: Gynecologic Oncology

## 2018-11-09 ENCOUNTER — Encounter: Payer: Self-pay | Admitting: Gynecologic Oncology

## 2018-11-09 ENCOUNTER — Other Ambulatory Visit (HOSPITAL_COMMUNITY)
Admission: RE | Admit: 2018-11-09 | Discharge: 2018-11-09 | Disposition: A | Discharge status: Home / Self Care | Payer: Medicare HMO | Source: Ambulatory Visit | Attending: Gynecologic Oncology | Admitting: Gynecologic Oncology

## 2018-11-09 VITALS — BP 141/95 | HR 86 | Temp 98.1°F | Ht 63.0 in | Wt 122.2 lb

## 2018-11-09 DIAGNOSIS — Z90722 Acquired absence of ovaries, bilateral: Secondary | ICD-10-CM

## 2018-11-09 DIAGNOSIS — Z9221 Personal history of antineoplastic chemotherapy: Secondary | ICD-10-CM | POA: Diagnosis not present

## 2018-11-09 DIAGNOSIS — C541 Malignant neoplasm of endometrium: Secondary | ICD-10-CM

## 2018-11-09 DIAGNOSIS — Z923 Personal history of irradiation: Secondary | ICD-10-CM | POA: Diagnosis not present

## 2018-11-09 DIAGNOSIS — Z90710 Acquired absence of both cervix and uterus: Secondary | ICD-10-CM | POA: Insufficient documentation

## 2018-11-09 NOTE — Progress Notes (Signed)
Consult Note: Gyn-Onc  Annette Tucker 73 y.o. female  CC:  Chief Complaint  Patient presents with  . Endometrial ca [facility])    HPI: Patient was initially seen in consultation at the request of Dr. Pamala Hurry.  Patient is a 73 year old gravida 0 who began experiencing some low back pain and then spotting starting in August.. She had an exam which showed significant atrophy and hormone cream was prescribed. She then continued to have some spotting and discharge went to see Dr. Pamala Hurry. Ultrasound was performed that revealed a 4 x 3 x 2 centimeter uterus with a 4.8 milliliter endometrial stripe. There were 2 small lesions measuring 0.5 cm each consistent with polyps. She had been on hormone replacement therapy until 2012. She had been on it approximately 10 years. Pathology from her D&C revealed high-grade endometrial cancer with serous features. For this reason that she is referred to [place] today.  Her primary physician is Dr. Tedra Senegal.  06/05/14: Surgery: Total robotic hysterectomy bilateral salpingo-oophorectomy, bilateral pelvic and para-aortic lymph node dissection.   Operative findings: Normal uterus, cervix, adnexal and abdominal survey, No pathologically enlarged nodes identified.   Pathology:  Diagnosis 1. Lymph node, biopsy, right para aortic nodes - ONE LYMPH NODE, NEGATIVE FOR METASTATIC CARCINOMA (0/1). 2. Lymph node, biopsy, left para aortic node - THREE LYMPH NODES, NEGATIVE FOR METASTATIC CARCINOMA (0/3). 3. Lymph nodes, regional resection, right pelvic - FOUR LYMPH NODES, NEGATIVE FOR METASTATIC CARCINOMA (0/4). 4. Lymph nodes, regional resection, left pelvic - FOUR LYMPH NODES, NEGATIVE FOR METASTATIC CARCINOMA (0/4). 5. Uterus +/- tubes/ovaries, neoplastic, with cervix - SUPERFICIALLY INVASIVE SEROUS CARCINOMA, 0.5 CM, CONFINED WITHIN THE INNER HALF OF THE MYOMETRIUM. (20%, no LVIS) - ADJACENT BENIGN ENDOMETRIAL POLYPS WITH NO EVIDENCE OF ATYPIA OR MALIGNANCY. - CERVIX:  BENIGN SQUAMOUS MUCOSA AND ENDOCERVICAL MUCOSA, NO DYSPLASIA OR MALIGNANCY. - BILATERAL OVARIES: BENIGN OVARIAN TISSUE WITH ENDOMETRIOSIS AND ENDOSALPINGIOSIS, NO ATYPIA OR MALIGNANCY. - BILATERAL FALLOPIAN TUBES: BENIGN FALLOPIAN TUBAL TISSUE, NO EVIDENCE OF ATYPIA OR MALIGNANCY. - MYOMETRIUM: LEIOMYOMATA.  We saw her for her postoperative check in the latter part of January 2016. Since that time she's completed 6 cycles of dose dense paclitaxel and carboplatin. She completed day #8 of cycle #6 on Nov 01, 2014. In addition, she's completed her high-dose vaginal cuff brachytherapy with Dr. Sondra Come.  She had a posttreatment CT scan that revealed (12/27/14): FINDINGS: Lower chest: Lung bases are clear.  Hepatobiliary: No focal hepatic lesion. No biliary duct dilatation.Gallbladder is normal. Common bile duct is normal.  Pancreas: Pancreas is normal. No ductal dilatation. No pancreatic inflammation.  Spleen: Linear hypodense band within the mid spleen extending from the hilum to the capsule (image 12, series 2) most suggestive of a splenic infarction. Splenic laceration could have a similar pattern. No evidence of acute injury.  Adrenals/urinary tract: Adrenal glands are normal. There is a nonenhancing cyst of the left kidney. Ureters are normal. The bladder is normal.  Stomach/Bowel: Stomach, small-bowel, and cecum normal. The colon and rectosigmoid colon normal. There is a large stool ball in the rectum measuring 7 cm in diameter.  Vascular/Lymphatic: Abdominal aorta is normal caliber. There is minimal of vascular intimal calcification. Splenic artery appears normal.  There is no retroperitoneal lymphadenopathy. No periportal adenopathy. No mesenteric adenopathy. No pelvic lymphadenopathy.  Reproductive: Post hysterectomy anatomy. No adnexal abnormality.  Musculoskeletal: No aggressive osseous lesion.  Other: No free fluid.  IMPRESSION: 1. No evidence of endometrial  carcinoma recurrence within the abdomen or pelvis. 2. New  linear band within the spleen is most suggestive of a splenic infarction. 3. Probable benign left renal cyst. 4. Large stool ball in the rectum.  She had a follow-up CT scan 06/26/2015. It showed no evidence of metastatic disease. She had resolution of the splenic infarct.   She is up-to-date on her mammograms and her bone densities.  Interval History: I last saw her in 2018 and she was last seen by Dr. Gerarda Fraction in 04/2018. She had a negative pap smear 5/19.  She is overall doing well and really has no complaints whatsoever.  She has very good neighbors that are doing the grocery shopping for her so she really has been able to stay at home and stay safe.  She does have to be careful with her diet to make sure that she has fairly regular bowel movements.  She had terrible issues with her hemorrhoids when she got constipated with her chemotherapy.  She manages her bowel movements with careful diet and has no issues at this time.  Review of Systems  Constitutional: Denies fever. Skin: No rash Cardiovascular: No chest pain, shortness of breath, or edema  Pulmonary: No cough   Gastro Intestinal: No nausea, vomiting, constipation, or diarrhea reported.  Genitourinary: Denies vaginal bleeding and discharge.  Musculoskeletal: No complaints Neurologic: No neuropathy Psychology: No changes   Current Meds:  Outpatient Encounter Medications as of 11/09/2018  Medication Sig  . amLODipine (NORVASC) 2.5 MG tablet TAKE 1 TABLET BY MOUTH DAILY  . Calcium Carbonate-Vitamin D (CALCIUM 600+D) 600-400 MG-UNIT per tablet Take 1 tablet by mouth 2 (two) times daily. @lunch  and in the evening  . cholecalciferol (VITAMIN D) 1000 UNITS tablet Take 1,000 Units by mouth daily.   . Coenzyme Q10 (CO Q-10) 100 MG CAPS Take 1 capsule by mouth every morning.   . fish oil-omega-3 fatty acids 1000 MG capsule Take 1 g by mouth every morning.   Marland Kitchen FLAXSEED, LINSEED, PO  Take 1,000 mg by mouth daily.  . Magnesium 250 MG TABS Take 1 tablet by mouth every evening.   . Multiple Vitamins-Minerals (MULTIVITAMIN WITH MINERALS) tablet Take 1 tablet by mouth every morning.   . simvastatin (ZOCOR) 20 MG tablet TAKE 1 TABLET BY MOUTH AT BEDTIME  . vitamin E 400 UNIT capsule Take 400 Units by mouth every morning.    No facility-administered encounter medications on file as of 11/09/2018.     Allergy:  Allergies  Allergen Reactions  . Latex Rash    Social Hx:   Social History   Socioeconomic History  . Marital status: Single    Spouse name: Not on file  . Number of children: 0  . Years of education: Not on file  . Highest education level: Not on file  Occupational History  . Occupation: retired Archivist  . Financial resource strain: Not on file  . Food insecurity:    Worry: Not on file    Inability: Not on file  . Transportation needs:    Medical: Not on file    Non-medical: Not on file  Tobacco Use  . Smoking status: Never Smoker  . Smokeless tobacco: Never Used  Substance and Sexual Activity  . Alcohol use: Yes    Comment: rarely  . Drug use: No  . Sexual activity: Never    Birth control/protection: Post-menopausal  Lifestyle  . Physical activity:    Days per week: Not on file    Minutes per session: Not on file  .  Stress: Not on file  Relationships  . Social connections:    Talks on phone: Not on file    Gets together: Not on file    Attends religious service: Not on file    Active member of club or organization: Not on file    Attends meetings of clubs or organizations: Not on file    Relationship status: Not on file  . Intimate partner violence:    Fear of current or ex partner: Not on file    Emotionally abused: Not on file    Physically abused: Not on file    Forced sexual activity: Not on file  Other Topics Concern  . Not on file  Social History Narrative  . Not on file    Past Surgical Hx:  Past Surgical  History:  Procedure Laterality Date  . BREAST SURGERY     left breast bx - benign  . COLONOSCOPY  2017  . DILATATION & CURETTAGE/HYSTEROSCOPY WITH TRUECLEAR N/A 05/17/2014   Procedure: HYSTEROSCOPY WITH POLYPECTOMY;  Surgeon: Claiborne Billings A. Pamala Hurry, MD;  Location: [facility];  Service: Gynecology;  Laterality: N/A;  . LYMPH NODE DISSECTION N/A 06/05/2014   Procedure: LYMPH NODE DISSECTION;  Surgeon: Imagene Gurney A. Alycia Rossetti, MD;  Location: [facility];  Service: Gynecology;  Laterality: N/A;  . ROBOTIC ASSISTED TOTAL HYSTERECTOMY WITH BILATERAL SALPINGO OOPHERECTOMY N/A 06/05/2014   Procedure: ROBOTIC ASSISTED TOTAL HYSTERECTOMY WITH BILATERAL SALPINGO OOPHORECTOMY;  Surgeon: Imagene Gurney A. Alycia Rossetti, MD;  Location: [facility];  Service: Gynecology;  Laterality: N/A;  . tmj mandibular advancement    . TONSILLECTOMY     age 59  . WISDOM TOOTH EXTRACTION      Past Medical Hx:  Past Medical History:  Diagnosis Date  . Endometrial cancer ([place])    2015  . Heart murmur    as child, no problems   . Hyperlipidemia   . Hypertension   . Radiation 08/21/14, 08/28/14, 09/04/14, 09/06/14, 09/18/14   vaginal cuff brachytherapy 30 gray  . Seasonal allergies     Oncology Hx:    Endometrial ca ([place])   05/17/2014 Initial Diagnosis    Endometrial ca    06/05/2014 Surgery    TRH/BSO and staging. IAUPSC 0/12 nodes, no LVSI    07/12/2014 -  Chemotherapy    paclitaxel and carboplatin    08/21/2014 - 09/18/2014 Radiation Therapy        Uterine cancer ([place]) (Resolved)   05/17/2014 Initial Diagnosis    Uterine cancer     - 11/01/2014 Chemotherapy    Completed 6 cycles of paclitaxel and carboplatin based chemotherapy    06/05/2014 Surgery    IA UPSC     Radiation Therapy        Family Hx:  Family History  Problem Relation Age of Onset  . Mental retardation Mother   . Kidney disease Father   . Stroke Father   . Cancer Maternal Uncle        unknown  . Breast cancer Maternal Aunt   . Bone cancer Paternal Uncle   . Breast cancer  Cousin     Vitals:  Blood pressure (!) 141/95, pulse 86, temperature 98.1 F (36.7 C), temperature source Oral, height 5\' 3"  (1.6 m), weight 122 lb 3.2 oz (55.4 kg), SpO2 100 %.  Physical Exam: Well-nourished well-developed female in no acute distress.  Neck: Supple, no lymphadenopathy, no thyromegaly.  Abdomen: Soft, nontender, nondistended. There are no palpable masses or hepatosplenomegaly. Well-healed surgical incisions with no evidence of any incisional  hernias.  Extremities: No edema.  Groins: No lymphadenopathy.  Pelvic: Normal female genitalia. The vagina is markedly atrophic. The vaginal cuff is without lesions.  Pap smears submitted without difficulty.  Bimanual examination reveals no tenderness, fluctuance or masses. Rectal confirms  Assessment/Plan: 73 year old with a stage IA grade 3 uterine serous carcinoma of the endometrium diagnosed in December 2015. Her preoperative staging was negative. Her surgical staging was similarly negative. She's been treated adjuvantly with dose dense paclitaxel and carboplatin. She's completed all of her therapy with her last cycle of chemotherapy being Nov 01, 2014. She's had a negative post-treatment CT scan.   -We will follow-up on the results of her Pap smear from today. -She will return to see [place] in 6 months.  That will mark her 5-year anniversary.     Marland KitchenGEHRIG,Betzabeth Derringer A., MD 11/09/2018, 11:41 AM

## 2018-11-09 NOTE — Patient Instructions (Addendum)
We will notify you of your results from today.  Please return to see [place] in 6 months and that we will mark your 5-year anniversary. Remember to call us in July to schedule that November appointment.

## 2018-11-10 ENCOUNTER — Ambulatory Visit: Payer: Self-pay | Admitting: Gynecologic Oncology

## 2018-11-10 NOTE — Addendum Note (Signed)
Addended by: [name] D on: 11/10/2018 11:42 AM   Modules accepted: Orders

## 2018-11-14 LAB — CYTOLOGY - PAP: Diagnosis: NEGATIVE

## 2018-11-15 ENCOUNTER — Telehealth: Payer: Self-pay

## 2018-11-15 NOTE — Telephone Encounter (Signed)
Told Annette Tucker that the pap smear was normal. It shows benign radiation changes per [name], NP. Pt verbalized understanding.

## 2018-11-25 ENCOUNTER — Ambulatory Visit: Payer: Self-pay | Admitting: Obstetrics

## 2018-12-21 DIAGNOSIS — R69 Illness, unspecified: Secondary | ICD-10-CM | POA: Diagnosis not present

## 2019-01-03 ENCOUNTER — Telehealth: Payer: Self-pay | Admitting: *Deleted

## 2019-01-03 NOTE — Telephone Encounter (Signed)
Called and spoke with the patient regarding her follow up appt for November. Explained that the schedule isn't out yet; we will call her once the schedule is open.

## 2019-02-28 ENCOUNTER — Telehealth: Payer: Self-pay | Admitting: *Deleted

## 2019-02-28 NOTE — Telephone Encounter (Signed)
Patient called and scheduled her follow up

## 2019-04-04 ENCOUNTER — Other Ambulatory Visit: Payer: Self-pay

## 2019-04-04 ENCOUNTER — Other Ambulatory Visit: Payer: Medicare HMO | Admitting: Internal Medicine

## 2019-04-04 DIAGNOSIS — E7849 Other hyperlipidemia: Secondary | ICD-10-CM

## 2019-04-04 DIAGNOSIS — Z0000 Encounter for general adult medical examination without abnormal findings: Secondary | ICD-10-CM

## 2019-04-05 LAB — CBC WITH DIFFERENTIAL/PLATELET
Absolute Monocytes: 564 cells/uL (ref 200–950)
Basophils Absolute: 20 cells/uL (ref 0–200)
Basophils Relative: 0.4 %
Eosinophils Absolute: 108 cells/uL (ref 15–500)
Eosinophils Relative: 2.2 %
HCT: 41.5 % (ref 35.0–45.0)
Hemoglobin: 14.1 g/dL (ref 11.7–15.5)
Lymphs Abs: 1303 cells/uL (ref 850–3900)
MCH: 30.5 pg (ref 27.0–33.0)
MCHC: 34 g/dL (ref 32.0–36.0)
MCV: 89.8 fL (ref 80.0–100.0)
MPV: 10.8 fL (ref 7.5–12.5)
Monocytes Relative: 11.5 %
Neutro Abs: 2906 cells/uL (ref 1500–7800)
Neutrophils Relative %: 59.3 %
Platelets: 237 10*3/uL (ref 140–400)
RBC: 4.62 10*6/uL (ref 3.80–5.10)
RDW: 13 % (ref 11.0–15.0)
Total Lymphocyte: 26.6 %
WBC: 4.9 10*3/uL (ref 3.8–10.8)

## 2019-04-05 LAB — COMPREHENSIVE METABOLIC PANEL
AG Ratio: 1.7 (calc) (ref 1.0–2.5)
ALT: 15 U/L (ref 6–29)
AST: 20 U/L (ref 10–35)
Albumin: 4.6 g/dL (ref 3.6–5.1)
Alkaline phosphatase (APISO): 70 U/L (ref 37–153)
BUN: 15 mg/dL (ref 7–25)
CO2: 28 mmol/L (ref 20–32)
Calcium: 9.8 mg/dL (ref 8.6–10.4)
Chloride: 102 mmol/L (ref 98–110)
Creat: 0.75 mg/dL (ref 0.60–0.93)
Globulin: 2.7 g/dL (calc) (ref 1.9–3.7)
Glucose, Bld: 95 mg/dL (ref 65–99)
Potassium: 4.5 mmol/L (ref 3.5–5.3)
Sodium: 140 mmol/L (ref 135–146)
Total Bilirubin: 0.5 mg/dL (ref 0.2–1.2)
Total Protein: 7.3 g/dL (ref 6.1–8.1)

## 2019-04-05 LAB — LIPID PANEL
Cholesterol: 196 mg/dL (ref <200)
HDL: 73 mg/dL (ref >=50)
LDL Cholesterol (Calc): 107 mg/dL (calc) — ABNORMAL HIGH
Non-HDL Cholesterol (Calc): 123 mg/dL (calc) (ref <130)
Total CHOL/HDL Ratio: 2.7 (calc) (ref <5.0)
Triglycerides: 72 mg/dL (ref <150)

## 2019-04-05 LAB — TSH: TSH: 2.98 mIU/L (ref 0.40–4.50)

## 2019-04-06 ENCOUNTER — Encounter: Payer: Self-pay | Admitting: Internal Medicine

## 2019-04-06 ENCOUNTER — Other Ambulatory Visit: Payer: Self-pay

## 2019-04-06 ENCOUNTER — Ambulatory Visit (INDEPENDENT_AMBULATORY_CARE_PROVIDER_SITE_OTHER): Payer: Medicare HMO | Admitting: Internal Medicine

## 2019-04-06 VITALS — BP 100/80 | HR 91 | Temp 98.0°F | Ht 63.0 in | Wt 123.0 lb

## 2019-04-06 DIAGNOSIS — I10 Essential (primary) hypertension: Secondary | ICD-10-CM | POA: Diagnosis not present

## 2019-04-06 DIAGNOSIS — Z8542 Personal history of malignant neoplasm of other parts of uterus: Secondary | ICD-10-CM

## 2019-04-06 DIAGNOSIS — T451X5A Adverse effect of antineoplastic and immunosuppressive drugs, initial encounter: Secondary | ICD-10-CM

## 2019-04-06 DIAGNOSIS — Z0000 Encounter for general adult medical examination without abnormal findings: Secondary | ICD-10-CM | POA: Diagnosis not present

## 2019-04-06 DIAGNOSIS — G620 Drug-induced polyneuropathy: Secondary | ICD-10-CM | POA: Diagnosis not present

## 2019-04-06 DIAGNOSIS — M8580 Other specified disorders of bone density and structure, unspecified site: Secondary | ICD-10-CM

## 2019-04-06 DIAGNOSIS — Z23 Encounter for immunization: Secondary | ICD-10-CM

## 2019-04-06 LAB — POCT URINALYSIS DIPSTICK
Appearance: NEGATIVE
Bilirubin, UA: NEGATIVE
Blood, UA: NEGATIVE
Glucose, UA: NEGATIVE
Ketones, UA: NEGATIVE
Leukocytes, UA: NEGATIVE
Nitrite, UA: NEGATIVE
Odor: NEGATIVE
Protein, UA: NEGATIVE
Spec Grav, UA: 1.01 (ref 1.010–1.025)
Urobilinogen, UA: 0.2 E.U./dL
pH, UA: 7.5 (ref 5.0–8.0)

## 2019-04-13 ENCOUNTER — Encounter: Payer: Self-pay | Admitting: Internal Medicine

## 2019-04-13 DIAGNOSIS — M8589 Other specified disorders of bone density and structure, multiple sites: Secondary | ICD-10-CM | POA: Diagnosis not present

## 2019-04-13 DIAGNOSIS — Z90710 Acquired absence of both cervix and uterus: Secondary | ICD-10-CM | POA: Diagnosis not present

## 2019-04-13 DIAGNOSIS — R29890 Loss of height: Secondary | ICD-10-CM | POA: Diagnosis not present

## 2019-04-13 DIAGNOSIS — Z1231 Encounter for screening mammogram for malignant neoplasm of breast: Secondary | ICD-10-CM | POA: Diagnosis not present

## 2019-04-13 DIAGNOSIS — Z803 Family history of malignant neoplasm of breast: Secondary | ICD-10-CM | POA: Diagnosis not present

## 2019-04-13 DIAGNOSIS — S92901D Unspecified fracture of right foot, subsequent encounter for fracture with routine healing: Secondary | ICD-10-CM | POA: Diagnosis not present

## 2019-04-13 DIAGNOSIS — Z8542 Personal history of malignant neoplasm of other parts of uterus: Secondary | ICD-10-CM | POA: Diagnosis not present

## 2019-04-13 LAB — HM MAMMOGRAPHY

## 2019-04-17 ENCOUNTER — Encounter: Payer: Self-pay | Admitting: Internal Medicine

## 2019-04-20 DIAGNOSIS — H5203 Hypermetropia, bilateral: Secondary | ICD-10-CM | POA: Diagnosis not present

## 2019-04-20 DIAGNOSIS — Z0100 Encounter for examination of eyes and vision without abnormal findings: Secondary | ICD-10-CM | POA: Diagnosis not present

## 2019-04-20 DIAGNOSIS — H52223 Regular astigmatism, bilateral: Secondary | ICD-10-CM | POA: Diagnosis not present

## 2019-04-20 DIAGNOSIS — H40013 Open angle with borderline findings, low risk, bilateral: Secondary | ICD-10-CM | POA: Diagnosis not present

## 2019-04-20 DIAGNOSIS — H2513 Age-related nuclear cataract, bilateral: Secondary | ICD-10-CM | POA: Diagnosis not present

## 2019-04-20 DIAGNOSIS — H524 Presbyopia: Secondary | ICD-10-CM | POA: Diagnosis not present

## 2019-04-26 DIAGNOSIS — R921 Mammographic calcification found on diagnostic imaging of breast: Secondary | ICD-10-CM | POA: Diagnosis not present

## 2019-04-26 DIAGNOSIS — Z803 Family history of malignant neoplasm of breast: Secondary | ICD-10-CM | POA: Diagnosis not present

## 2019-04-27 ENCOUNTER — Encounter: Payer: Self-pay | Admitting: Internal Medicine

## 2019-04-29 NOTE — Progress Notes (Signed)
Subjective:    Patient ID: Annette Tucker, female    DOB: 08-30-1945, 73 y.o.   MRN: [id number]  HPI 73 year old female in today for routine health maintenance, Medicare wellness and evaluation of medical issues.  History of hypertension which is well controlled.  History of small retinal hemorrhage and follow-up 2019 seen by Dr. Marica Otter.  In 2015 she noted vaginal spotting.  She had a D&C that revealed endometrial cancer and was referred to GYN oncologist.  Subsequently underwent robotic assisted TAH/BSO with lymph node dissection.  Lymph nodes were negative.  Stage was 1A.  She had a high-grade serous endometrial carcinoma.  She received radiation chemotherapy.  Chemotherapy was with Taxol and carboplatin.  She feels well and is doing well and continues to be followed by GYN oncology.  History of osteopenia and osteoarthritis.  History of hyperlipidemia.  Stress fracture of right foot seen by Dr. Durward Fortes March 2019.  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  Was diagnosed with mitral valve prolapse 2011 on echocardiogram.  Takes vitamin D supplement.  Took Fosamax for couple of years for osteopenia.  Zostavax vaccine in September 2011.  Tetanus immunizations up-to-date.  Gets annual mammogram.  Tonsillectomy at age 58, TMJ mandibular advancement surgery 1986.  Colonoscopy September 2006 and again in September 2016.  Social history: She completed college and worked in Scientist, research (medical) for a number of years.  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  She is a native of [place].  She is single.  Does not smoke or consume alcohol.  History: Mother with history of Alzheimer's dementia.  Father died at age 11 with kidney disease with history of MI hypertension and CVA.  1 brother with hyperlipidemia.    Review of Systems  HENT: Negative.   Respiratory: Negative.   Cardiovascular: Negative.   Gastrointestinal:  Negative.   Genitourinary: Negative.   Neurological:       Mild peripheral neuropathy due to chemotherapy  Psychiatric/Behavioral: Negative.   All other systems reviewed and are negative.      Objective:   Physical Exam Vitals signs reviewed.   Blood pressure 100/80, pulse 91, temperature 98 degrees orally pulse oximetry 99% weight 123 pounds height 5 feet 3 inches BMI 21.79.  Skin warm and dry.  Nodes none.  TMs and pharynx are clear.  Neck is supple without JVD thyromegaly or carotid bruits.  Chest is clear to auscultation without rales or wheezing.  Breast without masses.  Cardiac exam regular rate and rhythm normal S1 and S2 without murmurs or gallops.  No click appreciated.  Abdomen no hepatosplenomegaly masses or tenderness.  No lower extremity edema.  Neuro intact without focal deficits.  Thought, judgment, affect are normal.        Assessment & Plan:  Essential hypertension stable and under good control on current regimen  History of endometrial cancer with no evidence of recurrence followed by GYN oncology  Osteopenia  Stable osteoarthritis  Remote history of mitral valve prolapse diagnosed in 2001 on echocardiogram  Retinal hemorrhage followed by Dr. Marica Otter.  First occurred 2019.  Plan: Continue to monitor blood pressure regularly as she has been doing a great job of Bu[place].  Return in 1 year or as needed.  Subjective:   Patient presents for Medicare Annual/Subsequent preventive examination.  Review Past Medical/Family/Social: See above   Risk Factors  Current exercise habits: Gets regular exercise Dietary issues discussed: Low-salt low-fat low carbohydrate  Cardiac risk factors: Family history with father of MI, patient has history of hyperlipidemia but LDL is only 107 on statin medication  Depression Screen  (Note: if answer to either of the following is "Yes", a more complete depression screening is indicated)   Over the past two weeks, have you  felt down, depressed or hopeless? No  Over the past two weeks, have you felt little interest or pleasure in doing things? No Have you lost interest or pleasure in daily life? No Do you often feel hopeless? No Do you cry easily over simple problems? No   Activities of Daily Living  In your present state of health, do you have any difficulty performing the following activities?:   Driving? No  Managing money? No  Feeding yourself? No  Getting from bed to chair? No  Climbing a flight of stairs? No  Preparing food and eating?: No  Bathing or showering? No  Getting dressed: No  Getting to the toilet? No  Using the toilet:No  Moving around from place to place: No  In the past year have you fallen or had a near fall?:No  Are you sexually active? No  Do you have more than one partner? No   Hearing Difficulties: No  Do you often ask people to speak up or repeat themselves? No  Do you experience ringing or noises in your ears? No  Do you have difficulty understanding soft or whispered voices? No  Do you feel that you have a problem with memory? No Do you often misplace items? No    Home Safety:  Do you have a smoke alarm at your residence? Yes Do you have grab bars in the bathroom?  None Do you have throw rugs in your house?  No   Cognitive Testing  Alert? Yes Normal Appearance?Yes  Oriented to person? Yes Place? Yes  Time? Yes  Recall of three objects? Yes  Can perform simple calculations? Yes  Displays appropriate judgment?Yes  Can read the correct time from a watch face?Yes   List the Names of Other Physician/Practitioners you currently use:  See referral list for the physicians patient is currently seeing.     Review of Systems: See above   Objective:     General appearance: Appears younger than stated age and trim Head: Normocephalic, without obvious abnormality, atraumatic  Eyes: conj clear, EOMi PEERLA  Ears: normal TM's and external ear canals both ears   Nose: Nares normal. Septum midline. Mucosa normal. No drainage or sinus tenderness.  Throat: lips, mucosa, and tongue normal; teeth and gums normal  Neck: no adenopathy, no carotid bruit, no JVD, supple, symmetrical, trachea midline and thyroid not enlarged, symmetric, no tenderness/mass/nodules  No CVA tenderness.  Lungs: clear to auscultation bilaterally  Breasts: normal appearance, no masses or tenderness, top of the pacemaker on left upper chest. Incision well-healed. It is tender.  Heart: regular rate and rhythm, S1, S2 normal, no murmur, click, rub or gallop  Abdomen: soft, non-tender; bowel sounds normal; no masses, no organomegaly  Musculoskeletal: ROM normal in all joints, no crepitus, no deformity, Normal muscle strengthen. Back  is symmetric, no curvature. Skin: Skin color, texture, turgor normal. No rashes or lesions  Lymph nodes: Cervical, supraclavicular, and axillary nodes normal.  Neurologic: CN 2 -12 Normal, Normal symmetric reflexes. Normal coordination and gait  Psych: Alert & Oriented x 3, Mood appear stable.    Assessment:    Annual wellness medicare exam   Plan:    During the  course of the visit the patient was educated and counseled about appropriate screening and preventive services including:   Annual flu vaccine given.  Pneumococcal immunizations up-to-date.  Defer Shingrix until pandemic is over.  Tetanus immunization is up-to-date.  Had Zostavax in 2011.     Patient Instructions (the written plan) was given to the patient.  Medicare Attestation  I have personally reviewed:  The patient's medical and social history  Their use of alcohol, tobacco or illicit drugs  Their current medications and supplements  The patient's functional ability including ADLs,fall risks, home safety risks, cognitive, and hearing and visual impairment  Diet and physical activities  Evidence for depression or mood disorders  The patient's weight, height, BMI, and visual acuity  have been recorded in the chart. I have made referrals, counseling, and provided education to the patient based on review of the above and I have provided the patient with a written personalized care plan for preventive services.

## 2019-04-29 NOTE — Patient Instructions (Addendum)
It was a pleasure to see you today.  Labs look good and blood pressure control is excellent.  Return in 1 year or as needed.  Bone density study shows T score -1.7 which is only a slight decrease.  Continue to monitor every 2 years

## 2019-05-04 NOTE — Progress Notes (Signed)
Consult Note: Gyn-Onc  Annette Tucker 73 y.o. female  CC:  Chief Complaint  Patient presents with  . Endometrial ca [facility])    HPI: Patient was initially seen in consultation at the request of Dr. Pamala Hurry.  Patient is a 73 year old gravida 0 who began experiencing some low back pain and then spotting starting in August.. She had an exam which showed significant atrophy and hormone cream was prescribed. She then continued to have some spotting and discharge went to see Dr. Pamala Hurry. Ultrasound was performed that revealed a 4 x 3 x 2 centimeter uterus with a 4.8 milliliter endometrial stripe. There were 2 small lesions measuring 0.5 cm each consistent with polyps. She had been on hormone replacement therapy until 2012. She had been on it approximately 10 years. Pathology from her D&C revealed high-grade endometrial cancer with serous features. For this reason that she is referred to [place] today.  Her primary physician is Dr. Tedra Senegal.  06/05/14: Surgery: Total robotic hysterectomy bilateral salpingo-oophorectomy, bilateral pelvic and para-aortic lymph node dissection.   Operative findings: Normal uterus, cervix, adnexal and abdominal survey, No pathologically enlarged nodes identified.   Pathology:  Diagnosis 1. Lymph node, biopsy, right para aortic nodes - ONE LYMPH NODE, NEGATIVE FOR METASTATIC CARCINOMA (0/1). 2. Lymph node, biopsy, left para aortic node - THREE LYMPH NODES, NEGATIVE FOR METASTATIC CARCINOMA (0/3). 3. Lymph nodes, regional resection, right pelvic - FOUR LYMPH NODES, NEGATIVE FOR METASTATIC CARCINOMA (0/4). 4. Lymph nodes, regional resection, left pelvic - FOUR LYMPH NODES, NEGATIVE FOR METASTATIC CARCINOMA (0/4). 5. Uterus +/- tubes/ovaries, neoplastic, with cervix - SUPERFICIALLY INVASIVE SEROUS CARCINOMA, 0.5 CM, CONFINED WITHIN THE INNER HALF OF THE MYOMETRIUM. (20%, no LVIS) - ADJACENT BENIGN ENDOMETRIAL POLYPS WITH NO EVIDENCE OF ATYPIA OR MALIGNANCY. - CERVIX:  BENIGN SQUAMOUS MUCOSA AND ENDOCERVICAL MUCOSA, NO DYSPLASIA OR MALIGNANCY. - BILATERAL OVARIES: BENIGN OVARIAN TISSUE WITH ENDOMETRIOSIS AND ENDOSALPINGIOSIS, NO ATYPIA OR MALIGNANCY. - BILATERAL FALLOPIAN TUBES: BENIGN FALLOPIAN TUBAL TISSUE, NO EVIDENCE OF ATYPIA OR MALIGNANCY. - MYOMETRIUM: LEIOMYOMATA.  We saw her for her postoperative check in the latter part of January 2016. Since that time she's completed 6 cycles of dose dense paclitaxel and carboplatin. She completed day #8 of cycle #6 on Nov 01, 2014. In addition, she's completed her high-dose vaginal cuff brachytherapy with Dr. Sondra Come.  She had a posttreatment CT scan that revealed (12/27/14): FINDINGS: Lower chest: Lung bases are clear.   Hepatobiliary: No focal hepatic lesion. No biliary duct dilatation.Gallbladder is normal. Common bile duct is normal. Pancreas: Pancreas is normal. No ductal dilatation. No pancreatic inflammation. Spleen: Linear hypodense band within the mid spleen extending from the hilum to the capsule (image 12, series 2) most suggestive of a splenic infarction. Splenic laceration could have a similar pattern. Adrenals/urinary tract: Adrenal glands are normal. There is a nonenhancing cyst of the left kidney. Ureters are normal. The bladder is normal. Stomach/Bowel: Stomach, small-bowel, and cecum normal. The colon and rectosigmoid colon normal. There is a large stool ball in the rectum measuring 7 cm in diameter. Vascular/Lymphatic: Abdominal aorta is normal caliber. There is minimal of vascular intimal calcification. Splenic artery appears normal. There is no retroperitoneal lymphadenopathy. No periportal adenopathy. No mesenteric adenopathy. No pelvic lymphadenopathy. Reproductive: Post hysterectomy anatomy. No adnexal abnormality. Musculoskeletal: No aggressive osseous lesion.  Other: No free fluid.  IMPRESSION: 1. No evidence of endometrial carcinoma recurrence within the abdomen or pelvis. 2. New  linear band within the spleen is most suggestive of a splenic infarction.  3. Probable benign left renal cyst. 4. Large stool ball in the rectum.  She had a follow-up CT scan 06/26/2015. It showed no evidence of metastatic disease. She had resolution of the splenic infarct.   She is up-to-date on her mammograms and her bone densities.  Interval History: I last saw her 5/20 at which time her exam and pap smear were unremarkable.  She is overall doing quite well.  She had a breast biopsy yesterday of the right side at [facility].  She should get the results later today she is a little bit nervous.  Other than her hemorrhoids that sometimes bother her she is doing fine.  She keeps her stools soft with magnesium so that there less inflammatory to her hemorrhoids.  She is taking 2 vitamin D a day.  She is back down to 1 calcium a day but does get calcium in her diet with cheese yogurt and vegetables.  She does not drink milk.  She had a recent bone density that showed a small increase in her osteopenia but she does not have osteoporosis.  She suffered a stress fracture in her foot last year and as a result has not been doing her usual walking though she states she needs to get back on her treadmill.  She does have light weights she could do at home to strengthen her bones.  There are no new medical family issues.  Her brother does have a history of coronary disease he was and maybe still is a smoker.  Review of Systems  Constitutional: Denies fever. Skin: No rash Cardiovascular: No chest pain, shortness of breath, or edema  Pulmonary: No cough   Gastro Intestinal: No nausea, vomiting, constipation, or diarrhea reported.  Genitourinary: Denies vaginal bleeding and discharge.  Musculoskeletal: No complaints Neurologic: No neuropathy Psychology: No changes   Current Meds:  Outpatient Encounter Medications as of 05/09/2019  Medication Sig  . amLODipine (NORVASC) 2.5 MG tablet TAKE 1 TABLET BY MOUTH DAILY   . Calcium Carbonate-Vitamin D (CALCIUM 600+D) 600-400 MG-UNIT per tablet Take 1 tablet by mouth 2 (two) times daily. @lunch  and in the evening  . cholecalciferol (VITAMIN D) 1000 UNITS tablet Take 1,000 Units by mouth daily.   . Coenzyme Q10 (CO Q-10) 100 MG CAPS Take 1 capsule by mouth every morning.   . fish oil-omega-3 fatty acids 1000 MG capsule Take 1 g by mouth every morning.   Marland Kitchen FLAXSEED, LINSEED, PO Take 1,000 mg by mouth daily.  . Magnesium 250 MG TABS Take 1 tablet by mouth every evening.   . Multiple Vitamins-Minerals (MULTIVITAMIN WITH MINERALS) tablet Take 1 tablet by mouth every morning.   . simvastatin (ZOCOR) 20 MG tablet TAKE 1 TABLET BY MOUTH AT BEDTIME  . vitamin E 400 UNIT capsule Take 400 Units by mouth every morning.    No facility-administered encounter medications on file as of 05/09/2019.     Allergy:  Allergies  Allergen Reactions  . Latex Rash    Social Hx:   Social History   Socioeconomic History  . Marital status: Single    Spouse name: Not on file  . Number of children: 0  . Years of education: Not on file  . Highest education level: Not on file  Occupational History  . Occupation: retired Archivist  . Financial resource strain: Not on file  . Food insecurity    Worry: Not on file    Inability: Not on file  . Transportation needs  Medical: Not on file    Non-medical: Not on file  Tobacco Use  . Smoking status: Never Smoker  . Smokeless tobacco: Never Used  Substance and Sexual Activity  . Alcohol use: Yes    Comment: rarely  . Drug use: No  . Sexual activity: Never    Birth control/protection: Post-menopausal  Lifestyle  . Physical activity    Days per week: Not on file    Minutes per session: Not on file  . Stress: Not on file  Relationships  . Social Herbalist on phone: Not on file    Gets together: Not on file    Attends religious service: Not on file    Active member of club or organization: Not on  file    Attends meetings of clubs or organizations: Not on file    Relationship status: Not on file  . Intimate partner violence    Fear of current or ex partner: Not on file    Emotionally abused: Not on file    Physically abused: Not on file    Forced sexual activity: Not on file  Other Topics Concern  . Not on file  Social History Narrative  . Not on file    Past Surgical Hx:  Past Surgical History:  Procedure Laterality Date  . BREAST SURGERY     left breast bx - benign  . COLONOSCOPY  2017  . DILATATION & CURETTAGE/HYSTEROSCOPY WITH TRUECLEAR N/A 05/17/2014   Procedure: HYSTEROSCOPY WITH POLYPECTOMY;  Surgeon: Claiborne Billings A. Pamala Hurry, MD;  Location: [facility];  Service: Gynecology;  Laterality: N/A;  . LYMPH NODE DISSECTION N/A 06/05/2014   Procedure: LYMPH NODE DISSECTION;  Surgeon: Imagene Gurney A. Alycia Rossetti, MD;  Location: [facility];  Service: Gynecology;  Laterality: N/A;  . ROBOTIC ASSISTED TOTAL HYSTERECTOMY WITH BILATERAL SALPINGO OOPHERECTOMY N/A 06/05/2014   Procedure: ROBOTIC ASSISTED TOTAL HYSTERECTOMY WITH BILATERAL SALPINGO OOPHORECTOMY;  Surgeon: Imagene Gurney A. Alycia Rossetti, MD;  Location: [facility];  Service: Gynecology;  Laterality: N/A;  . tmj mandibular advancement    . TONSILLECTOMY     age 28  . WISDOM TOOTH EXTRACTION      Past Medical Hx:  Past Medical History:  Diagnosis Date  . Endometrial cancer ([place])    2015  . Heart murmur    as child, no problems   . Hyperlipidemia   . Hypertension   . Radiation 08/21/14, 08/28/14, 09/04/14, 09/06/14, 09/18/14   vaginal cuff brachytherapy 30 gray  . Seasonal allergies     Oncology Hx:  Oncology History  Endometrial ca [facility])  05/17/2014 Initial Diagnosis   Endometrial ca   06/05/2014 Surgery   TRH/BSO and staging. IAUPSC 0/12 nodes, no LVSI   07/12/2014 -  Chemotherapy   paclitaxel and carboplatin   08/21/2014 - 09/18/2014 Radiation Therapy     Uterine cancer ([place]) (Resolved)  05/17/2014 Initial Diagnosis   Uterine cancer    - 11/01/2014  Chemotherapy   Completed 6 cycles of paclitaxel and carboplatin based chemotherapy   06/05/2014 Surgery   IA UPSC    Radiation Therapy       Family Hx:  Family History  Problem Relation Age of Onset  . Mental retardation Mother   . Kidney disease Father   . Stroke Father   . Cancer Maternal Uncle        unknown  . Breast cancer Maternal Aunt   . Bone cancer Paternal Uncle   . Breast cancer Cousin     Vitals:  Blood  pressure (!) 161/97, pulse 88, temperature 98 F (36.7 C), temperature source Temporal, resp. rate 20, height 5\' 2"  (1.575 m), weight 123 lb (55.8 kg), SpO2 100 %.  Physical Exam: Well-nourished well-developed female in no acute distress.  Neck: Supple, no lymphadenopathy, no thyromegaly.  Abdomen: Soft, nontender, nondistended. There are no palpable masses or hepatosplenomegaly. Well-healed surgical incisions with no evidence of any incisional hernias.  Extremities: No edema.  Groins: No lymphadenopathy.  Pelvic: Normal female genitalia. The vagina is markedly atrophic. The vaginal cuff is without lesions.  Bimanual examination reveals no tenderness, fluctuance or masses. Rectal confirms  Assessment/Plan: 73 year old with a stage IA grade 3 uterine serous carcinoma of the endometrium diagnosed in December 2015. Her preoperative staging was negative. Her surgical staging was similarly negative. She's been treated adjuvantly with dose dense paclitaxel and carboplatin. She's completed all of her therapy with her last cycle of chemotherapy being Nov 01, 2014. She's had a negative post-treatment CT scan.   She is near her 5-year anniversary.  She will be released from our clinic.  She would like to follow-up with Dr. Valentino Saxon for her well woman care.  She will let us know what the results are of her breast biopsy later today.  We are happy to help her with referrals if necessary.  We have appreciated the opportunity to partner in the care of this very pleasant  patient.    Marland KitchenGEHRIG,Danyale Ridinger A., MD 05/09/2019, 10:47 AM

## 2019-05-08 ENCOUNTER — Encounter: Payer: Self-pay | Admitting: Internal Medicine

## 2019-05-08 ENCOUNTER — Other Ambulatory Visit: Payer: Self-pay | Admitting: Radiology

## 2019-05-08 DIAGNOSIS — D0511 Intraductal carcinoma in situ of right breast: Secondary | ICD-10-CM | POA: Diagnosis not present

## 2019-05-08 DIAGNOSIS — R921 Mammographic calcification found on diagnostic imaging of breast: Secondary | ICD-10-CM | POA: Diagnosis not present

## 2019-05-09 ENCOUNTER — Other Ambulatory Visit: Payer: Self-pay

## 2019-05-09 ENCOUNTER — Encounter: Payer: Self-pay | Admitting: Gynecologic Oncology

## 2019-05-09 ENCOUNTER — Inpatient Hospital Stay: Payer: Medicare HMO | Attending: Gynecologic Oncology | Admitting: Gynecologic Oncology

## 2019-05-09 VITALS — BP 161/97 | HR 88 | Temp 98.0°F | Resp 20 | Ht 62.0 in | Wt 123.0 lb

## 2019-05-09 DIAGNOSIS — Z90710 Acquired absence of both cervix and uterus: Secondary | ICD-10-CM | POA: Diagnosis not present

## 2019-05-09 DIAGNOSIS — I10 Essential (primary) hypertension: Secondary | ICD-10-CM | POA: Diagnosis not present

## 2019-05-09 DIAGNOSIS — Z79899 Other long term (current) drug therapy: Secondary | ICD-10-CM | POA: Diagnosis not present

## 2019-05-09 DIAGNOSIS — M545 Low back pain: Secondary | ICD-10-CM | POA: Diagnosis not present

## 2019-05-09 DIAGNOSIS — Z923 Personal history of irradiation: Secondary | ICD-10-CM | POA: Diagnosis not present

## 2019-05-09 DIAGNOSIS — Z170 Estrogen receptor positive status [ER+]: Secondary | ICD-10-CM | POA: Insufficient documentation

## 2019-05-09 DIAGNOSIS — Z90722 Acquired absence of ovaries, bilateral: Secondary | ICD-10-CM

## 2019-05-09 DIAGNOSIS — E785 Hyperlipidemia, unspecified: Secondary | ICD-10-CM | POA: Insufficient documentation

## 2019-05-09 DIAGNOSIS — D0511 Intraductal carcinoma in situ of right breast: Secondary | ICD-10-CM | POA: Insufficient documentation

## 2019-05-09 DIAGNOSIS — Z9221 Personal history of antineoplastic chemotherapy: Secondary | ICD-10-CM | POA: Diagnosis not present

## 2019-05-09 DIAGNOSIS — C541 Malignant neoplasm of endometrium: Secondary | ICD-10-CM | POA: Insufficient documentation

## 2019-05-09 NOTE — Patient Instructions (Signed)
Congratulations on your 5-year anniversary!.  Please follow-up with Dr. Valentino Saxon in 1 year.  Let us know about the results of your biopsy.  If you ever need anything in the future, please do not hesitate to contact us.

## 2019-05-10 ENCOUNTER — Encounter: Payer: Self-pay | Admitting: *Deleted

## 2019-05-10 ENCOUNTER — Other Ambulatory Visit: Payer: Self-pay | Admitting: *Deleted

## 2019-05-10 DIAGNOSIS — D0511 Intraductal carcinoma in situ of right breast: Secondary | ICD-10-CM

## 2019-05-11 ENCOUNTER — Telehealth: Payer: Self-pay | Admitting: Hematology and Oncology

## 2019-05-11 NOTE — Telephone Encounter (Signed)
Spoke to patient to confirm morning [facility] appointment for 11/18, packet already sent

## 2019-05-16 NOTE — Progress Notes (Signed)
[place] NOTE  Patient Care Team: Baxley, Cresenciano Lick, MD as PCP - General (Internal Medicine) Mauro Kaufmann, RN as Oncology Nurse Navigator Rockwell Germany, RN as Oncology Nurse Navigator Rolm Bookbinder, MD as Consulting Physician (General Surgery) Nicholas Lose, MD as Consulting Physician (Hematology and Oncology) Gery Pray, MD as Consulting Physician (Radiation Oncology)  CHIEF COMPLAINTS/PURPOSE OF CONSULTATION:  Newly diagnosed breast cancer  HISTORY OF PRESENTING ILLNESS:  Annette Tucker 73 y.o. female is here because of recent diagnosis of ductal carcinoma in situ of the right breast. The cancer was detected on a routine screening mammogram. [place] on 04/26/19 showed a 0.7cm group of calcifications in the upper outer right breast, a 0.5cm asymmetry in the outer right breast, and no axillary adenopathy. Biopsy on 05/08/19 showed ductal carcinoma in situ with calcifications, intermediate to high grade, ER+ 90%, PR- 0%. She presents to the clinic today for initial evaluation and discussion of treatment options.   I reviewed her records extensively and collaborated the history with the patient.  SUMMARY OF ONCOLOGIC HISTORY: Oncology History  Endometrial ca [facility])  05/17/2014 Initial Diagnosis   Endometrial ca   06/05/2014 Surgery   TRH/BSO and staging. IAUPSC 0/12 nodes, no LVSI   07/12/2014 -  Chemotherapy   paclitaxel and carboplatin   08/21/2014 - 09/18/2014 Radiation Therapy     Uterine cancer ([place]) (Resolved)  05/17/2014 Initial Diagnosis   Uterine cancer    - 11/01/2014 Chemotherapy   Completed 6 cycles of paclitaxel and carboplatin based chemotherapy   06/05/2014 Surgery   IA UPSC    Radiation Therapy     Ductal carcinoma in situ (DCIS) of right breast  05/10/2019 Initial Diagnosis   Routine screening mammogram detected a 0.7cm group of calcifications in the upper outer right breast, a 0.5cm asymmetry in the outer right breast, and no  axillary adenopathy. Biopsy DCIS with calcifications, intermediate to high grade, ER+ 90%, PR- 0%.     MEDICAL HISTORY:  Past Medical History:  Diagnosis Date  . Endometrial cancer ([place])    2015  . Heart murmur    as child, no problems   . Hyperlipidemia   . Hypertension   . Radiation 08/21/14, 08/28/14, 09/04/14, 09/06/14, 09/18/14   vaginal cuff brachytherapy 30 gray  . Seasonal allergies     SURGICAL HISTORY: Past Surgical History:  Procedure Laterality Date  . BREAST SURGERY     left breast bx - benign  . COLONOSCOPY  2017  . DILATATION & CURETTAGE/HYSTEROSCOPY WITH TRUECLEAR N/A 05/17/2014   Procedure: HYSTEROSCOPY WITH POLYPECTOMY;  Surgeon: Claiborne Billings A. Pamala Hurry, MD;  Location: [facility];  Service: Gynecology;  Laterality: N/A;  . LYMPH NODE DISSECTION N/A 06/05/2014   Procedure: LYMPH NODE DISSECTION;  Surgeon: Imagene Gurney A. Alycia Rossetti, MD;  Location: [facility];  Service: Gynecology;  Laterality: N/A;  . ROBOTIC ASSISTED TOTAL HYSTERECTOMY WITH BILATERAL SALPINGO OOPHERECTOMY N/A 06/05/2014   Procedure: ROBOTIC ASSISTED TOTAL HYSTERECTOMY WITH BILATERAL SALPINGO OOPHORECTOMY;  Surgeon: Imagene Gurney A. Alycia Rossetti, MD;  Location: [facility];  Service: Gynecology;  Laterality: N/A;  . tmj mandibular advancement    . TONSILLECTOMY     age 40  . WISDOM TOOTH EXTRACTION      SOCIAL HISTORY: Social History   Socioeconomic History  . Marital status: Single    Spouse name: Not on file  . Number of children: 0  . Years of education: Not on file  . Highest education level: Not on file  Occupational History  .  Occupation: retired Archivist  . Financial resource strain: Not on file  . Food insecurity    Worry: Not on file    Inability: Not on file  . Transportation needs    Medical: Not on file    Non-medical: Not on file  Tobacco Use  . Smoking status: Never Smoker  . Smokeless tobacco: Never Used  Substance and Sexual Activity  . Alcohol use: Not Currently    Comment: rarely  . Drug use: No   . Sexual activity: Never    Birth control/protection: Post-menopausal  Lifestyle  . Physical activity    Days per week: Not on file    Minutes per session: Not on file  . Stress: Not on file  Relationships  . Social Herbalist on phone: Not on file    Gets together: Not on file    Attends religious service: Not on file    Active member of club or organization: Not on file    Attends meetings of clubs or organizations: Not on file    Relationship status: Not on file  . Intimate partner violence    Fear of current or ex partner: Not on file    Emotionally abused: Not on file    Physically abused: Not on file    Forced sexual activity: Not on file  Other Topics Concern  . Not on file  Social History Narrative  . Not on file    FAMILY HISTORY: Family History  Problem Relation Age of Onset  . Mental retardation Mother   . Kidney disease Father   . Stroke Father   . Cancer Maternal Uncle        unknown  . Breast cancer Maternal Aunt   . Bone cancer Paternal Uncle   . Breast cancer Cousin     ALLERGIES:  is allergic to latex.  MEDICATIONS:  Current Outpatient Medications  Medication Sig Dispense Refill  . amLODipine (NORVASC) 2.5 MG tablet TAKE 1 TABLET BY MOUTH DAILY 90 tablet 3  . Ascorbic Acid (VITAMIN C) 100 MG tablet Take 500 mg by mouth daily.    . Calcium Carbonate-Vitamin D (CALCIUM 600+D) 600-400 MG-UNIT per tablet Take 1 tablet by mouth 2 (two) times daily. @lunch  and in the evening    . cholecalciferol (VITAMIN D) 1000 UNITS tablet Take 1,000 Units by mouth daily.     . Coenzyme Q10 (CO Q-10) 100 MG CAPS Take 1 capsule by mouth every morning.     . fish oil-omega-3 fatty acids 1000 MG capsule Take 1 g by mouth every morning.     Marland Kitchen FLAXSEED, LINSEED, PO Take 1,000 mg by mouth daily.    . Magnesium 250 MG TABS Take 1 tablet by mouth every evening.     . Multiple Vitamins-Minerals (MULTIVITAMIN WITH MINERALS) tablet Take 1 tablet by mouth every  morning.     . simvastatin (ZOCOR) 20 MG tablet TAKE 1 TABLET BY MOUTH AT BEDTIME 90 tablet 1  . vitamin E 400 UNIT capsule Take 400 Units by mouth every morning.     . zinc gluconate 50 MG tablet Take 50 mg by mouth daily.     No current facility-administered medications for this visit.     REVIEW OF SYSTEMS:   Constitutional: Denies fevers, chills or abnormal night sweats Eyes: Denies blurriness of vision, double vision or watery eyes Ears, nose, mouth, throat, and face: Denies mucositis or sore throat Respiratory: Denies cough, dyspnea or  wheezes Cardiovascular: Denies palpitation, chest discomfort or lower extremity swelling Gastrointestinal:  Denies nausea, heartburn or change in bowel habits Skin: Denies abnormal skin rashes Lymphatics: Denies new lymphadenopathy or easy bruising Neurological:Denies numbness, tingling or new weaknesses Behavioral/Psych: Mood is stable, no new changes  Breast: Denies any palpable lumps or discharge All other systems were reviewed with the patient and are negative.  PHYSICAL EXAMINATION: ECOG PERFORMANCE STATUS: 1 - Symptomatic but completely ambulatory  Vitals:   05/17/19 0926  BP: (!) 151/82  Pulse: 81  Resp: 17  Temp: 97.8 F (36.6 C)  SpO2: 98%   Filed Weights   05/17/19 0926  Weight: 122 lb 11.2 oz (55.7 kg)    GENERAL:alert, no distress and comfortable SKIN: skin color, texture, turgor are normal, no rashes or significant lesions EYES: normal, conjunctiva are pink and non-injected, sclera clear OROPHARYNX:no exudate, no erythema and lips, buccal mucosa, and tongue normal  NECK: supple, thyroid normal size, non-tender, without nodularity LYMPH:  no palpable lymphadenopathy in the cervical, axillary or inguinal LUNGS: clear to auscultation and percussion with normal breathing effort HEART: regular rate & rhythm and no murmurs and no lower extremity edema ABDOMEN:abdomen soft, non-tender and normal bowel sounds  Musculoskeletal:no cyanosis of digits and no clubbing  PSYCH: alert & oriented x 3 with fluent speech NEURO: no focal motor/sensory deficits BREAST: No palpable nodules in breast. No palpable axillary or supraclavicular lymphadenopathy (exam performed in the presence of a chaperone)   LABORATORY DATA:  I have reviewed the data as listed Lab Results  Component Value Date   WBC 4.7 05/17/2019   HGB 14.1 05/17/2019   HCT 42.0 05/17/2019   MCV 90.7 05/17/2019   PLT 230 05/17/2019   Lab Results  Component Value Date   NA 141 05/17/2019   K 4.1 05/17/2019   CL 103 05/17/2019   CO2 26 05/17/2019    RADIOGRAPHIC STUDIES: I have personally reviewed the radiological reports and agreed with the findings in the report.  ASSESSMENT AND PLAN:  Ductal carcinoma in situ (DCIS) of right breast 05/10/2019:Routine screening mammogram detected a 0.7cm group of calcifications in the upper outer right breast, a 0.5cm asymmetry in the outer right breast, and no axillary adenopathy. Biopsy DCIS with calcifications, intermediate to high grade, ER+ 90%, PR- 0%.  Pathology review: I discussed with the patient the difference between DCIS and invasive breast cancer. It is considered a precancerous lesion. DCIS is classified as a 0. It is generally detected through mammograms as calcifications. We discussed the significance of grades and its impact on prognosis. We also discussed the importance of ER and PR receptors and their implications to adjuvant treatment options. Prognosis of DCIS dependence on grade, comedo necrosis. It is anticipated that if not treated, 20-30% of DCIS can develop into invasive breast cancer.  Recommendation: 1. Breast conserving surgery 2. Followed by adjuvant radiation therapy 3. Followed by antiestrogen therapy with tamoxifen 5 years Genetic counseling and testing based on endometrial cancer history and aunt with breast cancer.  Tamoxifen counseling: We discussed the risks and  benefits of tamoxifen. These include but not limited to insomnia, hot flashes, mood changes, vaginal dryness, and weight gain. Although rare, serious side effects including endometrial cancer, risk of blood clots were also discussed. We strongly believe that the benefits far outweigh the risks. Patient understands these risks and consented to starting treatment. Planned treatment duration is 5 years.  Return to clinic after surgery to discuss the final pathology report and come up with  an adjuvant treatment plan.  All questions were answered. The patient knows to call the clinic with any problems, questions or concerns.   Rulon Eisenmenger, MD, MPH 05/17/2019    I, Molly Dorshimer, am acting as scribe for Nicholas Lose, MD.  I have reviewed the above documentation for accuracy and completeness, and I agree with the above.

## 2019-05-17 ENCOUNTER — Encounter: Payer: Self-pay | Admitting: Licensed Clinical Social Worker

## 2019-05-17 ENCOUNTER — Inpatient Hospital Stay (HOSPITAL_BASED_OUTPATIENT_CLINIC_OR_DEPARTMENT_OTHER): Payer: Medicare HMO | Admitting: Hematology and Oncology

## 2019-05-17 ENCOUNTER — Inpatient Hospital Stay: Payer: Medicare HMO

## 2019-05-17 ENCOUNTER — Ambulatory Visit (HOSPITAL_BASED_OUTPATIENT_CLINIC_OR_DEPARTMENT_OTHER): Payer: Medicare HMO | Admitting: Licensed Clinical Social Worker

## 2019-05-17 ENCOUNTER — Ambulatory Visit
Admission: RE | Admit: 2019-05-17 | Discharge: 2019-05-17 | Disposition: A | Discharge status: Home / Self Care | Payer: Medicare HMO | Source: Ambulatory Visit | Attending: Radiation Oncology | Admitting: Radiation Oncology

## 2019-05-17 ENCOUNTER — Encounter: Payer: Self-pay | Admitting: Hematology and Oncology

## 2019-05-17 ENCOUNTER — Other Ambulatory Visit: Payer: Self-pay | Admitting: General Surgery

## 2019-05-17 ENCOUNTER — Ambulatory Visit: Payer: Medicare HMO | Admitting: Physical Therapy

## 2019-05-17 ENCOUNTER — Other Ambulatory Visit: Payer: Self-pay

## 2019-05-17 VITALS — BP 151/82 | HR 81 | Temp 97.8°F | Resp 17 | Ht 62.0 in | Wt 122.7 lb

## 2019-05-17 DIAGNOSIS — E785 Hyperlipidemia, unspecified: Secondary | ICD-10-CM | POA: Diagnosis not present

## 2019-05-17 DIAGNOSIS — Z808 Family history of malignant neoplasm of other organs or systems: Secondary | ICD-10-CM | POA: Diagnosis not present

## 2019-05-17 DIAGNOSIS — I10 Essential (primary) hypertension: Secondary | ICD-10-CM | POA: Diagnosis not present

## 2019-05-17 DIAGNOSIS — C541 Malignant neoplasm of endometrium: Secondary | ICD-10-CM

## 2019-05-17 DIAGNOSIS — Z923 Personal history of irradiation: Secondary | ICD-10-CM

## 2019-05-17 DIAGNOSIS — D0511 Intraductal carcinoma in situ of right breast: Secondary | ICD-10-CM

## 2019-05-17 DIAGNOSIS — Z8542 Personal history of malignant neoplasm of other parts of uterus: Secondary | ICD-10-CM | POA: Diagnosis not present

## 2019-05-17 DIAGNOSIS — C540 Malignant neoplasm of isthmus uteri: Secondary | ICD-10-CM

## 2019-05-17 DIAGNOSIS — Z803 Family history of malignant neoplasm of breast: Secondary | ICD-10-CM | POA: Diagnosis not present

## 2019-05-17 DIAGNOSIS — Z170 Estrogen receptor positive status [ER+]: Secondary | ICD-10-CM | POA: Diagnosis not present

## 2019-05-17 DIAGNOSIS — Z90722 Acquired absence of ovaries, bilateral: Secondary | ICD-10-CM | POA: Diagnosis not present

## 2019-05-17 DIAGNOSIS — Z9221 Personal history of antineoplastic chemotherapy: Secondary | ICD-10-CM

## 2019-05-17 DIAGNOSIS — Z90710 Acquired absence of both cervix and uterus: Secondary | ICD-10-CM | POA: Diagnosis not present

## 2019-05-17 DIAGNOSIS — M545 Low back pain: Secondary | ICD-10-CM | POA: Diagnosis not present

## 2019-05-17 DIAGNOSIS — R921 Mammographic calcification found on diagnostic imaging of breast: Secondary | ICD-10-CM | POA: Diagnosis not present

## 2019-05-17 LAB — CMP (CANCER CENTER ONLY)
ALT: 17 U/L (ref 0–44)
AST: 18 U/L (ref 15–41)
Albumin: 4.5 g/dL (ref 3.5–5.0)
Alkaline Phosphatase: 76 U/L (ref 38–126)
Anion gap: 12 (ref 5–15)
BUN: 16 mg/dL (ref 8–23)
CO2: 26 mmol/L (ref 22–32)
Calcium: 9.6 mg/dL (ref 8.9–10.3)
Chloride: 103 mmol/L (ref 98–111)
Creatinine: 0.83 mg/dL (ref 0.44–1.00)
GFR, Est AFR Am: >60 mL/min (ref >60)
GFR, Estimated: >60 mL/min (ref >60)
Glucose, Bld: 87 mg/dL (ref 70–99)
Potassium: 4.1 mmol/L (ref 3.5–5.1)
Sodium: 141 mmol/L (ref 135–145)
Total Bilirubin: 0.4 mg/dL (ref 0.3–1.2)
Total Protein: 7.7 g/dL (ref 6.5–8.1)

## 2019-05-17 LAB — CBC WITH DIFFERENTIAL (CANCER CENTER ONLY)
Abs Immature Granulocytes: 0.02 10*3/uL (ref 0.00–0.07)
Basophils Absolute: 0 10*3/uL (ref 0.0–0.1)
Basophils Relative: 1 %
Eosinophils Absolute: 0.1 10*3/uL (ref 0.0–0.5)
Eosinophils Relative: 1 %
HCT: 42 % (ref 36.0–46.0)
Hemoglobin: 14.1 g/dL (ref 12.0–15.0)
Immature Granulocytes: 0 %
Lymphocytes Relative: 21 %
Lymphs Abs: 1 10*3/uL (ref 0.7–4.0)
MCH: 30.5 pg (ref 26.0–34.0)
MCHC: 33.6 g/dL (ref 30.0–36.0)
MCV: 90.7 fL (ref 80.0–100.0)
Monocytes Absolute: 0.5 10*3/uL (ref 0.1–1.0)
Monocytes Relative: 11 %
Neutro Abs: 3.1 10*3/uL (ref 1.7–7.7)
Neutrophils Relative %: 66 %
Platelet Count: 230 10*3/uL (ref 150–400)
RBC: 4.63 MIL/uL (ref 3.87–5.11)
RDW: 13.2 % (ref 11.5–15.5)
WBC Count: 4.7 10*3/uL (ref 4.0–10.5)
nRBC: 0 % (ref 0.0–0.2)

## 2019-05-17 NOTE — Progress Notes (Signed)
Radiation Oncology         [phone] ________________________________  Multidisciplinary Breast Oncology Clinic [facility]) Initial Outpatient Consultation  Name: Annette Tucker MRN: [id number]  Date: 05/17/2019  DOB: January 31, 1946  [id number], Cresenciano Lick, MD  Rolm Bookbinder, MD   REFERRING PHYSICIAN: Rolm Bookbinder, MD  DIAGNOSIS: The encounter diagnosis was Ductal carcinoma in situ (DCIS) of right breast.  Stage 0, Right Breast UOQ, Ductal Carcinoma in Situ with Calicifications, ER+ / PR-, High grade DCIS    ICD-10-CM   1. Ductal carcinoma in situ (DCIS) of right breast  D05.11     HISTORY OF PRESENT ILLNESS::Annette Tucker is a 73 y.o. female who is presenting to the office today for evaluation of her newly diagnosed breast cancer. She is not accompanied by anyone. She is doing well overall. She has a history of endometrial cancer in 2015 with total robotic hysterectomy bilateral salpingo-oophorectomy and bilateral pelvic and para-aortic lymph node dissection.  The patient was found to have a stage Ia disease, high-grade, serous component.  She proceeded with adjuvant chemotherapy and vaginal brachytherapy.  She recently completed 5 years of follow-up  and has been released by gynecologic oncology.  She had a routine screening mammogram on 04/13/2019 that showed new grouped calcifications in the right breast. She underwent unilateral diagnostic mammography with tomography and right breast ultrasonography at [facility] on 04/26/2019 showing 0.7cm grouped pleomorphic calcifications in the right breast suspicious for malignancy. It also showed a small 5 mm asymmetry in the outer right breast middle depth 5 cm from the nipple. Ultrasound of the right breast on 04/26/2019 showed no sonographic evidence of malignancy in the right axillar or lateral right breast.  Biopsy on 05/08/2019 showed ductal carcinoma in situ with calcifications. Prognostic indicators significant for: estrogen receptor, 90%  positive with strong staining intensity and progesterone receptor, 0% negative.  Menarche: 73 years old Age at first live birth: N/A GP: N/A LMP: N/A Contraceptive: N/A HRT: No   The patient was referred today for presentation in the multidisciplinary conference.  Radiology studies and pathology slides were presented there for review and discussion of treatment options.  A consensus was discussed regarding potential next steps.  PREVIOUS RADIATION THERAPY: Yes, in 2015. February 23, March 1, March 8, March 10, March 22  Site/dose:  Vaginal cuff 30 gray in 5 fractions.  High-dose-rate with iridium 192  PAST MEDICAL HISTORY:  Past Medical History:  Diagnosis Date  . Endometrial cancer ([place])    2015  . Heart murmur    as child, no problems   . Hyperlipidemia   . Hypertension   . Radiation 08/21/14, 08/28/14, 09/04/14, 09/06/14, 09/18/14   vaginal cuff brachytherapy 30 gray  . Seasonal allergies     PAST SURGICAL HISTORY: Past Surgical History:  Procedure Laterality Date  . BREAST SURGERY     left breast bx - benign  . COLONOSCOPY  2017  . DILATATION & CURETTAGE/HYSTEROSCOPY WITH TRUECLEAR N/A 05/17/2014   Procedure: HYSTEROSCOPY WITH POLYPECTOMY;  Surgeon: Claiborne Billings A. Pamala Hurry, MD;  Location: [facility];  Service: Gynecology;  Laterality: N/A;  . LYMPH NODE DISSECTION N/A 06/05/2014   Procedure: LYMPH NODE DISSECTION;  Surgeon: Imagene Gurney A. Alycia Rossetti, MD;  Location: [facility];  Service: Gynecology;  Laterality: N/A;  . ROBOTIC ASSISTED TOTAL HYSTERECTOMY WITH BILATERAL SALPINGO OOPHERECTOMY N/A 06/05/2014   Procedure: ROBOTIC ASSISTED TOTAL HYSTERECTOMY WITH BILATERAL SALPINGO OOPHORECTOMY;  Surgeon: Imagene Gurney A. Alycia Rossetti, MD;  Location: [facility];  Service: Gynecology;  Laterality: N/A;  . tmj  mandibular advancement    . TONSILLECTOMY     age 69  . WISDOM TOOTH EXTRACTION      FAMILY HISTORY:  Family History  Problem Relation Age of Onset  . Mental retardation Mother   . Kidney disease Father   .  Stroke Father   . Cancer Maternal Uncle        unknown  . Breast cancer Maternal Aunt   . Bone cancer Paternal Uncle   . Breast cancer Cousin     SOCIAL HISTORY:  Social History   Socioeconomic History  . Marital status: Single    Spouse name: Not on file  . Number of children: 0  . Years of education: Not on file  . Highest education level: Not on file  Occupational History  . Occupation: retired Archivist  . Financial resource strain: Not on file  . Food insecurity    Worry: Not on file    Inability: Not on file  . Transportation needs    Medical: Not on file    Non-medical: Not on file  Tobacco Use  . Smoking status: Never Smoker  . Smokeless tobacco: Never Used  Substance and Sexual Activity  . Alcohol use: Not Currently    Comment: rarely  . Drug use: No  . Sexual activity: Never    Birth control/protection: Post-menopausal  Lifestyle  . Physical activity    Days per week: Not on file    Minutes per session: Not on file  . Stress: Not on file  Relationships  . Social Herbalist on phone: Not on file    Gets together: Not on file    Attends religious service: Not on file    Active member of club or organization: Not on file    Attends meetings of clubs or organizations: Not on file    Relationship status: Not on file  Other Topics Concern  . Not on file  Social History Narrative  . Not on file    ALLERGIES:  Allergies  Allergen Reactions  . Latex Rash    MEDICATIONS:  Current Outpatient Medications  Medication Sig Dispense Refill  . amLODipine (NORVASC) 2.5 MG tablet TAKE 1 TABLET BY MOUTH DAILY 90 tablet 3  . Ascorbic Acid (VITAMIN C) 100 MG tablet Take 500 mg by mouth daily.    . Calcium Carbonate-Vitamin D (CALCIUM 600+D) 600-400 MG-UNIT per tablet Take 1 tablet by mouth 2 (two) times daily. @lunch  and in the evening    . cholecalciferol (VITAMIN D) 1000 UNITS tablet Take 1,000 Units by mouth daily.     . Coenzyme Q10  (CO Q-10) 100 MG CAPS Take 1 capsule by mouth every morning.     . fish oil-omega-3 fatty acids 1000 MG capsule Take 1 g by mouth every morning.     Marland Kitchen FLAXSEED, LINSEED, PO Take 1,000 mg by mouth daily.    . Magnesium 250 MG TABS Take 1 tablet by mouth every evening.     . Multiple Vitamins-Minerals (MULTIVITAMIN WITH MINERALS) tablet Take 1 tablet by mouth every morning.     . simvastatin (ZOCOR) 20 MG tablet TAKE 1 TABLET BY MOUTH AT BEDTIME 90 tablet 1  . vitamin E 400 UNIT capsule Take 400 Units by mouth every morning.     . zinc gluconate 50 MG tablet Take 50 mg by mouth daily.     No current facility-administered medications for this encounter.     REVIEW OF  SYSTEMS: A 10+ POINT REVIEW OF SYSTEMS WAS OBTAINED including neurology, dermatology, psychiatry, cardiac, respiratory, lymph, extremities, GI, GU, musculoskeletal, constitutional, reproductive, HEENT. On the provided form, she wears glasses. She denies fever, chest pain, cough, shortness of breath, breast pain, and any other symptoms.    PHYSICAL EXAM:  Vitals with BMI 05/17/2019  Height 5\' 2"   Weight 122 lbs 11 oz  BMI [id number]  Systolic [id number]  Diastolic 82  Pulse 81   Lungs are clear to auscultation bilaterally. Heart has regular rate and rhythm. No palpable cervical, supraclavicular, or axillary adenopathy. Abdomen soft, non-tender, normal bowel sounds. Breast: Left breast with no palpable mass, nipple discharge, or bleeding. Periareolar scar from prior benign biopsy. Right breast with biopsy sight in UOQ with some associated bruising. No dominant mass appreciated. No nipple discharge or bleeding.   KPS = 100  100 - Normal; no complaints; no evidence of disease. 90   - Able to carry on normal activity; minor signs or symptoms of disease. 80   - Normal activity with effort; some signs or symptoms of disease. 29   - Cares for self; unable to carry on normal activity or to do active work. 60   - Requires occasional assistance,  but is able to care for most of his personal needs. 50   - Requires considerable assistance and frequent medical care. 67   - Disabled; requires special care and assistance. 79   - Severely disabled; hospital admission is indicated although death not imminent. 32   - Very sick; hospital admission necessary; active supportive treatment necessary. 10   - Moribund; fatal processes progressing rapidly. 0     - Dead  Karnofsky DA, [place], Craver LS and Burchenal [facility] [phone]) The use of the nitrogen mustards in the palliative treatment of carcinoma: with particular reference to bronchogenic carcinoma Cancer 1 [phone]  LABORATORY DATA:  Lab Results  Component Value Date   WBC 4.7 05/17/2019   HGB 14.1 05/17/2019   HCT 42.0 05/17/2019   MCV 90.7 05/17/2019   PLT 230 05/17/2019   Lab Results  Component Value Date   NA 141 05/17/2019   K 4.1 05/17/2019   CL 103 05/17/2019   CO2 26 05/17/2019   Lab Results  Component Value Date   ALT 17 05/17/2019   AST 18 05/17/2019   ALKPHOS 76 05/17/2019   BILITOT 0.4 05/17/2019    PULMONARY FUNCTION TEST:   Recent Review Flowsheet Data    There is no flowsheet data to display.      RADIOGRAPHY: No results found.    IMPRESSION: Stage 0, Right Breast UOQ, Ductal Carcinoma in Situ with Calicifications, ER+ / PR-, High grade DCIS  Patient will be a good candidate for breast conservation with radiotherapy to right breast. We discussed the general course of radiation, potential side effects, and toxicities with radiation and the patient is interested in this approach. Given the patient's relatively young age and intermediate to high grade nature of DCIS, I would still recommend radiation therpay as part of her overall management.   PLAN:  1. Genetics 2. Right lumpectomy 3. Adjuvant Radiation Therapy 4. Aromatase Inhibitor ------------------------------------------------  Blair Promise, PhD, MD  This document serves as a record of  services personally performed by Gery Pray, MD. It was created on his behalf by Clerance Lav, a trained medical scribe. The creation of this record is based on the scribe's personal observations and the provider's statements to them. This document has been checked  and approved by the attending provider.

## 2019-05-17 NOTE — Assessment & Plan Note (Signed)
05/10/2019:Routine screening mammogram detected a 0.7cm group of calcifications in the upper outer right breast, a 0.5cm asymmetry in the outer right breast, and no axillary adenopathy. Biopsy DCIS with calcifications, intermediate to high grade, ER+ 90%, PR- 0%.  Pathology review: I discussed with the patient the difference between DCIS and invasive breast cancer. It is considered a precancerous lesion. DCIS is classified as a 0. It is generally detected through mammograms as calcifications. We discussed the significance of grades and its impact on prognosis. We also discussed the importance of ER and PR receptors and their implications to adjuvant treatment options. Prognosis of DCIS dependence on grade, comedo necrosis. It is anticipated that if not treated, 20-30% of DCIS can develop into invasive breast cancer.  Recommendation: 1. Breast conserving surgery 2. Followed by adjuvant radiation therapy 3. Followed by antiestrogen therapy with tamoxifen 5 years  Tamoxifen counseling: We discussed the risks and benefits of tamoxifen. These include but not limited to insomnia, hot flashes, mood changes, vaginal dryness, and weight gain. Although rare, serious side effects including endometrial cancer, risk of blood clots were also discussed. We strongly believe that the benefits far outweigh the risks. Patient understands these risks and consented to starting treatment. Planned treatment duration is 5 years.  Return to clinic after surgery to discuss the final pathology report and come up with an adjuvant treatment plan.

## 2019-05-17 NOTE — Progress Notes (Signed)
REFERRING PROVIDER: Nicholas Lose, MD [place],  [place] [id number]  PRIMARY PROVIDER:  [name], MD  PRIMARY REASON FOR VISIT:  1. Family history of breast cancer   2. Family history of bone cancer     I connected with Annette Tucker on 05/17/2019 at 11:45 AM EDT by Webex and verified that I am speaking with the correct person using two identifiers.    Patient location: [facility] Provider location: clinic  HISTORY OF PRESENT ILLNESS:   Annette Tucker, a 73 y.o. female, was seen for a [place] cancer genetics consultation at the request of Dr. Lindi Adie due to a personal and family history of cancer.  Ms. Bruck presents to clinic today to discuss the possibility of a hereditary predisposition to cancer, genetic testing, and to further clarify her future cancer risks, as well as potential cancer risks for family members.   In 2015, at the age of 77, Annette Tucker was diagnosed with endometrial cancer. This was treated with TAH BSO, chemotherapy and radiation.   In 2020, at the age of 49, Annette Tucker was diagnosed with DCIS of the right breast, ER+ PR-. The treatment plan includes lumpectomy, radiation and Tamoxifen.   CANCER HISTORY:  Oncology History  Endometrial ca [facility])  05/17/2014 Initial Diagnosis   Endometrial ca   06/05/2014 Surgery   TRH/BSO and staging. IAUPSC 0/12 nodes, no LVSI   07/12/2014 -  Chemotherapy   paclitaxel and carboplatin   08/21/2014 - 09/18/2014 Radiation Therapy     Uterine cancer ([place]) (Resolved)  05/17/2014 Initial Diagnosis   Uterine cancer    - 11/01/2014 Chemotherapy   Completed 6 cycles of paclitaxel and carboplatin based chemotherapy   06/05/2014 Surgery   IA UPSC    Radiation Therapy     Ductal carcinoma in situ (DCIS) of right breast  05/10/2019 Initial Diagnosis   Routine screening mammogram detected a 0.7cm group of calcifications in the upper outer right breast, a 0.5cm asymmetry in the outer right breast, and no axillary  adenopathy. Biopsy DCIS with calcifications, intermediate to high grade, ER+ 90%, PR- 0%.      RISK FACTORS:  Menarche was at age 25.  First live birth at age no children.  OCP use for approximately 0 years.  Ovaries intact: no.  Hysterectomy: yes.  Menopausal status: postmenopausal.  HRT use: 0 years- she reports being unsure and thinks possible did use HRT for short time. Colonoscopy: yes; normal. Mammogram within the last year: yes. Number of breast biopsies: 2.  Past Medical History:  Diagnosis Date  . Endometrial cancer ([place])    2015  . Family history of bone cancer   . Family history of breast cancer   . Heart murmur    as child, no problems   . Hyperlipidemia   . Hypertension   . Radiation 08/21/14, 08/28/14, 09/04/14, 09/06/14, 09/18/14   vaginal cuff brachytherapy 30 gray  . Seasonal allergies     Past Surgical History:  Procedure Laterality Date  . BREAST SURGERY     left breast bx - benign  . COLONOSCOPY  2017  . DILATATION & CURETTAGE/HYSTEROSCOPY WITH TRUECLEAR N/A 05/17/2014   Procedure: HYSTEROSCOPY WITH POLYPECTOMY;  Surgeon: Claiborne Billings A. Pamala Hurry, MD;  Location: [facility];  Service: Gynecology;  Laterality: N/A;  . LYMPH NODE DISSECTION N/A 06/05/2014   Procedure: LYMPH NODE DISSECTION;  Surgeon: Imagene Gurney A. Alycia Rossetti, MD;  Location: [facility];  Service: Gynecology;  Laterality: N/A;  . ROBOTIC ASSISTED TOTAL HYSTERECTOMY WITH  BILATERAL SALPINGO OOPHERECTOMY N/A 06/05/2014   Procedure: ROBOTIC ASSISTED TOTAL HYSTERECTOMY WITH BILATERAL SALPINGO OOPHORECTOMY;  Surgeon: Imagene Gurney A. Alycia Rossetti, MD;  Location: [facility];  Service: Gynecology;  Laterality: N/A;  . tmj mandibular advancement    . TONSILLECTOMY     age 42  . WISDOM TOOTH EXTRACTION      Social History   Socioeconomic History  . Marital status: Single    Spouse name: Not on file  . Number of children: 0  . Years of education: Not on file  . Highest education level: Not on file  Occupational History  . Occupation:  retired Archivist  . Financial resource strain: Not on file  . Food insecurity    Worry: Not on file    Inability: Not on file  . Transportation needs    Medical: Not on file    Non-medical: Not on file  Tobacco Use  . Smoking status: Never Smoker  . Smokeless tobacco: Never Used  Substance and Sexual Activity  . Alcohol use: Not Currently    Comment: rarely  . Drug use: No  . Sexual activity: Never    Birth control/protection: Post-menopausal  Lifestyle  . Physical activity    Days per week: Not on file    Minutes per session: Not on file  . Stress: Not on file  Relationships  . Social Herbalist on phone: Not on file    Gets together: Not on file    Attends religious service: Not on file    Active member of club or organization: Not on file    Attends meetings of clubs or organizations: Not on file    Relationship status: Not on file  Other Topics Concern  . Not on file  Social History Narrative  . Not on file     FAMILY HISTORY:  We obtained a detailed, 4-generation family history.  Significant diagnoses are listed below: Family History  Problem Relation Age of Onset  . Mental retardation Mother   . Kidney disease Father   . Stroke Father   . Cancer Maternal Uncle        unknown  . Breast cancer Maternal Aunt        dx 99s  . Bone cancer Paternal Uncle   . Breast cancer Cousin        pat cousins x2, dx in their 76s  . Bone cancer Maternal Uncle    Annette Tucker does not have children. She has one brother, 18, with no history of cancer. She has 2 nieces.  Annette Tucker mother passed at 47, no history of cancer. Patient had 5 maternal uncles, 3 maternal aunts. One of her aunts had breast cancer diagnosed at an age younger than 72. An uncle had bone cancer. Another uncle had cancer, unsure the type. No known cancers in maternal cousins. Maternal grandmother passed in her 62s, no cancer diagnoses. Maternal grandfather did not have cancer that the  patient is aware of.  Annette Tucker father passed at 65, no history of cancer. Patient had 2 paternal aunts, 1 paternal uncle. Her uncle had bone cancer. He also had 2 daughters, both had breast cancer in their 7s. No known cancers in paternal grandparents, patient has limited info about them since they were deceased before she was born.  Annette Tucker is unaware of previous family history of genetic testing for hereditary cancer risks. Patient's maternal ancestors are of [place] descent, and paternal ancestors are  of [place] descent. There is no reported Ashkenazi Jewish ancestry. There is no known consanguinity.  GENETIC COUNSELING ASSESSMENT: Annette Tucker is a 73 y.o. female with a personal and family history which is somewhat suggestive of a hereditary cancer syndrome and predisposition to cancer. We, therefore, discussed and recommended the following at today's visit.   DISCUSSION: We discussed that 5 - 10% of breast cancer is hereditary, with most cases associated with BRCA1/BRCA2 mutations.  There are other genes that can be associated with hereditary breast cancer syndromes. There are genes that increase the risk for both endometrial and breast cancer as well, and genes associated with other cancers not seen in her family such as prostate, colon, ovarian, etc. We discussed that testing is beneficial for several reasons including surgical decision-making for breast cancer, knowing how to follow individuals after completing their treatment, and understand if other family members could be at risk for cancer and allow them to undergo genetic testing.   We reviewed the characteristics, features and inheritance patterns of hereditary cancer syndromes. We also discussed genetic testing, including the appropriate family members to test, the process of testing, insurance coverage and turn-around-time for results. We discussed the implications of a negative, positive and/or variant of uncertain significant  result. In order to get genetic test results in a timely manner so that Annette Tucker can use these genetic test results for surgical decisions, we recommended Annette Tucker pursue genetic testing for the Breast Cancer STAT Panel. Once complete, we recommend Annette Tucker pursue reflex genetic testing to the Common Hereditary Cancers gene panel.   The STAT Breast cancer panel offered by [facility] includes sequencing and rearrangement analysis for the following 9 genes:  ATM, BRCA1, BRCA2, CDH1, CHEK2, PALB2, PTEN, STK11 and TP53.    The Common Hereditary Cancers Panel offered by [facility] includes sequencing and/or deletion duplication testing of the following 48 genes: APC, ATM, AXIN2, BARD1, BMPR1A, BRCA1, BRCA2, BRIP1, CDH1, CDKN2A (p14ARF), CDKN2A (p16INK4a), CKD4, CHEK2, CTNNA1, DICER1, EPCAM (Deletion/duplication testing only), GREM1 (promoter region deletion/duplication testing only), KIT, MEN1, MLH1, MSH2, MSH3, MSH6, MUTYH, NBN, NF1, NHTL1, PALB2, PDGFRA, PMS2, POLD1, POLE, PTEN, RAD50, RAD51C, RAD51D, RNF43, SDHB, SDHC, SDHD, SMAD4, SMARCA4. STK11, TP53, TSC1, TSC2, and VHL.  The following genes were evaluated for sequence changes only: SDHA and HOXB13 c.251G>A variant only.  Based on Annette Tucker's personal and family history of cancer, she meets medical criteria for genetic testing. Despite that she meets criteria, she may still have an out of pocket cost.   PLAN: After considering the risks, benefits, and limitations, Annette Tucker provided informed consent to pursue genetic testing and the blood sample was sent to [facility] for analysis of the Breast Cancer STAT Panel. Results should be available within approximately 1 weeks' time, at which point they will be disclosed by telephone to Annette Tucker, as will any additional recommendations warranted by these results. Annette Tucker will receive a summary of her genetic counseling visit and a copy of her results once available. This information will also be available  in Epic.   Annette Tucker questions were answered to her satisfaction today. Our contact information was provided should additional questions or concerns arise. Thank you for the referral and allowing [place] to share in the care of your patient.   Faith Rogue, MS, [facility] Genetic Counselor [email or web address] Phone: [phone]  The patient was seen for a total of 20 minutes in virtual genetic counseling.  Drs. Magrinat, Lindi Adie and/or Burr Medico were available for discussion regarding this case.  _______________________________________________________________________ For Office Staff:  Number of people involved in session: 1 Was an Intern/ student involved with case: no

## 2019-05-18 ENCOUNTER — Encounter (HOSPITAL_BASED_OUTPATIENT_CLINIC_OR_DEPARTMENT_OTHER): Payer: Self-pay

## 2019-05-18 ENCOUNTER — Other Ambulatory Visit: Payer: Self-pay

## 2019-05-18 ENCOUNTER — Telehealth: Payer: Self-pay | Admitting: Hematology and Oncology

## 2019-05-18 ENCOUNTER — Telehealth: Payer: Self-pay | Admitting: *Deleted

## 2019-05-18 ENCOUNTER — Other Ambulatory Visit: Payer: Self-pay | Admitting: *Deleted

## 2019-05-18 DIAGNOSIS — D0511 Intraductal carcinoma in situ of right breast: Secondary | ICD-10-CM

## 2019-05-18 NOTE — Telephone Encounter (Signed)
Left vm for pt regarding [place] from 05/17/19. Contact information provided for questions or needs.

## 2019-05-18 NOTE — Telephone Encounter (Signed)
Scheduled appt per 11/19 sch message - unable to reach pt , or leave message. Sent reminder letter in the mail with appt date and time

## 2019-05-19 ENCOUNTER — Other Ambulatory Visit: Payer: Self-pay | Admitting: General Surgery

## 2019-05-19 DIAGNOSIS — D0511 Intraductal carcinoma in situ of right breast: Secondary | ICD-10-CM

## 2019-05-21 ENCOUNTER — Other Ambulatory Visit: Payer: Self-pay | Admitting: Internal Medicine

## 2019-05-22 ENCOUNTER — Other Ambulatory Visit: Payer: Self-pay

## 2019-05-22 ENCOUNTER — Encounter (HOSPITAL_BASED_OUTPATIENT_CLINIC_OR_DEPARTMENT_OTHER)
Admission: RE | Admit: 2019-05-22 | Discharge: 2019-05-22 | Disposition: A | Discharge status: Home / Self Care | Payer: Medicare HMO | Source: Ambulatory Visit | Attending: General Surgery | Admitting: General Surgery

## 2019-05-22 DIAGNOSIS — I10 Essential (primary) hypertension: Secondary | ICD-10-CM | POA: Diagnosis not present

## 2019-05-22 DIAGNOSIS — Z01810 Encounter for preprocedural cardiovascular examination: Secondary | ICD-10-CM | POA: Diagnosis not present

## 2019-05-22 MED ORDER — ENSURE PRE-SURGERY PO LIQD: 296.0000 mL | Freq: Once | ORAL | Status: DC

## 2019-05-22 NOTE — Progress Notes (Signed)

## 2019-05-24 ENCOUNTER — Ambulatory Visit: Payer: Self-pay | Admitting: Licensed Clinical Social Worker

## 2019-05-24 ENCOUNTER — Encounter: Payer: Self-pay | Admitting: Licensed Clinical Social Worker

## 2019-05-24 ENCOUNTER — Telehealth: Payer: Self-pay | Admitting: Licensed Clinical Social Worker

## 2019-05-24 DIAGNOSIS — D0511 Intraductal carcinoma in situ of right breast: Secondary | ICD-10-CM

## 2019-05-24 DIAGNOSIS — Z1379 Encounter for other screening for genetic and chromosomal anomalies: Secondary | ICD-10-CM

## 2019-05-24 DIAGNOSIS — C541 Malignant neoplasm of endometrium: Secondary | ICD-10-CM

## 2019-05-24 DIAGNOSIS — Z803 Family history of malignant neoplasm of breast: Secondary | ICD-10-CM

## 2019-05-24 DIAGNOSIS — Z808 Family history of malignant neoplasm of other organs or systems: Secondary | ICD-10-CM

## 2019-05-24 NOTE — Telephone Encounter (Signed)
Revealed negative genetic testing.  Revealed that a VUS in MSH2 was identified.  We discussed that we do not know why she has breast cancer or why there is cancer in the family. It could be due to a different gene that we are not testing, or something our current technology cannot pick up.  It will be important for her to keep in contact with genetics to learn if additional testing may be needed in the future.

## 2019-05-24 NOTE — Progress Notes (Signed)
HPI:  Annette Tucker was previously seen in the [facility] due to a personal and family history of cancer and concerns regarding a hereditary predisposition to cancer. Please refer to our prior cancer genetics clinic note for more information regarding our discussion, assessment and recommendations, at the time. Annette Tucker recent genetic test results were disclosed to her, as were recommendations warranted by these results. These results and recommendations are discussed in more detail below.  CANCER HISTORY:  Oncology History  Endometrial ca [facility])  05/17/2014 Initial Diagnosis   Endometrial ca   06/05/2014 Surgery   TRH/BSO and staging. IAUPSC 0/12 nodes, no LVSI   07/12/2014 -  Chemotherapy   paclitaxel and carboplatin   08/21/2014 - 09/18/2014 Radiation Therapy     Uterine cancer ([place]) (Resolved)  05/17/2014 Initial Diagnosis   Uterine cancer    - 11/01/2014 Chemotherapy   Completed 6 cycles of paclitaxel and carboplatin based chemotherapy   06/05/2014 Surgery   IA UPSC    Radiation Therapy     Ductal carcinoma in situ (DCIS) of right breast  05/10/2019 Initial Diagnosis   Routine screening mammogram detected a 0.7cm group of calcifications in the upper outer right breast, a 0.5cm asymmetry in the outer right breast, and no axillary adenopathy. Biopsy DCIS with calcifications, intermediate to high grade, ER+ 90%, PR- 0%.    Genetic Testing   No pathogenic variants identified. VUS in MSH2 called c.5C>T identified on the [facility] STAT Breast Cancer + Common Hereditary Cancers Panel. The report date is 05/23/2019.  The STAT Breast cancer panel offered by [facility] includes sequencing and rearrangement analysis for the following 9 genes:  ATM, BRCA1, BRCA2, CDH1, CHEK2, PALB2, PTEN, STK11 and TP53.    The Common Hereditary Cancers Panel offered by [facility] includes sequencing and/or deletion duplication testing of the following 48 genes: APC, ATM, AXIN2, BARD1, BMPR1A,  BRCA1, BRCA2, BRIP1, CDH1, CDKN2A (p14ARF), CDKN2A (p16INK4a), CKD4, CHEK2, CTNNA1, DICER1, EPCAM (Deletion/duplication testing only), GREM1 (promoter region deletion/duplication testing only), KIT, MEN1, MLH1, MSH2, MSH3, MSH6, MUTYH, NBN, NF1, NHTL1, PALB2, PDGFRA, PMS2, POLD1, POLE, PTEN, RAD50, RAD51C, RAD51D, RNF43, SDHB, SDHC, SDHD, SMAD4, SMARCA4. STK11, TP53, TSC1, TSC2, and VHL.  The following genes were evaluated for sequence changes only: SDHA and HOXB13 c.251G>A variant only.      FAMILY HISTORY:  We obtained a detailed, 4-generation family history.  Significant diagnoses are listed below: Family History  Problem Relation Age of Onset   Mental retardation Mother    Kidney disease Father    Stroke Father    Cancer Maternal Uncle        unknown   Breast cancer Maternal Aunt        dx 42s   Bone cancer Paternal Uncle    Breast cancer Cousin        pat cousins x2, dx in their 75s   Bone cancer Maternal Uncle     Annette Tucker does not have children. She has one brother, 74, with no history of cancer. She has 2 nieces.  Annette Tucker mother passed at 66, no history of cancer. Patient had 5 maternal uncles, 3 maternal aunts. One of her aunts had breast cancer diagnosed at an age younger than 16. An uncle had bone cancer. Another uncle had cancer, unsure the type. No known cancers in maternal cousins. Maternal grandmother passed in her 26s, no cancer diagnoses. Maternal grandfather did not have cancer that the patient is aware of.  Annette Tucker father passed at  83, no history of cancer. Patient had 2 paternal aunts, 1 paternal uncle. Her uncle had bone cancer. He also had 2 daughters, both had breast cancer in their 85s. No known cancers in paternal grandparents, patient has limited info about them since they were deceased before she was born.  Annette Tucker is unaware of previous family history of genetic testing for hereditary cancer risks. Patient's maternal ancestors are of [place] descent, and paternal ancestors are of [place] descent. There is no reported Ashkenazi Jewish ancestry. There is no known consanguinity.  GENETIC TEST RESULTS: Genetic testing reported out on 05/23/2019 through the [facility] STAT Breast Cancer + Common Hereditary cancer panel found no pathogenic mutations.   The STAT Breast cancer panel offered by [facility] includes sequencing and rearrangement analysis for the following 9 genes:  ATM, BRCA1, BRCA2, CDH1, CHEK2, PALB2, PTEN, STK11 and TP53.    The Common Hereditary Cancers Panel offered by [facility] includes sequencing and/or deletion duplication testing of the following 48 genes: APC, ATM, AXIN2, BARD1, BMPR1A, BRCA1, BRCA2, BRIP1, CDH1, CDKN2A (p14ARF), CDKN2A (p16INK4a), CKD4, CHEK2, CTNNA1, DICER1, EPCAM (Deletion/duplication testing only), GREM1 (promoter region deletion/duplication testing only), KIT, MEN1, MLH1, MSH2, MSH3, MSH6, MUTYH, NBN, NF1, NHTL1, PALB2, PDGFRA, PMS2, POLD1, POLE, PTEN, RAD50, RAD51C, RAD51D, RNF43, SDHB, SDHC, SDHD, SMAD4, SMARCA4. STK11, TP53, TSC1, TSC2, and VHL.  The following genes were evaluated for sequence changes only: SDHA and HOXB13 c.251G>A variant only.  The test report has been scanned into EPIC and is located under the Molecular Pathology section of the Results Review tab.  A portion of the result report is included below for reference.      We discussed with Annette Tucker that because current genetic testing is not perfect, it is possible there may be a gene mutation in one of these genes that current testing cannot detect, but that chance is small.  We also discussed, that there could be another gene that has not yet been discovered, or that we have not yet tested, that is responsible for the cancer diagnoses in the family. It is also possible there is a hereditary cause for the cancer in the family that Annette Tucker did not inherit and therefore was not identified in her testing.  Therefore, it is important to  remain in touch with cancer genetics in the future so that we can continue to offer Annette Tucker the most up to date genetic testing.   Genetic testing did identify a variant of uncertain significance (VUS) was identified in the MSH2 gene.  At this time, it is unknown if this variant is associated with increased cancer risk or if this is a normal finding, but most variants such as this get reclassified to being inconsequential. It should not be used to make medical management decisions. With time, we suspect the lab will determine the significance of this variant, if any. If we do learn more about it, we will try to contact Annette Tucker to discuss it further. However, it is important to stay in touch with [place] periodically and keep the address and phone number up to date.  ADDITIONAL GENETIC TESTING: We discussed with Annette Tucker that her genetic testing was fairly extensive.  If there are genes identified to increase cancer risk that can be analyzed in the future, we would be happy to discuss and coordinate this testing at that time.    CANCER SCREENING RECOMMENDATIONS: Annette Tucker test result is considered negative (normal).  This means that we have not identified  a hereditary cause for her  personal and family history of cancer at this time. Most cancers happen by chance and this negative test suggests that her cancer may fall into this category.    While reassuring, this does not definitively rule out a hereditary predisposition to cancer. It is still possible that there could be genetic mutations that are undetectable by current technology. There could be genetic mutations in genes that have not been tested or identified to increase cancer risk.  Therefore, it is recommended she continue to follow the cancer management and screening guidelines provided by her oncology and primary healthcare provider.   An individual's cancer risk and medical management are not determined by genetic test results alone. Overall  cancer risk assessment incorporates additional factors, including personal medical history, family history, and any available genetic information that may result in a personalized plan for cancer prevention and surveillance.  RECOMMENDATIONS FOR FAMILY MEMBERS:  Relatives in this family might be at some increased risk of developing cancer, over the general population risk, simply due to the family history of cancer.  We recommended female relatives in this family have a yearly mammogram beginning at age 12, or 31 years younger than the earliest onset of cancer, an annual clinical breast exam, and perform monthly breast self-exams. Female relatives in this family should also have a gynecological exam as recommended by their primary provider. All family members should have a colonoscopy by age 20, or as directed by their physicians.  FOLLOW-UP: Lastly, we discussed with Annette Tucker that cancer genetics is a rapidly advancing field and it is possible that new genetic tests will be appropriate for her and/or her family members in the future. We encouraged her to remain in contact with cancer genetics on an annual basis so we can update her personal and family histories and let her know of advances in cancer genetics that may benefit this family.   Our contact number was provided. Annette Tucker questions were answered to her satisfaction, and she knows she is welcome to call us at anytime with additional questions or concerns.   Faith Rogue, MS, [facility] Genetic Counselor [email or web address] Phone: [phone]

## 2019-05-26 ENCOUNTER — Other Ambulatory Visit (HOSPITAL_COMMUNITY)
Admission: RE | Admit: 2019-05-26 | Discharge: 2019-05-26 | Disposition: A | Discharge status: Home / Self Care | Payer: Medicare HMO | Source: Ambulatory Visit | Attending: General Surgery | Admitting: General Surgery

## 2019-05-26 DIAGNOSIS — Z01812 Encounter for preprocedural laboratory examination: Secondary | ICD-10-CM | POA: Diagnosis not present

## 2019-05-26 DIAGNOSIS — Z20828 Contact with and (suspected) exposure to other viral communicable diseases: Secondary | ICD-10-CM | POA: Diagnosis not present

## 2019-05-26 LAB — SARS CORONAVIRUS 2 (TAT 6-24 HRS): SARS Coronavirus 2: NEGATIVE

## 2019-05-29 ENCOUNTER — Ambulatory Visit
Admission: RE | Admit: 2019-05-29 | Discharge: 2019-05-29 | Disposition: A | Discharge status: Home / Self Care | Payer: Medicare HMO | Source: Ambulatory Visit | Attending: General Surgery | Admitting: General Surgery

## 2019-05-29 ENCOUNTER — Other Ambulatory Visit: Payer: Self-pay

## 2019-05-29 DIAGNOSIS — D0511 Intraductal carcinoma in situ of right breast: Secondary | ICD-10-CM | POA: Diagnosis not present

## 2019-05-30 ENCOUNTER — Ambulatory Visit (HOSPITAL_BASED_OUTPATIENT_CLINIC_OR_DEPARTMENT_OTHER): Payer: Medicare HMO | Admitting: Certified Registered"

## 2019-05-30 ENCOUNTER — Ambulatory Visit
Admission: RE | Admit: 2019-05-30 | Discharge: 2019-05-30 | Disposition: A | Discharge status: Home / Self Care | Payer: Medicare HMO | Source: Ambulatory Visit | Attending: General Surgery | Admitting: General Surgery

## 2019-05-30 ENCOUNTER — Other Ambulatory Visit: Payer: Self-pay

## 2019-05-30 ENCOUNTER — Ambulatory Visit (HOSPITAL_BASED_OUTPATIENT_CLINIC_OR_DEPARTMENT_OTHER)
Admission: RE | Admit: 2019-05-30 | Discharge: 2019-05-30 | Disposition: A | Discharge status: Home / Self Care | Payer: Medicare HMO | Attending: General Surgery | Admitting: General Surgery

## 2019-05-30 ENCOUNTER — Encounter (HOSPITAL_BASED_OUTPATIENT_CLINIC_OR_DEPARTMENT_OTHER)
Admission: RE | Disposition: A | Discharge status: Home / Self Care | Payer: Self-pay | Source: Home / Self Care | Attending: General Surgery

## 2019-05-30 ENCOUNTER — Encounter (HOSPITAL_BASED_OUTPATIENT_CLINIC_OR_DEPARTMENT_OTHER): Payer: Self-pay | Admitting: Certified Registered"

## 2019-05-30 DIAGNOSIS — M1990 Unspecified osteoarthritis, unspecified site: Secondary | ICD-10-CM | POA: Diagnosis not present

## 2019-05-30 DIAGNOSIS — Z923 Personal history of irradiation: Secondary | ICD-10-CM | POA: Diagnosis not present

## 2019-05-30 DIAGNOSIS — I10 Essential (primary) hypertension: Secondary | ICD-10-CM | POA: Diagnosis not present

## 2019-05-30 DIAGNOSIS — D0511 Intraductal carcinoma in situ of right breast: Secondary | ICD-10-CM

## 2019-05-30 DIAGNOSIS — Z8542 Personal history of malignant neoplasm of other parts of uterus: Secondary | ICD-10-CM | POA: Diagnosis not present

## 2019-05-30 DIAGNOSIS — Z79899 Other long term (current) drug therapy: Secondary | ICD-10-CM | POA: Diagnosis not present

## 2019-05-30 DIAGNOSIS — Z9221 Personal history of antineoplastic chemotherapy: Secondary | ICD-10-CM | POA: Diagnosis not present

## 2019-05-30 DIAGNOSIS — Z170 Estrogen receptor positive status [ER+]: Secondary | ICD-10-CM | POA: Diagnosis not present

## 2019-05-30 DIAGNOSIS — Z803 Family history of malignant neoplasm of breast: Secondary | ICD-10-CM | POA: Diagnosis not present

## 2019-05-30 DIAGNOSIS — C50911 Malignant neoplasm of unspecified site of right female breast: Secondary | ICD-10-CM | POA: Diagnosis not present

## 2019-05-30 DIAGNOSIS — E785 Hyperlipidemia, unspecified: Secondary | ICD-10-CM | POA: Diagnosis not present

## 2019-05-30 HISTORY — PX: BREAST LUMPECTOMY WITH RADIOACTIVE SEED LOCALIZATION: SHX6424

## 2019-05-30 SURGERY — BREAST LUMPECTOMY WITH RADIOACTIVE SEED LOCALIZATION
Anesthesia: General | Site: Breast | Laterality: Right

## 2019-05-30 MED ORDER — GABAPENTIN 100 MG PO CAPS
ORAL_CAPSULE | ORAL | Status: AC
  Filled 2019-05-30: qty 1

## 2019-05-30 MED ORDER — ACETAMINOPHEN 500 MG PO TABS
1000.0000 mg | ORAL_TABLET | ORAL | Status: AC
  Administered 2019-05-30: 1000 mg via ORAL

## 2019-05-30 MED ORDER — MIDAZOLAM HCL 2 MG/2ML IJ SOLN: 1.0000 mg | INTRAMUSCULAR | Status: DC | PRN

## 2019-05-30 MED ORDER — CEFAZOLIN SODIUM-DEXTROSE 2-4 GM/100ML-% IV SOLN
INTRAVENOUS | Status: AC
  Filled 2019-05-30: qty 100

## 2019-05-30 MED ORDER — TRAMADOL HCL 50 MG PO TABS: 50.0000 mg | ORAL_TABLET | Freq: Four times a day (QID) | ORAL | 0 refills | Status: DC | PRN

## 2019-05-30 MED ORDER — BUPIVACAINE HCL (PF) 0.25 % IJ SOLN
INTRAMUSCULAR | Status: DC | PRN
  Administered 2019-05-30: 10 mL

## 2019-05-30 MED ORDER — ONDANSETRON HCL 4 MG/2ML IJ SOLN: 4.0000 mg | Freq: Once | INTRAMUSCULAR | Status: DC | PRN

## 2019-05-30 MED ORDER — EPHEDRINE SULFATE 50 MG/ML IJ SOLN
INTRAMUSCULAR | Status: DC | PRN
  Administered 2019-05-30 (×2): 10 mg via INTRAVENOUS

## 2019-05-30 MED ORDER — FENTANYL CITRATE (PF) 100 MCG/2ML IJ SOLN: 25.0000 ug | INTRAMUSCULAR | Status: DC | PRN

## 2019-05-30 MED ORDER — PROPOFOL 10 MG/ML IV BOLUS
INTRAVENOUS | Status: DC | PRN
  Administered 2019-05-30: 100 mg via INTRAVENOUS

## 2019-05-30 MED ORDER — LIDOCAINE HCL (CARDIAC) PF 100 MG/5ML IV SOSY
PREFILLED_SYRINGE | INTRAVENOUS | Status: DC | PRN
  Administered 2019-05-30: 60 mg via INTRAVENOUS

## 2019-05-30 MED ORDER — ACETAMINOPHEN 500 MG PO TABS
ORAL_TABLET | ORAL | Status: AC
  Filled 2019-05-30: qty 1

## 2019-05-30 MED ORDER — LACTATED RINGERS IV SOLN
INTRAVENOUS | Status: DC
  Administered 2019-05-30: 11:00:00 via INTRAVENOUS

## 2019-05-30 MED ORDER — GABAPENTIN 100 MG PO CAPS
100.0000 mg | ORAL_CAPSULE | ORAL | Status: AC
  Administered 2019-05-30: 100 mg via ORAL

## 2019-05-30 MED ORDER — FENTANYL CITRATE (PF) 100 MCG/2ML IJ SOLN
INTRAMUSCULAR | Status: AC
  Filled 2019-05-30: qty 2

## 2019-05-30 MED ORDER — FENTANYL CITRATE (PF) 100 MCG/2ML IJ SOLN
50.0000 ug | INTRAMUSCULAR | Status: DC | PRN
  Administered 2019-05-30 (×2): 50 ug via INTRAVENOUS

## 2019-05-30 MED ORDER — DEXAMETHASONE SODIUM PHOSPHATE 4 MG/ML IJ SOLN
INTRAMUSCULAR | Status: DC | PRN
  Administered 2019-05-30: 5 mg via INTRAVENOUS
  Administered 2019-05-30: 4 mg via INTRAVENOUS

## 2019-05-30 MED ORDER — PROPOFOL 500 MG/50ML IV EMUL
INTRAVENOUS | Status: DC | PRN
  Administered 2019-05-30: 25 ug/kg/min via INTRAVENOUS

## 2019-05-30 MED ORDER — CEFAZOLIN SODIUM-DEXTROSE 2-4 GM/100ML-% IV SOLN
2.0000 g | INTRAVENOUS | Status: AC
  Administered 2019-05-30: 2 g via INTRAVENOUS

## 2019-05-30 SURGICAL SUPPLY — 47 items
ADH SKN CLS APL DERMABOND .7 (GAUZE/BANDAGES/DRESSINGS) ×1
APL PRP STRL LF DISP 70% ISPRP (MISCELLANEOUS) ×1
BINDER BREAST LRG (GAUZE/BANDAGES/DRESSINGS) ×2 IMPLANT
BINDER BREAST XLRG (GAUZE/BANDAGES/DRESSINGS) IMPLANT
BLADE SURG 15 STRL LF DISP TIS (BLADE) ×1 IMPLANT
BLADE SURG 15 STRL SS (BLADE) ×3
CHLORAPREP W/TINT 26 (MISCELLANEOUS) ×3 IMPLANT
CLIP VESOCCLUDE SM WIDE 6/CT (CLIP) ×2 IMPLANT
CLOSURE WOUND 1/2 X4 (GAUZE/BANDAGES/DRESSINGS) ×1
COVER BACK TABLE REUSABLE LG (DRAPES) ×3 IMPLANT
COVER MAYO STAND REUSABLE (DRAPES) ×3 IMPLANT
COVER PROBE W GEL 5X96 (DRAPES) ×3 IMPLANT
DERMABOND ADVANCED (GAUZE/BANDAGES/DRESSINGS) ×2
DERMABOND ADVANCED .7 DNX12 (GAUZE/BANDAGES/DRESSINGS) ×1 IMPLANT
DRAPE LAPAROSCOPIC ABDOMINAL (DRAPES) ×3 IMPLANT
DRAPE UTILITY XL STRL (DRAPES) ×3 IMPLANT
ELECT COATED BLADE 2.86 ST (ELECTRODE) ×3 IMPLANT
ELECT REM PT RETURN 9FT ADLT (ELECTROSURGICAL) ×3
ELECTRODE REM PT RTRN 9FT ADLT (ELECTROSURGICAL) ×1 IMPLANT
GLOVE BIOGEL PI IND STRL 6.5 (GLOVE) IMPLANT
GLOVE BIOGEL PI IND STRL 7.5 (GLOVE) ×1 IMPLANT
GLOVE BIOGEL PI INDICATOR 6.5 (GLOVE) ×2
GLOVE BIOGEL PI INDICATOR 7.5 (GLOVE) ×2
GLOVE SURG SS PI 6.5 STRL IVOR (GLOVE) ×2 IMPLANT
GLOVE SURG SS PI 7.0 STRL IVOR (GLOVE) ×2 IMPLANT
GOWN STRL REUS W/ TWL LRG LVL3 (GOWN DISPOSABLE) ×2 IMPLANT
GOWN STRL REUS W/TWL LRG LVL3 (GOWN DISPOSABLE) ×6
HEMOSTAT ARISTA ABSORB 3G PWDR (HEMOSTASIS) IMPLANT
KIT MARKER MARGIN INK (KITS) ×3 IMPLANT
NDL HYPO 25X1 1.5 SAFETY (NEEDLE) ×1 IMPLANT
NEEDLE HYPO 25X1 1.5 SAFETY (NEEDLE) ×3 IMPLANT
NS IRRIG 1000ML POUR BTL (IV SOLUTION) ×2 IMPLANT
PACK BASIN DAY SURGERY FS (CUSTOM PROCEDURE TRAY) ×3 IMPLANT
PENCIL SMOKE EVACUATOR (MISCELLANEOUS) ×3 IMPLANT
SLEEVE SCD COMPRESS KNEE MED (MISCELLANEOUS) ×3 IMPLANT
SPONGE LAP 4X18 RFD (DISPOSABLE) ×3 IMPLANT
STRIP CLOSURE SKIN 1/2X4 (GAUZE/BANDAGES/DRESSINGS) ×2 IMPLANT
SUT MNCRL AB 4-0 PS2 18 (SUTURE) ×3 IMPLANT
SUT MON AB 5-0 PS2 18 (SUTURE) IMPLANT
SUT SILK 2 0 SH (SUTURE) ×2 IMPLANT
SUT VIC AB 2-0 SH 27 (SUTURE) ×6
SUT VIC AB 2-0 SH 27XBRD (SUTURE) ×1 IMPLANT
SUT VIC AB 3-0 SH 27 (SUTURE) ×3
SUT VIC AB 3-0 SH 27X BRD (SUTURE) ×1 IMPLANT
SYR CONTROL 10ML LL (SYRINGE) ×3 IMPLANT
TOWEL GREEN STERILE FF (TOWEL DISPOSABLE) ×3 IMPLANT
TRAY FAXITRON CT DISP (TRAY / TRAY PROCEDURE) ×3 IMPLANT

## 2019-05-30 NOTE — Interval H&P Note (Signed)
History and Physical Interval Note:  05/30/2019 12:21 PM  Annette Tucker  has presented today for surgery, with the diagnosis of RIGHT BREAST CANCER.  The various methods of treatment have been discussed with the patient and family. After consideration of risks, benefits and other options for treatment, the patient has consented to  Procedure(s): RIGHT BREAST LUMPECTOMY WITH RADIOACTIVE SEED LOCALIZATION (Right) as a surgical intervention.  The patient's history has been reviewed, patient examined, no change in status, stable for surgery.  I have reviewed the patient's chart and labs.  Questions were answered to the patient's satisfaction.     Rolm Bookbinder

## 2019-05-30 NOTE — Anesthesia Preprocedure Evaluation (Addendum)
Anesthesia Evaluation  Patient identified by MRN, date of birth, ID band Patient awake    Reviewed: Allergy & Precautions, NPO status , Patient's Chart, lab work & pertinent test results  Airway Mallampati: II  TM Distance: >3 FB Neck ROM: Full    Dental  (+) Teeth Intact, Dental Advisory Given   Pulmonary neg pulmonary ROS,    Pulmonary exam normal breath sounds clear to auscultation       Cardiovascular hypertension, Pt. on medications (-) angina(-) CAD and (-) Past MI Normal cardiovascular exam Rhythm:Regular Rate:Normal     Neuro/Psych  Neuromuscular disease    GI/Hepatic negative GI ROS, Neg liver ROS,   Endo/Other  negative endocrine ROS  Renal/GU negative Renal ROS     Musculoskeletal  (+) Arthritis ,   Abdominal   Peds  Hematology negative hematology ROS (+)   Anesthesia Other Findings Day of surgery medications reviewed with the patient.  Right breast cancer   Reproductive/Obstetrics Endometrial cancer                            Anesthesia Physical Anesthesia Plan  ASA: II  Anesthesia Plan: General   Post-op Pain Management:    Induction: Intravenous  PONV Risk Score and Plan: 3 and Dexamethasone, Ondansetron and Treatment may vary due to age or medical condition  Airway Management Planned: LMA  Additional Equipment:   Intra-op Plan:   Post-operative Plan: Extubation in OR  Informed Consent: I have reviewed the patients History and Physical, chart, labs and discussed the procedure including the risks, benefits and alternatives for the proposed anesthesia with the patient or authorized representative who has indicated his/her understanding and acceptance.     Dental advisory given  Plan Discussed with: CRNA  Anesthesia Plan Comments:        Anesthesia Quick Evaluation

## 2019-05-30 NOTE — H&P (Signed)
89 yof referred by Dr Lindi Adie for new right breast dcis. she has prior history in 2015 of endometrial cancer treated with surgery/chemo/rads and is doing well with this now. she has no mass or dc. she has prior benign surgical biopsy on left breast 30 years ago and history cyst aspirations. she underwent screening mm that shows c density breasts. she has 7 mm grouped calcifications in the right upper outer quadrant. her ax [place] is negative. biopsy shows a high grade DCIS that is er pos, pr negative. she is here to discuss options today   Past Surgical History Tawni Pummel, RN; 05/17/2019 7:28 AM) Hysterectomy (due to cancer) - Complete  Tonsillectomy   Diagnostic Studies History Tawni Pummel, RN; 05/17/2019 7:28 AM) Colonoscopy  1-5 years ago Mammogram  within last year Pap Smear  1-5 years ago  Medication History Tawni Pummel, RN; 05/17/2019 7:28 AM) Medications Reconciled  Social History Tawni Pummel, RN; 05/17/2019 7:28 AM) Alcohol use  Occasional alcohol use. No caffeine use  No drug use  Tobacco use  Never smoker.  Family History Tawni Pummel, RN; 05/17/2019 7:28 AM) Heart Disease  Brother. Hypertension  Father. Kidney Disease  Father.  Pregnancy / Birth History Tawni Pummel, RN; 05/17/2019 7:28 AM) Age at menarche  5 years. Age of menopause  46-50 Gravida  0 Irregular periods  Para  0  Other Problems Tawni Pummel, RN; 05/17/2019 7:28 AM) Cancer  High blood pressure     Review of Systems Sunday Spillers Led[place] RN; 05/17/2019 7:28 AM) General Not Present- Appetite Loss, Chills, Fatigue, Fever, Night Sweats, Weight Gain and Weight Loss. Skin Not Present- Change in Wart/Mole, Dryness, Hives, Jaundice, New Lesions, Non-Healing Wounds, Rash and Ulcer. HEENT Present- Wears glasses/contact lenses. Not Present- Earache, Hearing Loss, Hoarseness, Nose Bleed, Oral Ulcers, Ringing in the Ears, Seasonal Allergies, Sinus Pain, Sore Throat, Visual  Disturbances and Yellow Eyes. Respiratory Not Present- Bloody sputum, Chronic Cough, Difficulty Breathing, Snoring and Wheezing. Breast Not Present- Breast Mass, Breast Pain, Nipple Discharge and Skin Changes. Cardiovascular Not Present- Chest Pain, Difficulty Breathing Lying Down, Leg Cramps, Palpitations, Rapid Heart Rate, Shortness of Breath and Swelling of Extremities. Gastrointestinal Present- Hemorrhoids. Not Present- Abdominal Pain, Bloating, Bloody Stool, Change in Bowel Habits, Chronic diarrhea, Constipation, Difficulty Swallowing, Excessive gas, Gets full quickly at meals, Indigestion, Nausea, Rectal Pain and Vomiting. Female Genitourinary Not Present- Frequency, Nocturia, Painful Urination, Pelvic Pain and Urgency. Musculoskeletal Not Present- Back Pain, Joint Pain, Joint Stiffness, Muscle Pain, Muscle Weakness and Swelling of Extremities. Neurological Not Present- Decreased Memory, Fainting, Headaches, Numbness, Seizures, Tingling, Tremor, Trouble walking and Weakness. Psychiatric Not Present- Anxiety, Bipolar, Change in Sleep Pattern, Depression, Fearful and Frequent crying. Endocrine Not Present- Cold Intolerance, Excessive Hunger, Hair Changes, Heat Intolerance, Hot flashes and New Diabetes. Hematology Not Present- Blood Thinners, Easy Bruising, Excessive bleeding, Gland problems, HIV and Persistent Infections.   Assessment & Plan Rolm Bookbinder MD; 05/17/2019 11:10 AM) BREAST NEOPLASM, TIS (DCIS), RIGHT (D05.11) Story: Right breast seed guided lumpectomy We discussed the staging and pathophysiology of breast cancer. We discussed all of the different options for treatment for breast cancer including surgery, chemotherapy, radiation therapy, Herceptin, and antiestrogen therapy. does not need sn biopsy obviously We discussed the options for treatment of the breast cancer which included lumpectomy versus a mastectomy. We discussed the performance of the lumpectomy with radioactive  seed placement. We discussed a 5-10% chance of a positive margin requiring reexcision in the operating room. We also discussed that she will likely need  radiation therapy if she undergoes lumpectomy. The breast cannot undergo more radiation therapy in the same breast after lumpectomy in the future. We discussed mastectomy and the postoperative care for that as well. Mastectomy can be followed by reconstruction. The decision for lumpectomy vs mastectomy has no impact on decision for chemotherapy. Most mastectomy patients will not need radiation therapy. We discussed that there is no difference in her survival whether she undergoes lumpectomy with radiation therapy or antiestrogen therapy versus a mastectomy. There is also no real difference between her recurrence in the breast. We discussed the risks of operation including bleeding, infection, possible reoperation. She understands her further therapy will be based on what her stages at the time of her operation.

## 2019-05-30 NOTE — Anesthesia Procedure Notes (Signed)
Procedure Name: LMA Insertion Date/Time: 05/30/2019 12:43 PM Performed by: Signe Colt, CRNA Pre-anesthesia Checklist: Patient identified, Emergency Drugs available, Suction available and Patient being monitored Patient Re-evaluated:Patient Re-evaluated prior to induction Oxygen Delivery Method: Circle system utilized Preoxygenation: Pre-oxygenation with 100% oxygen Induction Type: IV induction Ventilation: Mask ventilation without difficulty LMA: LMA inserted LMA Size: 4.0 Number of attempts: 1 Airway Equipment and Method: Bite block Placement Confirmation: positive ETCO2 Tube secured with: Tape Dental Injury: Teeth and Oropharynx as per pre-operative assessment

## 2019-05-30 NOTE — Transfer of Care (Signed)
Immediate Anesthesia Transfer of Care Note  Patient: Annette Tucker  Procedure(s) Performed: RIGHT BREAST LUMPECTOMY WITH RADIOACTIVE SEED LOCALIZATION (Right Breast)  Patient Location: PACU  Anesthesia Type:General  Level of Consciousness: drowsy and patient cooperative  Airway & Oxygen Therapy: Patient Spontanous Breathing and Patient connected to face mask oxygen  Post-op Assessment: Report given to RN and Post -op Vital signs reviewed and stable  Post vital signs: Reviewed and stable  Last Vitals:  Vitals Value Taken Time  BP 96/56 05/30/19 1335  Temp    Pulse 84 05/30/19 1336  Resp 14 05/30/19 1336  SpO2 100 % 05/30/19 1336  Vitals shown include unvalidated device data.  Last Pain:  Vitals:   05/30/19 1113  TempSrc: Temporal  PainSc: 0-No pain         Complications: No apparent anesthesia complications

## 2019-05-30 NOTE — Discharge Instructions (Signed)
[place] Office Phone Number [phone]   POST OP INSTRUCTIONS Take 400 mg of ibuprofen every 8 hours or 650 mg tylenol every 6 hours for next 72 hours then as needed. Use ice several times daily also. Always review your discharge instruction sheet given to you by the facility where your surgery was performed.  IF YOU HAVE DISABILITY OR FAMILY LEAVE FORMS, YOU MUST BRING THEM TO THE OFFICE FOR PROCESSING.  DO NOT GIVE THEM TO YOUR DOCTOR.  1. A prescription for pain medication may be given to you upon discharge.  Take your pain medication as prescribed, if needed.  If narcotic pain medicine is not needed, then you may take acetaminophen (Tylenol), naprosyn (Alleve) or ibuprofen (Advil) as needed. No Tylenol until 5:30pm 2. Take your usually prescribed medications unless otherwise directed 3. If you need a refill on your pain medication, please contact your pharmacy.  They will contact our office to request authorization.  Prescriptions will not be filled after 5pm or on week-ends. 4. You should eat very light the first 24 hours after surgery, such as soup, crackers, pudding, etc.  Resume your normal diet the day after surgery. 5. Most patients will experience some swelling and bruising in the breast.  Ice packs and a good support bra will help.  Wear the breast binder provided or a sports bra for 72 hours day and night.  After that wear a sports bra during the day until you return to the office. Swelling and bruising can take several days to resolve.  6. It is common to experience some constipation if taking pain medication after surgery.  Increasing fluid intake and taking a stool softener will usually help or prevent this problem from occurring.  A mild laxative (Milk of Magnesia or Miralax) should be taken according to package directions if there are no bowel movements after 48 hours. 7. Unless discharge instructions indicate otherwise, you may remove your bandages 48 hours  after surgery and you may shower at that time.  You may have steri-strips (small skin tapes) in place directly over the incision.  These strips should be left on the skin for 7-10 days and will come off on their own.  If your surgeon used skin glue on the incision, you may shower in 24 hours.  The glue will flake off over the next 2-3 weeks.  Any sutures or staples will be removed at the office during your follow-up visit. 8. ACTIVITIES:  You may resume regular daily activities (gradually increasing) beginning the next day.  Wearing a good support bra or sports bra minimizes pain and swelling.  You may have sexual intercourse when it is comfortable. a. You may drive when you no longer are taking prescription pain medication, you can comfortably wear a seatbelt, and you can safely maneuver your car and apply brakes. b. RETURN TO WORK:  ______________________________________________________________________________________ 9. You should see your doctor in the office for a follow-up appointment approximately two weeks after your surgery.  Your doctors nurse will typically make your follow-up appointment when she calls you with your pathology report.  Expect your pathology report 3-4 business days after your surgery.  You may call to check if you do not hear from [place] after three days. 10. OTHER INSTRUCTIONS: _______________________________________________________________________________________________ _____________________________________________________________________________________________________________________________________ _____________________________________________________________________________________________________________________________________ _____________________________________________________________________________________________________________________________________  WHEN TO CALL DR WAKEFIELD: 1. Fever over 101.0 2. Nausea and/or vomiting. 3. Extreme swelling or  bruising. 4. Continued bleeding from incision. 5. Increased pain, redness, or drainage from the incision.  The  clinic staff is available to answer your questions during regular business hours.  Please dont hesitate to call and ask to speak to one of the nurses for clinical concerns.  If you have a medical emergency, go to the nearest emergency room or call 911.  A surgeon from [facility] is always on call at the hospital.  For further questions, please visit [email or web address]    Post Anesthesia Home Care Instructions  Activity: Get plenty of rest for the remainder of the day. A responsible individual must stay with you for 24 hours following the procedure.  For the next 24 hours, DO NOT: -Drive a car -Paediatric nurse -Drink alcoholic beverages -Take any medication unless instructed by your physician -Make any legal decisions or sign important papers.  Meals: Start with liquid foods such as gelatin or soup. Progress to regular foods as tolerated. Avoid greasy, spicy, heavy foods. If nausea and/or vomiting occur, drink only clear liquids until the nausea and/or vomiting subsides. Call your physician if vomiting continues.  Special Instructions/Symptoms: Your throat may feel dry or sore from the anesthesia or the breathing tube placed in your throat during surgery. If this causes discomfort, gargle with warm salt water. The discomfort should disappear within 24 hours.  If you had a scopolamine patch placed behind your ear for the management of post- operative nausea and/or vomiting:  1. The medication in the patch is effective for 72 hours, after which it should be removed.  Wrap patch in a tissue and discard in the trash. Wash hands thoroughly with soap and water. 2. You may remove the patch earlier than 72 hours if you experience unpleasant side effects which may include dry mouth, dizziness or visual disturbances. 3. Avoid touching the patch. Wash your  hands with soap and water after contact with the patch.

## 2019-05-30 NOTE — Op Note (Signed)
Preoperativediagnosis: clinical stage 0 right breast cancer Postoperative diagnosis: Same as above Procedure:Right breast seed guided lumpectomy Surgeon Dr. Serita Grammes Anes general Complications none Drains none Specimens 1. Right breast tissue marked with paint containing two seed and clip 2. Additional right breast superior, inferior and lateral margins marked short superior, long lateral double deep EBL minimal Sponge and needle count correct times two dispo to recovery stable   Indication:72 yof referred by Dr Lindi Adie for new right breast dcis. she has prior history in 2015 of endometrial cancer treated with surgery/chemo/rads and is doing well with this now. she has no mass or dc. she has prior benign surgical biopsy on left breast 30 years ago and history cyst aspirations. she underwent screening mm that shows c density breasts. she has 7 mm grouped calcifications in the right upper outer quadrant. her ax [place] is negative. biopsy shows a high grade DCIS that is er pos, pr negative.   Procedure: After informed consent was obtained the patient was taken to the operating room.She was given antibiotics. SCDs were in place. She was placed under general anesthesia without complication. She was prepped and draped in the standard sterile surgical fashion. Surgical timeout was then performed.  Iinfiltrated marcaine in the region of the seed. I then made curvilinear incision overlying the seed as I felt the anterior margin would be close. I then used the neoprobe to remove the seed and surrounding tissue to get a clear margin.  I confirmed removal of the seed and clip on mammography. The 3D pictures looked like calcifications and clip might be close in the superior, medial and inferior margins so I removed more of these and marked as above.I then obtained hemostasis. I placed clips in the cavity. I closed this with 2-0 Vicryl, 3-0 Vicryl, and4-0 Monocryl.I placed steristrips  and glue over this.She was extubated and transferred to the recovery room in stable condition.

## 2019-05-31 ENCOUNTER — Encounter (HOSPITAL_BASED_OUTPATIENT_CLINIC_OR_DEPARTMENT_OTHER): Payer: Self-pay | Admitting: General Surgery

## 2019-05-31 ENCOUNTER — Telehealth: Payer: Self-pay

## 2019-05-31 NOTE — Anesthesia Postprocedure Evaluation (Signed)
Anesthesia Post Note  Patient: Annette Tucker  Procedure(s) Performed: RIGHT BREAST LUMPECTOMY WITH RADIOACTIVE SEED LOCALIZATION (Right Breast)     Patient location during evaluation: PACU Anesthesia Type: General Level of consciousness: awake and alert Pain management: pain level controlled Vital Signs Assessment: post-procedure vital signs reviewed and stable Respiratory status: spontaneous breathing, nonlabored ventilation and respiratory function stable Cardiovascular status: blood pressure returned to baseline and stable Postop Assessment: no apparent nausea or vomiting Anesthetic complications: no    Last Vitals:  Vitals:   05/30/19 1400 05/30/19 1415  BP: (!) 143/75 134/68  Pulse: 90 89  Resp: 20 20  Temp:  36.7 C  SpO2: 99% 97%    Last Pain:  Vitals:   05/30/19 1415  TempSrc:   PainSc: 0-No pain                 Catalina Gravel

## 2019-05-31 NOTE — Telephone Encounter (Signed)
Nutrition Assessment  Reason for Assessment:  Pt attended Breast Clinic on 05/17/2019 and was given nutrition packet by nurse navigator  ASSESSMENT:  73 year old female with right breast cancer.  S/p lumpectomy yesterday.  Planning radiation and antiestrogens.  History of endometrial cancer in 2015 and HLD, HTN.  Spoke with patient via phone to introduce self and service at [facility].  Patient reports that she is doing well after surgery and has normal appetite.  Medications:  reviewed  Labs: reviewed  Anthropometrics:   Height: 62 inches Weight: 121 lb BMI: 22   NUTRITION DIAGNOSIS: Food and nutrition related knowledge deficit related to new diagnosis of breast cancer as evidenced by no prior need for nutrition related information.  INTERVENTION:   Discussed briefly packet of information regarding nutritional tips for breast cancer patients. Questions answered to patient's satisfaction. Contact information provided and patient knows to contact me with questions/concerns.    MONITORING, EVALUATION, and GOAL: Pt will consume a healthy plant based diet to maintain lean body mass throughout treatment.   Arther Heisler B. Zenia Resides, [place], [place] Registered Dietitian [phone] (pager)

## 2019-06-01 LAB — SURGICAL PATHOLOGY

## 2019-06-04 NOTE — Progress Notes (Signed)
Patient Care Team: [name], MD as PCP - General (Internal Medicine) Mauro Kaufmann, RN as Oncology Nurse Navigator Rockwell Germany, RN as Oncology Nurse Navigator Rolm Bookbinder, MD as Consulting Physician (General Surgery) Nicholas Lose, MD as Consulting Physician (Hematology and Oncology) Gery Pray, MD as Consulting Physician (Radiation Oncology)  DIAGNOSIS:    ICD-10-CM   1. Endometrial ca [facility])  C54.1     SUMMARY OF ONCOLOGIC HISTORY: Oncology History  Endometrial ca [facility])  05/17/2014 Initial Diagnosis   Endometrial ca   06/05/2014 Surgery   TRH/BSO and staging. IAUPSC 0/12 nodes, no LVSI   07/12/2014 -  Chemotherapy   paclitaxel and carboplatin   08/21/2014 - 09/18/2014 Radiation Therapy     Uterine cancer ([place]) (Resolved)  05/17/2014 Initial Diagnosis   Uterine cancer    - 11/01/2014 Chemotherapy   Completed 6 cycles of paclitaxel and carboplatin based chemotherapy   06/05/2014 Surgery   IA UPSC    Radiation Therapy     Ductal carcinoma in situ (DCIS) of right breast  05/10/2019 Initial Diagnosis   Routine screening mammogram detected a 0.7cm group of calcifications in the upper outer right breast, a 0.5cm asymmetry in the outer right breast, and no axillary adenopathy. Biopsy DCIS with calcifications, intermediate to high grade, ER+ 90%, PR- 0%.    Genetic Testing   No pathogenic variants identified. VUS in MSH2 called c.5C>T identified on the [facility] STAT Breast Cancer + Common Hereditary Cancers Panel. The report date is 05/23/2019.  The STAT Breast cancer panel offered by [facility] includes sequencing and rearrangement analysis for the following 9 genes:  ATM, BRCA1, BRCA2, CDH1, CHEK2, PALB2, PTEN, STK11 and TP53.    The Common Hereditary Cancers Panel offered by [facility] includes sequencing and/or deletion duplication testing of the following 48 genes: APC, ATM, AXIN2, BARD1, BMPR1A, BRCA1, BRCA2, BRIP1, CDH1, CDKN2A (p14ARF), CDKN2A (p16INK4a),  CKD4, CHEK2, CTNNA1, DICER1, EPCAM (Deletion/duplication testing only), GREM1 (promoter region deletion/duplication testing only), KIT, MEN1, MLH1, MSH2, MSH3, MSH6, MUTYH, NBN, NF1, NHTL1, PALB2, PDGFRA, PMS2, POLD1, POLE, PTEN, RAD50, RAD51C, RAD51D, RNF43, SDHB, SDHC, SDHD, SMAD4, SMARCA4. STK11, TP53, TSC1, TSC2, and VHL.  The following genes were evaluated for sequence changes only: SDHA and HOXB13 c.251G>A variant only.    05/30/2019 Surgery   Right lumpectomy Annette Tucker): intermediate grade DCIS, 0.9cm, clear margins.      CHIEF COMPLIANT: Follow-up s/p lumpectomy to review pathology   INTERVAL HISTORY: Annette Tucker is a 73 y.o. with above-mentioned history of right breast DCIS. Genetic testing was negative. She underwent a right lumpectomy on 05/30/19 with Dr. Donne Tucker for which pathology showed intermediate grade DCIS, 0.9cm, clear margins. She presents to the clinic today to review the pathology report and discuss further treatment.   REVIEW OF SYSTEMS:   Constitutional: Denies fevers, chills or abnormal weight loss Eyes: Denies blurriness of vision Ears, nose, mouth, throat, and face: Denies mucositis or sore throat Respiratory: Denies cough, dyspnea or wheezes Cardiovascular: Denies palpitation, chest discomfort Gastrointestinal: Denies nausea, heartburn or change in bowel habits Skin: Denies abnormal skin rashes Lymphatics: Denies new lymphadenopathy or easy bruising Neurological: Denies numbness, tingling or new weaknesses Behavioral/Psych: Mood is stable, no new changes  Extremities: No lower extremity edema Breast: denies any pain or lumps or nodules in either breasts All other systems were reviewed with the patient and are negative.  I have reviewed the past medical history, past surgical history, social history and family history with the patient and they are unchanged  from previous note.  ALLERGIES:  is allergic to latex and adhesive [tape].  MEDICATIONS:   Current Outpatient Medications  Medication Sig Dispense Refill  . amLODipine (NORVASC) 2.5 MG tablet TAKE 1 TABLET BY MOUTH DAILY 90 tablet 3  . Ascorbic Acid (VITAMIN C) 100 MG tablet Take 500 mg by mouth daily.    . Calcium Carbonate-Vitamin D (CALCIUM 600+D) 600-400 MG-UNIT per tablet Take 1 tablet by mouth 2 (two) times daily. '@lunch'  and in the evening    . cholecalciferol (VITAMIN D) 1000 UNITS tablet Take 1,000 Units by mouth daily.     . Coenzyme Q10 (CO Q-10) 100 MG CAPS Take 1 capsule by mouth every morning.     . fish oil-omega-3 fatty acids 1000 MG capsule Take 1 g by mouth every morning.     Marland Kitchen FLAXSEED, LINSEED, PO Take 1,000 mg by mouth daily.    . Magnesium 250 MG TABS Take 1 tablet by mouth every evening.     . Multiple Vitamins-Minerals (MULTIVITAMIN WITH MINERALS) tablet Take 1 tablet by mouth every morning.     . simvastatin (ZOCOR) 20 MG tablet TAKE ONE TABLET BY MOUTH  AT BEDTIME 90 tablet 3  . traMADol (ULTRAM) 50 MG tablet Take 1 tablet (50 mg total) by mouth every 6 (six) hours as needed. 10 tablet 0  . vitamin E 400 UNIT capsule Take 400 Units by mouth every morning.     . zinc gluconate 50 MG tablet Take 50 mg by mouth daily.     No current facility-administered medications for this visit.     PHYSICAL EXAMINATION: ECOG PERFORMANCE STATUS: 1 - Symptomatic but completely ambulatory  Vitals:   06/05/19 1420  BP: (!) 143/73  Pulse: 99  Resp: 18  Temp: 98.9 F (37.2 C)  SpO2: 100%   Filed Weights   06/05/19 1420  Weight: 121 lb 9.6 oz (55.2 kg)    GENERAL: alert, no distress and comfortable SKIN: skin color, texture, turgor are normal, no rashes or significant lesions EYES: normal, Conjunctiva are pink and non-injected, sclera clear OROPHARYNX: no exudate, no erythema and lips, buccal mucosa, and tongue normal  NECK: supple, thyroid normal size, non-tender, without nodularity LYMPH: no palpable lymphadenopathy in the cervical, axillary or inguinal  LUNGS: clear to auscultation and percussion with normal breathing effort HEART: regular rate & rhythm and no murmurs and no lower extremity edema ABDOMEN: abdomen soft, non-tender and normal bowel sounds MUSCULOSKELETAL: no cyanosis of digits and no clubbing  NEURO: alert & oriented x 3 with fluent speech, no focal motor/sensory deficits EXTREMITIES: No lower extremity edema  LABORATORY DATA:  I have reviewed the data as listed CMP Latest Ref Rng & Units 05/17/2019 04/04/2019 03/31/2018  Glucose 70 - 99 mg/dL 87 95 92  BUN 8 - 23 mg/dL '16 15 13  ' Creatinine 0.44 - 1.00 mg/dL 0.83 0.75 0.82  Sodium 135 - 145 mmol/L 141 140 140  Potassium 3.5 - 5.1 mmol/L 4.1 4.5 4.7  Chloride 98 - 111 mmol/L 103 102 102  CO2 22 - 32 mmol/L '26 28 27  ' Calcium 8.9 - 10.3 mg/dL 9.6 9.8 9.5  Total Protein 6.5 - 8.1 g/dL 7.7 7.3 7.0  Total Bilirubin 0.3 - 1.2 mg/dL 0.4 0.5 0.7  Alkaline Phos 38 - 126 U/L 76 - -  AST 15 - 41 U/L '18 20 21  ' ALT 0 - 44 U/L '17 15 18    ' Lab Results  Component Value Date   WBC 4.7 05/17/2019  HGB 14.1 05/17/2019   HCT 42.0 05/17/2019   MCV 90.7 05/17/2019   PLT 230 05/17/2019   NEUTROABS 3.1 05/17/2019    ASSESSMENT & PLAN:  Endometrial ca 05/10/2019:Routine screening mammogram detected a 0.7cm group of calcifications in the upper outer right breast, a 0.5cm asymmetry in the outer right breast, and no axillary adenopathy. Biopsy DCIS with calcifications, intermediate to high grade, ER+ 90%, PR- 0%.  Recommendation: 1. Breast conserving surgery 05/30/2019:  Rt Lumpectomy: DCIS IG 0.9 cm margins neg, ER 90%, PR 0% 2. Followed by adjuvant radiation therapy 3. Followed by antiestrogen therapy with anastrozole 5 years Genetic counseling and testing based on endometrial cancer history and aunt with breast cancer.  Pathology counseling: I discussed the final pathology report of the patient provided  a copy of this report. I discussed the margins as well as lymph node  surgeries. We also discussed the final staging along with previously performed ER/PR and HER-2/neu testing.  RTC at end of RT     No orders of the defined types were placed in this encounter.  The patient has a good understanding of the overall plan. she agrees with it. she will call with any problems that may develop before the next visit here.  Nicholas Lose, MD 06/05/2019  Julious Oka Dorshimer, am acting as scribe for Dr. Nicholas Lose.  I have reviewed the above documentation for accuracy and completeness, and I agree with the above.

## 2019-06-05 ENCOUNTER — Inpatient Hospital Stay: Payer: Medicare HMO | Attending: Gynecologic Oncology | Admitting: Hematology and Oncology

## 2019-06-05 ENCOUNTER — Encounter: Payer: Self-pay | Admitting: *Deleted

## 2019-06-05 ENCOUNTER — Other Ambulatory Visit: Payer: Self-pay

## 2019-06-05 DIAGNOSIS — C541 Malignant neoplasm of endometrium: Secondary | ICD-10-CM

## 2019-06-05 DIAGNOSIS — Z90710 Acquired absence of both cervix and uterus: Secondary | ICD-10-CM | POA: Diagnosis not present

## 2019-06-05 DIAGNOSIS — Z9221 Personal history of antineoplastic chemotherapy: Secondary | ICD-10-CM | POA: Insufficient documentation

## 2019-06-05 DIAGNOSIS — D0511 Intraductal carcinoma in situ of right breast: Secondary | ICD-10-CM | POA: Insufficient documentation

## 2019-06-05 DIAGNOSIS — Z8049 Family history of malignant neoplasm of other genital organs: Secondary | ICD-10-CM | POA: Diagnosis not present

## 2019-06-05 DIAGNOSIS — Z923 Personal history of irradiation: Secondary | ICD-10-CM | POA: Insufficient documentation

## 2019-06-05 NOTE — Assessment & Plan Note (Signed)
05/10/2019:Routine screening mammogram detected a 0.7cm group of calcifications in the upper outer right breast, a 0.5cm asymmetry in the outer right breast, and no axillary adenopathy. Biopsy DCIS with calcifications, intermediate to high grade, ER+ 90%, PR- 0%.  Recommendation: 1. Breast conserving surgery 05/30/2019:  Rt Lumpectomy: DCIS IG 0.9 cm margins neg, ER 90%, PR 0% 2. Followed by adjuvant radiation therapy 3. Followed by antiestrogen therapy with tamoxifen 5 years Genetic counseling and testing based on endometrial cancer history and aunt with breast cancer.  Pathology counseling: I discussed the final pathology report of the patient provided  a copy of this report. I discussed the margins as well as lymph node surgeries. We also discussed the final staging along with previously performed ER/PR and HER-2/neu testing.  RTC at end of RT

## 2019-06-12 ENCOUNTER — Ambulatory Visit
Admission: RE | Admit: 2019-06-12 | Discharge: 2019-06-12 | Disposition: A | Discharge status: Home / Self Care | Payer: Medicare HMO | Source: Ambulatory Visit | Attending: Radiation Oncology | Admitting: Radiation Oncology

## 2019-06-12 ENCOUNTER — Encounter: Payer: Self-pay | Admitting: Radiation Oncology

## 2019-06-12 ENCOUNTER — Other Ambulatory Visit: Payer: Self-pay

## 2019-06-12 VITALS — BP 136/93 | HR 97 | Temp 97.8°F | Resp 18 | Ht 62.0 in | Wt 122.5 lb

## 2019-06-12 DIAGNOSIS — Z79899 Other long term (current) drug therapy: Secondary | ICD-10-CM | POA: Insufficient documentation

## 2019-06-12 DIAGNOSIS — Z170 Estrogen receptor positive status [ER+]: Secondary | ICD-10-CM | POA: Insufficient documentation

## 2019-06-12 DIAGNOSIS — D0511 Intraductal carcinoma in situ of right breast: Secondary | ICD-10-CM

## 2019-06-12 DIAGNOSIS — Z9889 Other specified postprocedural states: Secondary | ICD-10-CM | POA: Diagnosis not present

## 2019-06-12 DIAGNOSIS — Z8542 Personal history of malignant neoplasm of other parts of uterus: Secondary | ICD-10-CM | POA: Diagnosis not present

## 2019-06-12 DIAGNOSIS — Z79810 Long term (current) use of selective estrogen receptor modulators (SERMs): Secondary | ICD-10-CM | POA: Diagnosis not present

## 2019-06-12 DIAGNOSIS — Z510 Encounter for antineoplastic radiation therapy: Secondary | ICD-10-CM | POA: Diagnosis present

## 2019-06-12 NOTE — Progress Notes (Signed)
  Radiation Oncology         [phone] ________________________________  Name: Annette Tucker MRN: [id number]  Date: 06/12/2019  DOB: Mar 17, 1946  SIMULATION AND TREATMENT PLANNING NOTE    ICD-10-CM   1. Ductal carcinoma in situ (DCIS) of right breast  D05.11     DIAGNOSIS: Stage0, RightBreast UOQ,DuctalCarcinomain Situ with Calicifications, ER+/ PR-,High grade DCIS  NARRATIVE:  The patient was brought to the [facility].  Identity was confirmed.  All relevant records and images related to the planned course of therapy were reviewed.  The patient freely provided informed written consent to proceed with treatment after reviewing the details related to the planned course of therapy. The consent form was witnessed and verified by the simulation staff.  Then, the patient was set-up in a stable reproducible  supine position for radiation therapy.  CT images were obtained.  Surface markings were placed.  The CT images were loaded into the planning software.  Then the target and avoidance structures were contoured.  Treatment planning then occurred.  The radiation prescription was entered and confirmed.  Then, I designed and supervised the construction of a total of 5 medically necessary complex treatment devices.  I have requested : 3D Simulation  I have requested a DVH of the following structures: Heart, lungs, lumpectomy cavity.  I have ordered:dose calc.  PLAN:  The patient will receive 40.05 Gy in 15 fractions directed to the right breast followed by a boost to the lumpectomy cavity of 12 Gy in 6 fractions.   Optical Surface Tracking Plan:  Since intensity modulated radiotherapy (IMRT) and 3D conformal radiation treatment methods are predicated on accurate and precise positioning for treatment, intrafraction motion monitoring is medically necessary to ensure accurate and safe treatment delivery.  The ability to quantify intrafraction motion without excessive ionizing  radiation dose can only be performed with optical surface tracking. Accordingly, surface imaging offers the opportunity to obtain 3D measurements of patient position throughout IMRT and 3D treatments without excessive radiation exposure.  I am ordering optical surface tracking for this patient's upcoming course of radiotherapy. ________________________________    Blair Promise, PhD, MD  This document serves as a record of services personally performed by Gery Pray, MD. It was created on his behalf by Clerance Lav, a trained medical scribe. The creation of this record is based on the scribe's personal observations and the provider's statements to them. This document has been checked and approved by the attending provider.

## 2019-06-12 NOTE — Progress Notes (Signed)
Radiation Oncology         [phone]) [phone] ________________________________  Name: Annette Tucker MRN: [id number]  Date: 06/12/2019  DOB: 06/11/1946  Re-Evaluation Note  CC: [name], MD  Nicholas Lose, MD    ICD-10-CM   1. Ductal carcinoma in situ (DCIS) of right breast  D05.11     Diagnosis: Stage 0, Right Breast UOQ, Ductal Carcinoma in Situ with Calicifications, ER+ / PR-, High grade DCIS  Narrative:  The patient returns today to discuss radiation treatment options. She was seen in the breast clinic on 05/17/2019.  Since consultation, she underwent genetic testing, which was negative.  She opted to proceed with right breast lumpectomy on 05/30/2019. Pathology from the procedure revealed 0.9 cm ductal carcinoma in situ, intermediate grade. Margins were uninvolved by carcinoma. Right inferior margin, right lateral margin, and right superior margin were also excised and biopsied, all of which were negative for residual carcinoma. Of note, there was some focal usual ductal hyperplasia identified at the right superior margin.  She has been following up with Dr. Lindi Adie, whom she last saw on 06/05/2019. At that time, they discussed antiestrogen therapy with Tamoxifen for five years following adjuvant radiation therapy.  On review of systems, the patient reports no significant pain within the breast.  She denies any nipple discharge or bleeding or swelling in her right arm or hand..   Allergies:  is allergic to latex and adhesive [tape].  Meds: Current Outpatient Medications  Medication Sig Dispense Refill  . amLODipine (NORVASC) 2.5 MG tablet TAKE 1 TABLET BY MOUTH DAILY 90 tablet 3  . Ascorbic Acid (VITAMIN C) 100 MG tablet Take 500 mg by mouth daily.    . Calcium Carbonate-Vitamin D (CALCIUM 600+D) 600-400 MG-UNIT per tablet Take 1 tablet by mouth 2 (two) times daily. _0  and in the evening    . cholecalciferol (VITAMIN D) 1000 UNITS tablet Take 1,000 Units by mouth 2  (two) times daily.     . Coenzyme Q10 (CO Q-10) 100 MG CAPS Take 1 capsule by mouth every morning.     . fish oil-omega-3 fatty acids 1000 MG capsule Take 1 g by mouth every morning.     Marland Kitchen FLAXSEED, LINSEED, PO Take 1,000 mg by mouth daily.    . Magnesium 250 MG TABS Take 1 tablet by mouth every evening.     . Multiple Vitamins-Minerals (MULTIVITAMIN WITH MINERALS) tablet Take 1 tablet by mouth every morning.     . simvastatin (ZOCOR) 20 MG tablet TAKE ONE TABLET BY MOUTH  AT BEDTIME 90 tablet 3  . vitamin E 400 UNIT capsule Take 400 Units by mouth every morning.     . zinc gluconate 50 MG tablet Take 50 mg by mouth daily.     No current facility-administered medications for this encounter.    Physical Findings: The patient is in no acute distress. Patient is alert and oriented.  height is _1  (1.575 m) and weight is 122 lb 8 oz (55.6 kg). Her temporal temperature is 97.8 F (36.6 C). Her blood pressure is 136/93 (abnormal) and her pulse is 97. Her respiration is 18 and oxygen saturation is 99%.  No significant changes. Lungs are clear to auscultation bilaterally. Heart has regular rate and rhythm. No palpable cervical, supraclavicular, or axillary adenopathy. Abdomen soft, non-tender, normal bowel sounds. Left breast: no palpable mass, nipple discharge or bleeding. Right breast: Well-healing scar in the lateral aspect of the breast.  No dominant mass appreciated in the  breast nipple discharge or bleeding  Lab Findings: Lab Results  Component Value Date   WBC 4.7 05/17/2019   HGB 14.1 05/17/2019   HCT 42.0 05/17/2019   MCV 90.7 05/17/2019   PLT 230 05/17/2019    Radiographic Findings: MM Breast Surgical Specimen  Result Date: 05/30/2019 CLINICAL DATA:  73 year old with biopsy-proven DCIS in the RIGHT breast. Radioactive seed localization was performed yesterday in anticipation of today's lumpectomy. EXAM: SPECIMEN RADIOGRAPH OF THE RIGHT BREAST COMPARISON:  Previous exam(s).  FINDINGS: Status post excision of the RIGHT breast. The radioactive seed and dumbbell-shaped tissue marker clip are present in the specimen and are completely intact. This was discussed with the operating room nurse at time of interpretation on 05/30/2019 at 1:16 p.m. IMPRESSION: Specimen radiograph of the RIGHT breast. Electronically Signed   By: Evangeline Dakin M.D.   On: 05/30/2019 13:20   MM RT RADIOACTIVE SEED LOC MAMMO GUIDE  Result Date: 05/29/2019 CLINICAL DATA:  The patient presents for seed localization of the RIGHT breast for recently diagnosed ductal carcinoma in situ. EXAM: MAMMOGRAPHIC GUIDED RADIOACTIVE SEED LOCALIZATION OF THE RIGHT BREAST COMPARISON:  05/08/2019 and earlier images from [place]: Patient presents for radioactive seed localization prior to lumpectomy. I met with the patient and we discussed the procedure of seed localization including benefits and alternatives. We discussed the high likelihood of a successful procedure. We discussed the risks of the procedure including infection, bleeding, tissue injury and further surgery. We discussed the low dose of radioactivity involved in the procedure. Informed, written consent was given. The usual time-out protocol was performed immediately prior to the procedure. Using mammographic guidance, sterile technique, 1% lidocaine and an I-125 radioactive seed, the dumbbell-shaped clip was localized using a LATERAL to MEDIAL approach. The follow-up mammogram images confirm the seed in the expected location and were marked for Dr. Donne Hazel. Follow-up survey of the patient confirms presence of the radioactive seed. Order number of I-125 seed:  [id number]. Total activity:  9.244 millicuries reference Date: 05/18/2019 The patient tolerated the procedure well and was released from the [facility]. She was given instructions regarding seed removal. IMPRESSION: Radioactive seed localization right breast. No apparent  complications. Electronically Signed   By: Nolon Nations M.D.   On: 05/29/2019 11:51    Impression:  Stage 0, Right Breast UOQ, Ductal Carcinoma in Situ with Calicifications, ER+ / PR-, High grade DCIS  With the findings of high-grade lesion and close surgical margin, I would recommend adjuvant radiation therapy as part of breast conservation.  I discussed the general course of treatment side effects and potential toxicities of radiation therapy in the situation with patient.  sHe appears to understand and wishes to proceed with planned course of treatment.  Plan:  Patient is scheduled for CT simulation later today.  Treatments to begin approximately 2 weeks.  Patient would appear to be a good candidate for hypofractionated accelerated radiation therapy over approximately 4 weeks.  -----------------------------------  Blair Promise, PhD, MD   This document serves as a record of services personally performed by Gery Pray, MD. It was created on his behalf by Clerance Lav, a trained medical scribe. The creation of this record is based on the scribe's personal observations and the provider's statements to them. This document has been checked and approved by the attending provider.

## 2019-06-12 NOTE — Progress Notes (Signed)
Location of Breast Cancer:   Ductal carcinoma in situ (DCIS) of right breast      Histology per Pathology Report: FINAL MICROSCOPIC DIAGNOSIS:   A. BREAST, RIGHT W/SEED, LUMPECTOMY:  - Ductal carcinoma in situ, intermediate grade, 0.9 cm  - Margins uninvolved by carcinoma (0.1; anterior margin)  - Previous biopsy site changes   Receptor Status: ER(90%), PR (0%)  Did patient present with symptoms (if so, please note symptoms) or was this found on screening mammography?:05/10/2019:Routine screening mammogram detected a 0.7cm group of calcifications in the upper outer right breast, a 0.5cm asymmetry in the outer right breast, and no axillary adenopathy. Biopsy DCIS with calcifications, intermediate to high grade, ER+ 90%, PR- 0%.   Past/Anticipated interventions by surgeon, if any: Procedure:Right breast seed guided lumpectomy Surgeon Dr. Serita Grammes  Past/Anticipated interventions by medical oncology, if any: Chemotherapy 06/05/19 Per Dr. Lindi Adie: Recommendation: 1. Breast conserving surgery 05/30/2019:  Rt Lumpectomy: DCIS IG 0.9 cm margins neg, ER 90%, PR 0% 2. Followed by adjuvant radiation therapy 3. Followed by antiestrogen therapy with anastrozole 5 years Genetic counseling and testing based on endometrial cancer history and aunt with breast cancer.  Pathology counseling: I discussed the final pathology report of the patient provided  a copy of this report. I discussed the margins as well as lymph node surgeries. We also discussed the final staging along with previously performed ER/PR and HER-2/neu testing.  RTC at end of RT  Lymphedema issues, if any:  No   Pain issues, if any:  Pt denies c/o pain.  SAFETY ISSUES: Prior radiation? Radiation treatment dates:   February 23, March 1, March 8, March 10, March 22   Site/dose:   Vaginal cuff 30 gray in 5 fractions  Pacemaker/ICD? No  Possible current pregnancy?No  Is the patient on methotrexate? No  Current  Complaints / other details:  Pt presents today for reconsult with Dr. Sondra Come. Pt has been treated prior by Dr. Sondra Come.   BP (!) 136/93 (BP Location: Left Arm, Patient Position: Sitting)   Pulse 97   Temp 97.8 F (36.6 C) (Temporal)   Resp 18   Ht '5\' 2"'  (1.575 m)   Wt 122 lb 8 oz (55.6 kg)   SpO2 99%   BMI 22.41 kg/m   Wt Readings from Last 3 Encounters:  06/12/19 122 lb 8 oz (55.6 kg)  06/05/19 121 lb 9.6 oz (55.2 kg)  05/30/19 121 lb 0.5 oz (54.9 kg)       Loma Sousa, RN 06/12/2019,8:24 AM

## 2019-06-12 NOTE — Patient Instructions (Signed)
Coronavirus (COVID-19) Are you at risk?  Are you at risk for the Coronavirus (COVID-19)?  To be considered HIGH RISK for Coronavirus (COVID-19), you have to meet the following criteria:  . Traveled to [place], [place], [place], [place] or [place]; or in the [place] to [place], [place], [place], or [place]; and have fever, cough, and shortness of breath within the last 2 weeks of travel OR . Been in close contact with a person diagnosed with COVID-19 within the last 2 weeks and have fever, cough, and shortness of breath . IF YOU DO NOT MEET THESE CRITERIA, YOU ARE CONSIDERED LOW RISK FOR COVID-19.  What to do if you are HIGH RISK for COVID-19?  . If you are having a medical emergency, call 911. . Seek medical care right away. Before you go to a doctor's office, urgent care or emergency department, call ahead and tell them about your recent travel, contact with someone diagnosed with COVID-19, and your symptoms. You should receive instructions from your physician's office regarding next steps of care.  . When you arrive at healthcare provider, tell the healthcare staff immediately you have returned from visiting [place], [place], [place], [place] or [place]; or traveled in the [place] to [place], [place], [place], or [place]; in the last two weeks or you have been in close contact with a person diagnosed with COVID-19 in the last 2 weeks.   . Tell the health care staff about your symptoms: fever, cough and shortness of breath. . After you have been seen by a medical provider, you will be either: o Tested for (COVID-19) and discharged home on quarantine except to seek medical care if symptoms worsen, and asked to  - Stay home and avoid contact with others until you get your results (4-5 days)  - Avoid travel on public transportation if possible (such as bus, train, or airplane) or o Sent to the Emergency Department by EMS for evaluation, COVID-19 testing, and possible  admission depending on your condition and test results.  What to do if you are LOW RISK for COVID-19?  Reduce your risk of any infection by using the same precautions used for avoiding the common cold or flu:  . Wash your hands often with soap and warm water for at least 20 seconds.  If soap and water are not readily available, use an alcohol-based hand sanitizer with at least 60% alcohol.  . If coughing or sneezing, cover your mouth and nose by coughing or sneezing into the elbow areas of your shirt or coat, into a tissue or into your sleeve (not your hands). . Avoid shaking hands with others and consider head nods or verbal greetings only. . Avoid touching your eyes, nose, or mouth with unwashed hands.  . Avoid close contact with people who are sick. . Avoid places or events with large numbers of people in one location, like concerts or sporting events. . Carefully consider travel plans you have or are making. . If you are planning any travel outside or inside the [place], visit the CDC's Travelers' Health webpage for the latest health notices. . If you have some symptoms but not all symptoms, continue to monitor at home and seek medical attention if your symptoms worsen. . If you are having a medical emergency, call 911.   ADDITIONAL HEALTHCARE OPTIONS FOR PATIENTS  [facility] / e-Visit: [email or web address]         [facility]: [phone]  [facility]: [phone]                   [facility]: [phone]   

## 2019-06-13 DIAGNOSIS — R69 Illness, unspecified: Secondary | ICD-10-CM | POA: Diagnosis not present

## 2019-06-14 DIAGNOSIS — D0511 Intraductal carcinoma in situ of right breast: Secondary | ICD-10-CM | POA: Diagnosis not present

## 2019-06-14 DIAGNOSIS — Z510 Encounter for antineoplastic radiation therapy: Secondary | ICD-10-CM | POA: Diagnosis not present

## 2019-06-15 ENCOUNTER — Telehealth: Payer: Self-pay | Admitting: Hematology and Oncology

## 2019-06-15 NOTE — Telephone Encounter (Signed)
Scheduled appt per 12/15 sch message - mailed reminder letter with appt date and time

## 2019-06-26 ENCOUNTER — Ambulatory Visit
Admission: RE | Admit: 2019-06-26 | Discharge: 2019-06-26 | Disposition: A | Discharge status: Home / Self Care | Payer: Medicare HMO | Source: Ambulatory Visit | Attending: Radiation Oncology | Admitting: Radiation Oncology

## 2019-06-26 ENCOUNTER — Other Ambulatory Visit: Payer: Self-pay

## 2019-06-26 DIAGNOSIS — D0511 Intraductal carcinoma in situ of right breast: Secondary | ICD-10-CM | POA: Diagnosis not present

## 2019-06-26 DIAGNOSIS — Z510 Encounter for antineoplastic radiation therapy: Secondary | ICD-10-CM | POA: Diagnosis not present

## 2019-06-26 NOTE — Progress Notes (Signed)
  Radiation Oncology         [phone] ________________________________  Name: Annette Tucker MRN: [id number]  Date: 06/26/2019  DOB: 25-Apr-1946  Simulation Verification Note    ICD-10-CM   1. Ductal carcinoma in situ (DCIS) of right breast  D05.11     Status: outpatient  NARRATIVE: The patient was brought to the treatment unit and placed in the planned treatment position. The clinical setup was verified. Then port films were obtained and uploaded to the radiation oncology medical record software.  The treatment beams were carefully compared against the planned radiation fields. The position location and shape of the radiation fields was reviewed. They targeted volume of tissue appears to be appropriately covered by the radiation beams. Organs at risk appear to be excluded as planned.  Based on my personal review, I approved the simulation verification. The patient's treatment will proceed as planned.  -----------------------------------  Blair Promise, PhD, MD

## 2019-06-27 ENCOUNTER — Other Ambulatory Visit: Payer: Self-pay

## 2019-06-27 ENCOUNTER — Ambulatory Visit: Payer: Medicare HMO | Admitting: Radiation Oncology

## 2019-06-27 ENCOUNTER — Ambulatory Visit
Admission: RE | Admit: 2019-06-27 | Discharge: 2019-06-27 | Disposition: A | Discharge status: Home / Self Care | Payer: Medicare HMO | Source: Ambulatory Visit | Attending: Radiation Oncology | Admitting: Radiation Oncology

## 2019-06-27 DIAGNOSIS — D0511 Intraductal carcinoma in situ of right breast: Secondary | ICD-10-CM

## 2019-06-27 DIAGNOSIS — Z510 Encounter for antineoplastic radiation therapy: Secondary | ICD-10-CM | POA: Diagnosis not present

## 2019-06-27 MED ORDER — ALRA NON-METALLIC DEODORANT (RAD-ONC)
1.0000 "application " | Freq: Once | TOPICAL | Status: AC
  Administered 2019-06-27: 1 via TOPICAL

## 2019-06-27 MED ORDER — SONAFINE EX EMUL
1.0000 "application " | Freq: Two times a day (BID) | CUTANEOUS | Status: DC
  Administered 2019-06-27: 1 via TOPICAL

## 2019-06-28 ENCOUNTER — Other Ambulatory Visit: Payer: Self-pay

## 2019-06-28 ENCOUNTER — Ambulatory Visit
Admission: RE | Admit: 2019-06-28 | Discharge: 2019-06-28 | Disposition: A | Discharge status: Home / Self Care | Payer: Medicare HMO | Source: Ambulatory Visit | Attending: Radiation Oncology | Admitting: Radiation Oncology

## 2019-06-28 DIAGNOSIS — Z510 Encounter for antineoplastic radiation therapy: Secondary | ICD-10-CM | POA: Diagnosis not present

## 2019-06-28 DIAGNOSIS — D0511 Intraductal carcinoma in situ of right breast: Secondary | ICD-10-CM | POA: Diagnosis not present

## 2019-06-29 ENCOUNTER — Other Ambulatory Visit: Payer: Self-pay

## 2019-06-29 ENCOUNTER — Ambulatory Visit
Admission: RE | Admit: 2019-06-29 | Discharge: 2019-06-29 | Disposition: A | Discharge status: Home / Self Care | Payer: Medicare HMO | Source: Ambulatory Visit | Attending: Radiation Oncology | Admitting: Radiation Oncology

## 2019-06-29 DIAGNOSIS — Z510 Encounter for antineoplastic radiation therapy: Secondary | ICD-10-CM | POA: Diagnosis not present

## 2019-06-29 DIAGNOSIS — D0511 Intraductal carcinoma in situ of right breast: Secondary | ICD-10-CM | POA: Diagnosis not present

## 2019-07-03 ENCOUNTER — Ambulatory Visit
Admission: RE | Admit: 2019-07-03 | Discharge: 2019-07-03 | Disposition: A | Discharge status: Home / Self Care | Payer: Medicare HMO | Source: Ambulatory Visit | Attending: Radiation Oncology | Admitting: Radiation Oncology

## 2019-07-03 ENCOUNTER — Other Ambulatory Visit: Payer: Self-pay

## 2019-07-03 DIAGNOSIS — Z170 Estrogen receptor positive status [ER+]: Secondary | ICD-10-CM | POA: Diagnosis not present

## 2019-07-03 DIAGNOSIS — Z510 Encounter for antineoplastic radiation therapy: Secondary | ICD-10-CM | POA: Diagnosis not present

## 2019-07-03 DIAGNOSIS — D0511 Intraductal carcinoma in situ of right breast: Secondary | ICD-10-CM | POA: Insufficient documentation

## 2019-07-04 ENCOUNTER — Ambulatory Visit
Admission: RE | Admit: 2019-07-04 | Discharge: 2019-07-04 | Disposition: A | Discharge status: Home / Self Care | Payer: Medicare HMO | Source: Ambulatory Visit | Attending: Radiation Oncology | Admitting: Radiation Oncology

## 2019-07-04 ENCOUNTER — Encounter: Payer: Self-pay | Admitting: *Deleted

## 2019-07-04 ENCOUNTER — Other Ambulatory Visit: Payer: Self-pay

## 2019-07-04 ENCOUNTER — Telehealth: Payer: Self-pay | Admitting: Hematology and Oncology

## 2019-07-04 DIAGNOSIS — D0511 Intraductal carcinoma in situ of right breast: Secondary | ICD-10-CM | POA: Diagnosis not present

## 2019-07-04 DIAGNOSIS — Z510 Encounter for antineoplastic radiation therapy: Secondary | ICD-10-CM | POA: Diagnosis not present

## 2019-07-04 NOTE — Telephone Encounter (Signed)
R/s appt per 1/5 sch message - pt aware of appt date and time   

## 2019-07-05 ENCOUNTER — Ambulatory Visit
Admission: RE | Admit: 2019-07-05 | Discharge: 2019-07-05 | Disposition: A | Discharge status: Home / Self Care | Payer: Medicare HMO | Source: Ambulatory Visit | Attending: Radiation Oncology | Admitting: Radiation Oncology

## 2019-07-05 ENCOUNTER — Other Ambulatory Visit: Payer: Self-pay

## 2019-07-05 DIAGNOSIS — D0511 Intraductal carcinoma in situ of right breast: Secondary | ICD-10-CM | POA: Diagnosis not present

## 2019-07-05 DIAGNOSIS — Z510 Encounter for antineoplastic radiation therapy: Secondary | ICD-10-CM | POA: Diagnosis not present

## 2019-07-06 ENCOUNTER — Other Ambulatory Visit: Payer: Self-pay

## 2019-07-06 ENCOUNTER — Ambulatory Visit
Admission: RE | Admit: 2019-07-06 | Discharge: 2019-07-06 | Disposition: A | Discharge status: Home / Self Care | Payer: Medicare HMO | Source: Ambulatory Visit | Attending: Radiation Oncology | Admitting: Radiation Oncology

## 2019-07-06 DIAGNOSIS — Z510 Encounter for antineoplastic radiation therapy: Secondary | ICD-10-CM | POA: Diagnosis not present

## 2019-07-06 DIAGNOSIS — D0511 Intraductal carcinoma in situ of right breast: Secondary | ICD-10-CM | POA: Diagnosis not present

## 2019-07-07 ENCOUNTER — Other Ambulatory Visit: Payer: Self-pay

## 2019-07-07 ENCOUNTER — Ambulatory Visit
Admission: RE | Admit: 2019-07-07 | Discharge: 2019-07-07 | Disposition: A | Discharge status: Home / Self Care | Payer: Medicare HMO | Source: Ambulatory Visit | Attending: Radiation Oncology | Admitting: Radiation Oncology

## 2019-07-07 DIAGNOSIS — D0511 Intraductal carcinoma in situ of right breast: Secondary | ICD-10-CM | POA: Diagnosis not present

## 2019-07-07 DIAGNOSIS — Z510 Encounter for antineoplastic radiation therapy: Secondary | ICD-10-CM | POA: Diagnosis not present

## 2019-07-10 ENCOUNTER — Other Ambulatory Visit: Payer: Self-pay

## 2019-07-10 ENCOUNTER — Ambulatory Visit
Admission: RE | Admit: 2019-07-10 | Discharge: 2019-07-10 | Disposition: A | Discharge status: Home / Self Care | Payer: Medicare HMO | Source: Ambulatory Visit | Attending: Radiation Oncology | Admitting: Radiation Oncology

## 2019-07-10 DIAGNOSIS — D0511 Intraductal carcinoma in situ of right breast: Secondary | ICD-10-CM | POA: Diagnosis not present

## 2019-07-10 DIAGNOSIS — Z510 Encounter for antineoplastic radiation therapy: Secondary | ICD-10-CM | POA: Diagnosis not present

## 2019-07-11 ENCOUNTER — Ambulatory Visit
Admission: RE | Admit: 2019-07-11 | Discharge: 2019-07-11 | Disposition: A | Discharge status: Home / Self Care | Payer: Medicare HMO | Source: Ambulatory Visit | Attending: Radiation Oncology | Admitting: Radiation Oncology

## 2019-07-11 ENCOUNTER — Ambulatory Visit: Payer: Medicare HMO | Admitting: Radiation Oncology

## 2019-07-11 ENCOUNTER — Other Ambulatory Visit: Payer: Self-pay

## 2019-07-11 DIAGNOSIS — D0511 Intraductal carcinoma in situ of right breast: Secondary | ICD-10-CM | POA: Diagnosis not present

## 2019-07-11 DIAGNOSIS — Z510 Encounter for antineoplastic radiation therapy: Secondary | ICD-10-CM | POA: Diagnosis not present

## 2019-07-12 ENCOUNTER — Ambulatory Visit
Admission: RE | Admit: 2019-07-12 | Discharge: 2019-07-12 | Disposition: A | Discharge status: Home / Self Care | Payer: Medicare HMO | Source: Ambulatory Visit | Attending: Radiation Oncology | Admitting: Radiation Oncology

## 2019-07-12 ENCOUNTER — Other Ambulatory Visit: Payer: Self-pay

## 2019-07-12 DIAGNOSIS — Z510 Encounter for antineoplastic radiation therapy: Secondary | ICD-10-CM | POA: Diagnosis not present

## 2019-07-12 DIAGNOSIS — D0511 Intraductal carcinoma in situ of right breast: Secondary | ICD-10-CM | POA: Diagnosis not present

## 2019-07-13 ENCOUNTER — Other Ambulatory Visit: Payer: Self-pay

## 2019-07-13 ENCOUNTER — Ambulatory Visit
Admission: RE | Admit: 2019-07-13 | Discharge: 2019-07-13 | Disposition: A | Discharge status: Home / Self Care | Payer: Medicare HMO | Source: Ambulatory Visit | Attending: Radiation Oncology | Admitting: Radiation Oncology

## 2019-07-13 DIAGNOSIS — D0511 Intraductal carcinoma in situ of right breast: Secondary | ICD-10-CM | POA: Diagnosis not present

## 2019-07-13 DIAGNOSIS — Z510 Encounter for antineoplastic radiation therapy: Secondary | ICD-10-CM | POA: Diagnosis not present

## 2019-07-14 ENCOUNTER — Other Ambulatory Visit: Payer: Self-pay

## 2019-07-14 ENCOUNTER — Ambulatory Visit
Admission: RE | Admit: 2019-07-14 | Discharge: 2019-07-14 | Disposition: A | Discharge status: Home / Self Care | Payer: Medicare HMO | Source: Ambulatory Visit | Attending: Radiation Oncology | Admitting: Radiation Oncology

## 2019-07-14 DIAGNOSIS — Z510 Encounter for antineoplastic radiation therapy: Secondary | ICD-10-CM | POA: Diagnosis not present

## 2019-07-14 DIAGNOSIS — D0511 Intraductal carcinoma in situ of right breast: Secondary | ICD-10-CM | POA: Diagnosis not present

## 2019-07-17 ENCOUNTER — Ambulatory Visit
Admission: RE | Admit: 2019-07-17 | Discharge: 2019-07-17 | Disposition: A | Discharge status: Home / Self Care | Payer: Medicare HMO | Source: Ambulatory Visit | Attending: Radiation Oncology | Admitting: Radiation Oncology

## 2019-07-17 ENCOUNTER — Other Ambulatory Visit: Payer: Self-pay

## 2019-07-17 DIAGNOSIS — D0511 Intraductal carcinoma in situ of right breast: Secondary | ICD-10-CM | POA: Diagnosis not present

## 2019-07-17 DIAGNOSIS — Z510 Encounter for antineoplastic radiation therapy: Secondary | ICD-10-CM | POA: Diagnosis not present

## 2019-07-18 ENCOUNTER — Other Ambulatory Visit: Payer: Self-pay

## 2019-07-18 ENCOUNTER — Ambulatory Visit: Payer: Medicare HMO

## 2019-07-18 ENCOUNTER — Ambulatory Visit
Admission: RE | Admit: 2019-07-18 | Discharge: 2019-07-18 | Disposition: A | Discharge status: Home / Self Care | Payer: Medicare HMO | Source: Ambulatory Visit | Attending: Radiation Oncology | Admitting: Radiation Oncology

## 2019-07-18 DIAGNOSIS — Z510 Encounter for antineoplastic radiation therapy: Secondary | ICD-10-CM | POA: Diagnosis not present

## 2019-07-18 DIAGNOSIS — D0511 Intraductal carcinoma in situ of right breast: Secondary | ICD-10-CM | POA: Diagnosis not present

## 2019-07-19 ENCOUNTER — Other Ambulatory Visit: Payer: Self-pay

## 2019-07-19 ENCOUNTER — Ambulatory Visit: Payer: Medicare HMO

## 2019-07-19 ENCOUNTER — Ambulatory Visit
Admission: RE | Admit: 2019-07-19 | Discharge: 2019-07-19 | Disposition: A | Discharge status: Home / Self Care | Payer: Medicare HMO | Source: Ambulatory Visit | Attending: Radiation Oncology | Admitting: Radiation Oncology

## 2019-07-19 DIAGNOSIS — Z510 Encounter for antineoplastic radiation therapy: Secondary | ICD-10-CM | POA: Diagnosis not present

## 2019-07-19 DIAGNOSIS — D0511 Intraductal carcinoma in situ of right breast: Secondary | ICD-10-CM | POA: Diagnosis not present

## 2019-07-20 ENCOUNTER — Ambulatory Visit
Admission: RE | Admit: 2019-07-20 | Discharge: 2019-07-20 | Disposition: A | Discharge status: Home / Self Care | Payer: Medicare HMO | Source: Ambulatory Visit | Attending: Radiation Oncology | Admitting: Radiation Oncology

## 2019-07-20 ENCOUNTER — Other Ambulatory Visit: Payer: Self-pay

## 2019-07-20 DIAGNOSIS — Z510 Encounter for antineoplastic radiation therapy: Secondary | ICD-10-CM | POA: Diagnosis not present

## 2019-07-20 DIAGNOSIS — D0511 Intraductal carcinoma in situ of right breast: Secondary | ICD-10-CM | POA: Diagnosis not present

## 2019-07-20 NOTE — Progress Notes (Signed)
Patient Care Team: [name], MD as PCP - General (Internal Medicine) Mauro Kaufmann, RN as Oncology Nurse Navigator Rockwell Germany, RN as Oncology Nurse Navigator Rolm Bookbinder, MD as Consulting Physician (General Surgery) Nicholas Lose, MD as Consulting Physician (Hematology and Oncology) Gery Pray, MD as Consulting Physician (Radiation Oncology)  DIAGNOSIS:    ICD-10-CM   1. Ductal carcinoma in situ (DCIS) of right breast  D05.11     SUMMARY OF ONCOLOGIC HISTORY: Oncology History  Endometrial ca [facility])  05/17/2014 Initial Diagnosis   Endometrial ca   06/05/2014 Surgery   TRH/BSO and staging. IAUPSC 0/12 nodes, no LVSI   07/12/2014 -  Chemotherapy   paclitaxel and carboplatin   08/21/2014 - 09/18/2014 Radiation Therapy     Uterine cancer ([place]) (Resolved)  05/17/2014 Initial Diagnosis   Uterine cancer    - 11/01/2014 Chemotherapy   Completed 6 cycles of paclitaxel and carboplatin based chemotherapy   06/05/2014 Surgery   IA UPSC    Radiation Therapy     Ductal carcinoma in situ (DCIS) of right breast  05/10/2019 Initial Diagnosis   Routine screening mammogram detected a 0.7cm group of calcifications in the upper outer right breast, a 0.5cm asymmetry in the outer right breast, and no axillary adenopathy. Biopsy DCIS with calcifications, intermediate to high grade, ER+ 90%, PR- 0%.    Genetic Testing   No pathogenic variants identified. VUS in MSH2 called c.5C>T identified on the [facility] STAT Breast Cancer + Common Hereditary Cancers Panel. The report date is 05/23/2019.  The STAT Breast cancer panel offered by [facility] includes sequencing and rearrangement analysis for the following 9 genes:  ATM, BRCA1, BRCA2, CDH1, CHEK2, PALB2, PTEN, STK11 and TP53.    The Common Hereditary Cancers Panel offered by [facility] includes sequencing and/or deletion duplication testing of the following 48 genes: APC, ATM, AXIN2, BARD1, BMPR1A, BRCA1, BRCA2, BRIP1, CDH1, CDKN2A  (p14ARF), CDKN2A (p16INK4a), CKD4, CHEK2, CTNNA1, DICER1, EPCAM (Deletion/duplication testing only), GREM1 (promoter region deletion/duplication testing only), KIT, MEN1, MLH1, MSH2, MSH3, MSH6, MUTYH, NBN, NF1, NHTL1, PALB2, PDGFRA, PMS2, POLD1, POLE, PTEN, RAD50, RAD51C, RAD51D, RNF43, SDHB, SDHC, SDHD, SMAD4, SMARCA4. STK11, TP53, TSC1, TSC2, and VHL.  The following genes were evaluated for sequence changes only: SDHA and HOXB13 c.251G>A variant only.    05/30/2019 Surgery   Right lumpectomy Donne Hazel): intermediate grade DCIS, 0.9cm, clear margins.    05/30/2019 Cancer Staging   Staging form: Breast, AJCC 8th Edition - Pathologic stage from 05/30/2019: Stage 0 (pTis (DCIS), pN0, cM0, ER+, PR-) - Signed by Gardenia Phlegm, NP on 06/14/2019   06/27/2019 -  Radiation Therapy   Adjuvant radiation     CHIEF COMPLIANT: Follow-up of right breast DCIS to discuss antiestrogen therapy  INTERVAL HISTORY: Annette Tucker is a 74 y.o. with above-mentioned history of right breast DCIS who underwent a lumpectomy and is currently undergoing radiation therapy. She presents to the clinic today to discuss antiestrogen therapy.   ALLERGIES:  is allergic to latex and adhesive [tape].  MEDICATIONS:  Current Outpatient Medications  Medication Sig Dispense Refill  . amLODipine (NORVASC) 2.5 MG tablet TAKE 1 TABLET BY MOUTH DAILY 90 tablet 3  . Ascorbic Acid (VITAMIN C) 100 MG tablet Take 500 mg by mouth daily.    . Calcium Carbonate-Vitamin D (CALCIUM 600+D) 600-400 MG-UNIT per tablet Take 1 tablet by mouth 2 (two) times daily. '@lunch'  and in the evening    . cholecalciferol (VITAMIN D) 1000 UNITS tablet Take 1,000 Units by  mouth 2 (two) times daily.     . Coenzyme Q10 (CO Q-10) 100 MG CAPS Take 1 capsule by mouth every morning.     . fish oil-omega-3 fatty acids 1000 MG capsule Take 1 g by mouth every morning.     Marland Kitchen FLAXSEED, LINSEED, PO Take 1,000 mg by mouth daily.    . Magnesium 250 MG TABS  Take 1 tablet by mouth every evening.     . Multiple Vitamins-Minerals (MULTIVITAMIN WITH MINERALS) tablet Take 1 tablet by mouth every morning.     . simvastatin (ZOCOR) 20 MG tablet TAKE ONE TABLET BY MOUTH  AT BEDTIME 90 tablet 3  . tamoxifen (NOLVADEX) 10 MG tablet Take 1 tablet (10 mg total) by mouth daily. 90 tablet 3  . vitamin E 400 UNIT capsule Take 400 Units by mouth every morning.     . zinc gluconate 50 MG tablet Take 50 mg by mouth daily.     No current facility-administered medications for this visit.    PHYSICAL EXAMINATION: ECOG PERFORMANCE STATUS: 1 - Symptomatic but completely ambulatory  Vitals:   07/21/19 1037  BP: (!) 141/90  Pulse: 86  Resp: 19  Temp: 98 F (36.7 C)  SpO2: 99%   Filed Weights   07/21/19 1037  Weight: 122 lb 11.2 oz (55.7 kg)    LABORATORY DATA:  I have reviewed the data as listed CMP Latest Ref Rng & Units 05/17/2019 04/04/2019 03/31/2018  Glucose 70 - 99 mg/dL 87 95 92  BUN 8 - 23 mg/dL '16 15 13  ' Creatinine 0.44 - 1.00 mg/dL 0.83 0.75 0.82  Sodium 135 - 145 mmol/L 141 140 140  Potassium 3.5 - 5.1 mmol/L 4.1 4.5 4.7  Chloride 98 - 111 mmol/L 103 102 102  CO2 22 - 32 mmol/L '26 28 27  ' Calcium 8.9 - 10.3 mg/dL 9.6 9.8 9.5  Total Protein 6.5 - 8.1 g/dL 7.7 7.3 7.0  Total Bilirubin 0.3 - 1.2 mg/dL 0.4 0.5 0.7  Alkaline Phos 38 - 126 U/L 76 - -  AST 15 - 41 U/L '18 20 21  ' ALT 0 - 44 U/L '17 15 18    ' Lab Results  Component Value Date   WBC 4.7 05/17/2019   HGB 14.1 05/17/2019   HCT 42.0 05/17/2019   MCV 90.7 05/17/2019   PLT 230 05/17/2019   NEUTROABS 3.1 05/17/2019    ASSESSMENT & PLAN:  Ductal carcinoma in situ (DCIS) of right breast 05/10/2019:Routine screening mammogram detected a 0.7cm group of calcifications in the upper outer right breast, a 0.5cm asymmetry in the outer right breast, and no axillary adenopathy. Biopsy DCIS with calcifications, intermediate to high grade, ER+ 90%, PR- 0%.  Recommendation: 1. Breast  conserving surgery 05/30/2019:  Rt Lumpectomy: DCIS IG 0.9 cm margins neg, ER 90%, PR 0% 2. Followed by adjuvant radiation therapy 06/27/2019-07/21/2019 3. Followed by antiestrogen therapy with anastrozole 5 years to start 07/31/2019 Genetics consult will need to be arranged  Anastrozole counseling:We discussed the risks and benefits of anti-estrogen therapy with aromatase inhibitors. These include but not limited to insomnia, hot flashes, mood changes, vaginal dryness, bone density loss, and weight gain. We strongly believe that the benefits far outweigh the risks. Patient understands these risks and consented to starting treatment. Planned treatment duration is 5 years.  Return to clinic in 3 months for survivorship care plan visit    No orders of the defined types were placed in this encounter.  The patient has a good understanding  of the overall plan. she agrees with it. she will call with any problems that may develop before the next visit here.  Total time spent: 30 mins including face to face time and time spent for planning, charting and coordination of care  Nicholas Lose, MD 07/21/2019  I, Cloyde Reams Dorshimer, am acting as scribe for Dr. Nicholas Lose.  I have reviewed the above documentation for accuracy and completeness, and I agree with the above.

## 2019-07-21 ENCOUNTER — Telehealth: Payer: Self-pay | Admitting: Hematology and Oncology

## 2019-07-21 ENCOUNTER — Inpatient Hospital Stay: Payer: Medicare HMO | Attending: Gynecologic Oncology | Admitting: Hematology and Oncology

## 2019-07-21 ENCOUNTER — Ambulatory Visit
Admission: RE | Admit: 2019-07-21 | Discharge: 2019-07-21 | Disposition: A | Discharge status: Home / Self Care | Payer: Medicare HMO | Source: Ambulatory Visit | Attending: Radiation Oncology | Admitting: Radiation Oncology

## 2019-07-21 ENCOUNTER — Other Ambulatory Visit: Payer: Self-pay

## 2019-07-21 DIAGNOSIS — Z8542 Personal history of malignant neoplasm of other parts of uterus: Secondary | ICD-10-CM | POA: Insufficient documentation

## 2019-07-21 DIAGNOSIS — D0511 Intraductal carcinoma in situ of right breast: Secondary | ICD-10-CM | POA: Diagnosis not present

## 2019-07-21 DIAGNOSIS — Z923 Personal history of irradiation: Secondary | ICD-10-CM | POA: Insufficient documentation

## 2019-07-21 DIAGNOSIS — Z510 Encounter for antineoplastic radiation therapy: Secondary | ICD-10-CM | POA: Diagnosis not present

## 2019-07-21 DIAGNOSIS — Z9221 Personal history of antineoplastic chemotherapy: Secondary | ICD-10-CM | POA: Insufficient documentation

## 2019-07-21 MED ORDER — TAMOXIFEN CITRATE 10 MG PO TABS: 10.0000 mg | ORAL_TABLET | Freq: Every day | ORAL | 3 refills | Status: DC

## 2019-07-21 NOTE — Telephone Encounter (Signed)
I talk with patient regarding schedule  

## 2019-07-21 NOTE — Assessment & Plan Note (Signed)
05/10/2019:Routine screening mammogram detected a 0.7cm group of calcifications in the upper outer right breast, a 0.5cm asymmetry in the outer right breast, and no axillary adenopathy. Biopsy DCIS with calcifications, intermediate to high grade, ER+ 90%, PR- 0%.  Recommendation: 1. Breast conserving surgery 05/30/2019:  Rt Lumpectomy: DCIS IG 0.9 cm margins neg, ER 90%, PR 0% 2. Followed by adjuvant radiation therapy 06/27/2019-07/21/2019 3. Followed by antiestrogen therapy with anastrozole 5 years to start 07/31/2019 Genetics consult will need to be arranged  Anastrozole counseling:We discussed the risks and benefits of anti-estrogen therapy with aromatase inhibitors. These include but not limited to insomnia, hot flashes, mood changes, vaginal dryness, bone density loss, and weight gain. We strongly believe that the benefits far outweigh the risks. Patient understands these risks and consented to starting treatment. Planned treatment duration is 5 years.  Return to clinic in 3 months for survivorship care plan visit

## 2019-07-24 ENCOUNTER — Ambulatory Visit
Admission: RE | Admit: 2019-07-24 | Discharge: 2019-07-24 | Disposition: A | Discharge status: Home / Self Care | Payer: Medicare HMO | Source: Ambulatory Visit | Attending: Radiation Oncology | Admitting: Radiation Oncology

## 2019-07-24 ENCOUNTER — Other Ambulatory Visit: Payer: Self-pay

## 2019-07-24 DIAGNOSIS — Z510 Encounter for antineoplastic radiation therapy: Secondary | ICD-10-CM | POA: Diagnosis not present

## 2019-07-24 DIAGNOSIS — D0511 Intraductal carcinoma in situ of right breast: Secondary | ICD-10-CM | POA: Diagnosis not present

## 2019-07-25 ENCOUNTER — Encounter: Payer: Self-pay | Admitting: *Deleted

## 2019-07-25 ENCOUNTER — Other Ambulatory Visit: Payer: Self-pay

## 2019-07-25 ENCOUNTER — Encounter: Payer: Self-pay | Admitting: Radiation Oncology

## 2019-07-25 ENCOUNTER — Ambulatory Visit: Payer: Medicare HMO

## 2019-07-25 ENCOUNTER — Ambulatory Visit
Admission: RE | Admit: 2019-07-25 | Discharge: 2019-07-25 | Disposition: A | Discharge status: Home / Self Care | Payer: Medicare HMO | Source: Ambulatory Visit | Attending: Radiation Oncology | Admitting: Radiation Oncology

## 2019-07-25 DIAGNOSIS — Z510 Encounter for antineoplastic radiation therapy: Secondary | ICD-10-CM | POA: Diagnosis not present

## 2019-07-25 DIAGNOSIS — D0511 Intraductal carcinoma in situ of right breast: Secondary | ICD-10-CM | POA: Diagnosis not present

## 2019-07-26 ENCOUNTER — Ambulatory Visit: Payer: Medicare HMO

## 2019-07-26 ENCOUNTER — Ambulatory Visit: Payer: Medicare HMO | Admitting: Hematology and Oncology

## 2019-08-02 NOTE — Progress Notes (Incomplete)
  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 She did report some mild fatigue throughout treatment. At the beginning of treatment, the patient did have some erythema to the upper chest region in and outside of the radiation field with some mild dermatitis. As treatment continued, there was some slight hyperpigmentation to the right breast along with some very slight swelling. On 07/18/2019, the patient had some dermatitis in the inframammary fold and upper aspect of the breast. Patient was given Radiaplex with improvement in skin appearance.  Plan: The patient will follow-up with radiation oncology in one month.  ________________________________________________   Blair Promise, PhD, MD  This document serves as a record of services personally performed by Gery Pray, MD. It was created on his behalf by Clerance Lav, a trained medical scribe. The creation of this record is based on the scribe's personal observations and the provider's statements to them. This document has been  checked and approved by the attending provider.

## 2019-08-21 ENCOUNTER — Other Ambulatory Visit: Payer: Self-pay | Admitting: Internal Medicine

## 2019-08-30 NOTE — Progress Notes (Signed)
Radiation Oncology         [phone] ________________________________  Name: Annette Tucker MRN: [id number]  Date: 08/31/2019  DOB: [birth year]  Follow-Up Visit Note  CC: [name], MD  Gordy Levan, MD    ICD-10-CM   1. 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 She last saw Dr. Lindi Adie for follow up on 07/21/2019. She started anastrozole on 07/31/2019.   On review of systems, she denies pain and reports her radiation-induced fatigue has resolved. She endorses continued use of Radiaplex on the breast.  ALLERGIES:  is allergic to latex and adhesive [tape].  Meds: Current Outpatient Medications  Medication Sig Dispense Refill  . amLODipine (NORVASC) 2.5 MG tablet TAKE 1 TABLET BY MOUTH DAILY 90 tablet 2  . Ascorbic Acid (VITAMIN C) 100 MG tablet Take 500 mg by mouth daily.    . Calcium Carbonate-Vitamin D (CALCIUM 600+D) 600-400 MG-UNIT per tablet Take 1 tablet by mouth 2 (two) times daily. @lunch  and in the evening    . cholecalciferol (VITAMIN D) 1000 UNITS tablet Take 1,000 Units by mouth 2 (two) times daily.     . Coenzyme Q10 (CO Q-10) 100 MG CAPS Take 1 capsule by mouth every morning.     . fish oil-omega-3 fatty acids 1000 MG capsule Take 1 g by mouth every morning.     Marland Kitchen FLAXSEED, LINSEED, PO Take 1,000 mg by mouth daily.    . Magnesium 250 MG TABS Take 1 tablet by mouth every evening.     .  Multiple Vitamins-Minerals (MULTIVITAMIN WITH MINERALS) tablet Take 1 tablet by mouth every morning.     . simvastatin (ZOCOR) 20 MG tablet TAKE ONE TABLET BY MOUTH  AT BEDTIME 90 tablet 3  . tamoxifen (NOLVADEX) 10 MG tablet Take 1 tablet (10 mg total) by mouth daily. 90 tablet 3  . vitamin E 400 UNIT capsule Take 400 Units by mouth every morning.     . zinc gluconate 50 MG tablet Take 50 mg by mouth daily.     No current facility-administered medications for this encounter.    Physical Findings: The patient is in no acute distress. Patient is alert and oriented.  weight is 124 lb 3.2 oz (56.3 kg). Her temperature is 98.2 F (36.8 C). Her blood pressure is 150/90 (abnormal) and her pulse is 90. Her respiration is 20 and oxygen saturation is 100%. .  No significant changes. Lungs are clear to auscultation bilaterally. Heart has regular rate and rhythm. No palpable cervical, supraclavicular, or axillary adenopathy. Abdomen soft, non-tender, normal bowel sounds. Left Breast: no palpable mass, nipple discharge or bleeding. Right Breast: Patient has mild radiation dermatitis in the upper inner aspect of the breast.  He has some very mild edema and mild erythema.  No palpable or visible signs of recurrence  Lab Findings: Lab Results  Component Value Date   WBC 4.7 05/17/2019   HGB 14.1 05/17/2019  HCT 42.0 05/17/2019   MCV 90.7 05/17/2019   PLT 230 05/17/2019    Radiographic Findings: No results found.  Impression:  Right Breast DCIS  The patient is recovering from the effects of radiation.  Overall she is doing quite well  Plan: As needed follow-up in radiation oncology.  Patient will continue follow-up with medical oncology and remain on tamoxifen as above.  ____________________________________ Gery Pray, MD    This document serves as a record of services personally performed by Gery Pray, MD. It was created on his behalf by Wilburn Mylar, a trained medical scribe. The  creation of this record is based on the scribe's personal observations and the provider's statements to them. This document has been checked and approved by the attending provider.

## 2019-08-31 ENCOUNTER — Other Ambulatory Visit: Payer: Self-pay

## 2019-08-31 ENCOUNTER — Ambulatory Visit
Admission: RE | Admit: 2019-08-31 | Discharge: 2019-08-31 | Disposition: A | Discharge status: Home / Self Care | Payer: Medicare HMO | Source: Ambulatory Visit | Attending: Radiation Oncology | Admitting: Radiation Oncology

## 2019-08-31 ENCOUNTER — Encounter: Payer: Self-pay | Admitting: Radiation Oncology

## 2019-08-31 VITALS — BP 150/90 | HR 90 | Temp 98.2°F | Resp 20 | Wt 124.2 lb

## 2019-08-31 DIAGNOSIS — R300 Dysuria: Secondary | ICD-10-CM

## 2019-08-31 DIAGNOSIS — Z510 Encounter for antineoplastic radiation therapy: Secondary | ICD-10-CM | POA: Diagnosis present

## 2019-08-31 DIAGNOSIS — Z170 Estrogen receptor positive status [ER+]: Secondary | ICD-10-CM | POA: Insufficient documentation

## 2019-08-31 DIAGNOSIS — D0511 Intraductal carcinoma in situ of right breast: Secondary | ICD-10-CM | POA: Diagnosis present

## 2019-08-31 LAB — URINALYSIS, COMPLETE (UACMP) WITH MICROSCOPIC
Bacteria, UA: NONE SEEN
Bilirubin Urine: NEGATIVE
Glucose, UA: NEGATIVE mg/dL
Hgb urine dipstick: NEGATIVE
Ketones, ur: NEGATIVE mg/dL
Nitrite: NEGATIVE
Protein, ur: NEGATIVE mg/dL
RBC / HPF: NONE SEEN RBC/hpf (ref 0–5)
Specific Gravity, Urine: <1.005 — ABNORMAL LOW (ref 1.005–1.030)
Squamous Epithelial / HPF: NONE SEEN (ref 0–5)
WBC, UA: NONE SEEN WBC/hpf (ref 0–5)
pH: 7 (ref 5.0–8.0)

## 2019-08-31 NOTE — Progress Notes (Signed)
Ms. Lusby presents today for f/u with Dr. Sondra Come. Pt reports fatigue associated with XRT has resolved. Denies c/o pain. Pt is using Radiaplex on breast. Breast is faintly pink, very slightly swollen. Radiation dermatitis persists on chest area.  BP (!) 150/90 (BP Location: Left Arm, Patient Position: Sitting, Cuff Size: Normal)   Pulse 90   Temp 98.2 F (36.8 C)   Resp 20   Wt 124 lb 3.2 oz (56.3 kg)   SpO2 100%   BMI 22.72 kg/m   Wt Readings from Last 3 Encounters:  08/31/19 124 lb 3.2 oz (56.3 kg)  07/21/19 122 lb 11.2 oz (55.7 kg)  06/12/19 122 lb 8 oz (55.6 kg)   Loma Sousa, RN BSN

## 2019-08-31 NOTE — Patient Instructions (Signed)
Coronavirus (COVID-19) Are you at risk?  Are you at risk for the Coronavirus (COVID-19)?  To be considered HIGH RISK for Coronavirus (COVID-19), you have to meet the following criteria:  . Traveled to [place], [place], [place], [place] or [place]; or in the [place] to [place], [place], [place], or [place]; and have fever, cough, and shortness of breath within the last 2 weeks of travel OR . Been in close contact with a person diagnosed with COVID-19 within the last 2 weeks and have fever, cough, and shortness of breath . IF YOU DO NOT MEET THESE CRITERIA, YOU ARE CONSIDERED LOW RISK FOR COVID-19.  What to do if you are HIGH RISK for COVID-19?  . If you are having a medical emergency, call 911. . Seek medical care right away. Before you go to a doctor's office, urgent care or emergency department, call ahead and tell them about your recent travel, contact with someone diagnosed with COVID-19, and your symptoms. You should receive instructions from your physician's office regarding next steps of care.  . When you arrive at healthcare provider, tell the healthcare staff immediately you have returned from visiting [place], [place], [place], [place] or [place]; or traveled in the [place] to [place], [place], [place], or [place]; in the last two weeks or you have been in close contact with a person diagnosed with COVID-19 in the last 2 weeks.   . Tell the health care staff about your symptoms: fever, cough and shortness of breath. . After you have been seen by a medical provider, you will be either: o Tested for (COVID-19) and discharged home on quarantine except to seek medical care if symptoms worsen, and asked to  - Stay home and avoid contact with others until you get your results (4-5 days)  - Avoid travel on public transportation if possible (such as bus, train, or airplane) or o Sent to the Emergency Department by EMS for evaluation, COVID-19 testing, and possible  admission depending on your condition and test results.  What to do if you are LOW RISK for COVID-19?  Reduce your risk of any infection by using the same precautions used for avoiding the common cold or flu:  . Wash your hands often with soap and warm water for at least 20 seconds.  If soap and water are not readily available, use an alcohol-based hand sanitizer with at least 60% alcohol.  . If coughing or sneezing, cover your mouth and nose by coughing or sneezing into the elbow areas of your shirt or coat, into a tissue or into your sleeve (not your hands). . Avoid shaking hands with others and consider head nods or verbal greetings only. . Avoid touching your eyes, nose, or mouth with unwashed hands.  . Avoid close contact with people who are sick. . Avoid places or events with large numbers of people in one location, like concerts or sporting events. . Carefully consider travel plans you have or are making. . If you are planning any travel outside or inside the [place], visit the CDC's Travelers' Health webpage for the latest health notices. . If you have some symptoms but not all symptoms, continue to monitor at home and seek medical attention if your symptoms worsen. . If you are having a medical emergency, call 911.   ADDITIONAL HEALTHCARE OPTIONS FOR PATIENTS  [facility] / e-Visit: [email or web address]         [facility]: [phone]  [facility]: [phone]                   [facility]: [phone]   

## 2019-09-01 LAB — URINE CULTURE: Culture: <10000 — AB

## 2019-10-25 NOTE — Progress Notes (Signed)
SURVIVORSHIP VISIT:    BRIEF ONCOLOGIC HISTORY:  Oncology History  Endometrial ca ([place])  05/17/2014 Initial Diagnosis   Endometrial ca   06/05/2014 Surgery   TRH/BSO and staging. IAUPSC 0/12 nodes, no LVSI   07/12/2014 -  Chemotherapy   paclitaxel and carboplatin   08/21/2014 - 09/18/2014 Radiation Therapy     Uterine cancer ([place]) (Resolved)  05/17/2014 Initial Diagnosis   Uterine cancer    - 11/01/2014 Chemotherapy   Completed 6 cycles of paclitaxel and carboplatin based chemotherapy   06/05/2014 Surgery   IA UPSC    Radiation Therapy     Ductal carcinoma in situ (DCIS) of right breast  05/10/2019 Initial Diagnosis   Routine screening mammogram detected a 0.7cm group of calcifications in the upper outer right breast, a 0.5cm asymmetry in the outer right breast, and no axillary adenopathy. Biopsy DCIS with calcifications, intermediate to high grade, ER+ 90%, PR- 0%.   05/24/2019 Genetic Testing   No pathogenic variants identified. VUS in MSH2 called c.5C>T identified on the [facility] STAT Breast Cancer + Common Hereditary Cancers Panel. The report date is 05/23/2019.  The STAT Breast cancer panel offered by [facility] includes sequencing and rearrangement analysis for the following 9 genes:  ATM, BRCA1, BRCA2, CDH1, CHEK2, PALB2, PTEN, STK11 and TP53.    The Common Hereditary Cancers Panel offered by [facility] includes sequencing and/or deletion duplication testing of the following 48 genes: APC, ATM, AXIN2, BARD1, BMPR1A, BRCA1, BRCA2, BRIP1, CDH1, CDKN2A (p14ARF), CDKN2A (p16INK4a), CKD4, CHEK2, CTNNA1, DICER1, EPCAM (Deletion/duplication testing only), GREM1 (promoter region deletion/duplication testing only), KIT, MEN1, MLH1, MSH2, MSH3, MSH6, MUTYH, NBN, NF1, NHTL1, PALB2, PDGFRA, PMS2, POLD1, POLE, PTEN, RAD50, RAD51C, RAD51D, RNF43, SDHB, SDHC, SDHD, SMAD4, SMARCA4. STK11, TP53, TSC1, TSC2, and VHL.  The following genes were evaluated for sequence changes only: SDHA and HOXB13  c.251G>A variant only.    05/30/2019 Surgery   Right lumpectomy Annette Tucker) [phone]): intermediate grade DCIS, 0.9cm, clear margins. ER+, PR-. No regional lymph nodes were examined.   05/30/2019 Cancer Staging   Staging form: Breast, AJCC 8th Edition - Pathologic stage from 05/30/2019: Stage 0 (pTis (DCIS), pN0, cM0, ER+, PR-)    06/26/2019 - 07/25/2019 Radiation Therapy   The patient initially received a dose of 40.05 Gy in 15 fractions to the breast using whole-breast tangent fields. This was delivered using a 3-D conformal technique. The pt received a boost delivering an additional 12 Gy in 6 fractions using a electron boost with 72mV electrons. The total dose was 52.05 Gy.   07/2019 - 07/2024 Anti-estrogen oral therapy   Tamoxifen daily     INTERVAL HISTORY:  Annette Tucker review her survivorship care plan detailing her treatment course for breast cancer, as well as monitoring long-term side effects of that treatment, education regarding health maintenance, screening, and overall wellness and health promotion.     Overall, Annette Tucker reports feeling quite well.  She is taking Tamoxifen daily and is tolerating this well.  She is having some vaginal discharge.  She has manageable hot flashes, and these are tolerable.  She has no arthralgias.  She completed the survivorship survey today and this was scanned into the system.    REVIEW OF SYSTEMS:  Review of Systems  Constitutional: Negative for appetite change, chills, fatigue, fever and unexpected weight change.  HENT:   Negative for hearing loss, lump/mass and trouble swallowing.   Eyes: Negative for eye problems and icterus.  Respiratory: Negative for chest tightness, cough and shortness of breath.  Cardiovascular: Negative for chest pain, leg swelling and palpitations.  Gastrointestinal: Negative for abdominal distention and abdominal pain.  Endocrine: Positive for hot flashes.  Genitourinary: Positive for vaginal discharge  (related to tamoxifen). Negative for difficulty urinating and dyspareunia.   Musculoskeletal: Negative for arthralgias.  Skin: Negative for itching.  Neurological: Positive for numbness (related to chemotherapy from endometrial cancer). Negative for dizziness.  Hematological: Negative for adenopathy. Does not bruise/bleed easily.  Psychiatric/Behavioral: Negative for depression. The patient is not nervous/anxious.    Breast: Denies any new nodularity, masses, tenderness, nipple changes, or nipple discharge.      ONCOLOGY TREATMENT TEAM:  1. Surgeon:  Dr. Donne Tucker at [facility] 2. Medical Oncologist: Dr. Lindi Adie  3. Radiation Oncologist: Dr. Sondra Come    PAST MEDICAL/SURGICAL HISTORY:  Past Medical History:  Diagnosis Date  . Endometrial cancer ([place])    2015  . Family history of bone cancer   . Family history of breast cancer   . Heart murmur    as child, no problems   . Hyperlipidemia   . Hypertension   . Radiation 08/21/14, 08/28/14, 09/04/14, 09/06/14, 09/18/14   vaginal cuff brachytherapy 30 gray  . Seasonal allergies    Past Surgical History:  Procedure Laterality Date  . BREAST LUMPECTOMY WITH RADIOACTIVE SEED LOCALIZATION Right 05/30/2019   Procedure: RIGHT BREAST LUMPECTOMY WITH RADIOACTIVE SEED LOCALIZATION;  Surgeon: Annette Bookbinder, MD;  Location: [facility];  Service: General;  Laterality: Right;  . BREAST SURGERY     left breast bx - benign  . COLONOSCOPY  2017  . DILATATION & CURETTAGE/HYSTEROSCOPY WITH TRUECLEAR N/A 05/17/2014   Procedure: HYSTEROSCOPY WITH POLYPECTOMY;  Surgeon: Annette Billings A. Pamala Hurry, MD;  Location: [facility];  Service: Gynecology;  Laterality: N/A;  . LYMPH NODE DISSECTION N/A 06/05/2014   Procedure: LYMPH NODE DISSECTION;  Surgeon: Imagene Gurney A. Alycia Rossetti, MD;  Location: [facility];  Service: Gynecology;  Laterality: N/A;  . ROBOTIC ASSISTED TOTAL HYSTERECTOMY WITH BILATERAL SALPINGO OOPHERECTOMY N/A 06/05/2014   Procedure: ROBOTIC  ASSISTED TOTAL HYSTERECTOMY WITH BILATERAL SALPINGO OOPHORECTOMY;  Surgeon: Imagene Gurney A. Alycia Rossetti, MD;  Location: [facility];  Service: Gynecology;  Laterality: N/A;  . tmj mandibular advancement    . TONSILLECTOMY     age 74  . WISDOM TOOTH EXTRACTION       ALLERGIES:  Allergies  Allergen Reactions  . Latex Rash  . Adhesive [Tape]     Skin gets red      CURRENT MEDICATIONS:  Outpatient Encounter Medications as of 10/26/2019  Medication Sig Note  . amLODipine (NORVASC) 2.5 MG tablet TAKE 1 TABLET BY MOUTH DAILY   . ascorbic acid (VITAMIN C) 500 MG tablet Take 500 mg by mouth daily.   . Calcium Carbonate-Vitamin D (CALCIUM 600+D) 600-400 MG-UNIT per tablet Take 1 tablet by mouth 2 (two) times daily. '@lunch'  and in the evening   . cholecalciferol (VITAMIN D) 1000 UNITS tablet Take 1,000 Units by mouth 2 (two) times daily.  07/05/2014: Taking daily  . Coenzyme Q10 (CO Q-10) 100 MG CAPS Take 1 capsule by mouth every morning.    . fish oil-omega-3 fatty acids 1000 MG capsule Take 1 g by mouth every morning.    Marland Kitchen FLAXSEED, LINSEED, PO Take 1,000 mg by mouth daily.   . Magnesium 250 MG TABS Take 1 tablet by mouth every evening.    . Multiple Vitamins-Minerals (MULTIVITAMIN WITH MINERALS) tablet Take 1 tablet by mouth every morning.    . simvastatin (ZOCOR) 20 MG tablet  TAKE ONE TABLET BY MOUTH  AT BEDTIME   . tamoxifen (NOLVADEX) 10 MG tablet Take 1 tablet (10 mg total) by mouth daily.   . vitamin E 400 UNIT capsule Take 400 Units by mouth every morning.    . zinc gluconate 50 MG tablet Take 50 mg by mouth daily.   . [DISCONTINUED] Ascorbic Acid (VITAMIN C) 100 MG tablet Take 500 mg by mouth daily.    No facility-administered encounter medications on file as of 10/26/2019.     ONCOLOGIC FAMILY HISTORY:  Family History  Problem Relation Age of Onset  . Mental retardation Mother   . Kidney disease Father   . Stroke Father   . Cancer Maternal Uncle        unknown  . Breast cancer Maternal Aunt         dx 64s  . Bone cancer Paternal Uncle   . Breast cancer Cousin        pat cousins x2, dx in their 39s  . Bone cancer Maternal Uncle      GENETIC COUNSELING/TESTING: See above  SOCIAL HISTORY:  Social History   Socioeconomic History  . Marital status: Single    Spouse name: Not on file  . Number of children: 0  . Years of education: Not on file  . Highest education level: Not on file  Occupational History  . Occupation: retired Psychologist, prison and probation services  . Smoking status: Never Smoker  . Smokeless tobacco: Never Used  Substance and Sexual Activity  . Alcohol use: Not Currently    Comment: rarely  . Drug use: No  . Sexual activity: Never    Birth control/protection: Post-menopausal  Other Topics Concern  . Not on file  Social History Narrative  . Not on file   Social Determinants of Health   Financial Resource Strain:   . Difficulty of Paying Living Expenses:   Food Insecurity:   . Worried About Charity fundraiser in the Last Year:   . Arboriculturist in the Last Year:   Transportation Needs:   . Film/video editor (Medical):   Marland Kitchen Lack of Transportation (Non-Medical):   Physical Activity:   . Days of Exercise per Week:   . Minutes of Exercise per Session:   Stress:   . Feeling of Stress :   Social Connections:   . Frequency of Communication with Friends and Family:   . Frequency of Social Gatherings with Friends and Family:   . Attends Religious Services:   . Active Member of Clubs or Organizations:   . Attends Archivist Meetings:   Marland Kitchen Marital Status:   Intimate Partner Violence:   . Fear of Current or Ex-Partner:   . Emotionally Abused:   Marland Kitchen Physically Abused:   . Sexually Abused:      OBSERVATIONS/OBJECTIVE:  BP 130/88 (BP Location: Left Arm, Patient Position: Sitting)   Pulse 85   Temp 98.3 F (36.8 C) (Temporal)   Resp 17   Ht '5\' 2"'  (1.575 m)   Wt 125 lb (56.7 kg)   SpO2 99%   BMI 22.86 kg/m  GENERAL: Patient is a well  appearing female in no acute distress HEENT:  Sclerae anicteric.  Oropharynx clear and moist. No ulcerations or evidence of oropharyngeal candidiasis. Neck is supple.  NODES:  No cervical, supraclavicular, or axillary lymphadenopathy palpated.  BREAST EXAM:  Right breast s/p lumpectomy and radiation, no sign of local recurrence, left breast benign LUNGS:  Clear  to auscultation bilaterally.  No wheezes or rhonchi. HEART:  Regular rate and rhythm. No murmur appreciated. ABDOMEN:  Soft, nontender.  Positive, normoactive bowel sounds. No organomegaly palpated. MSK:  No focal spinal tenderness to palpation. Full range of motion bilaterally in the upper extremities. EXTREMITIES:  No peripheral edema.   SKIN:  Clear with no obvious rashes or skin changes. No nail dyscrasia. NEURO:  Nonfocal. Well oriented.  Appropriate affect.   LABORATORY DATA:  None for this visit.  DIAGNOSTIC IMAGING:  None for this visit.      ASSESSMENT AND PLAN:  Ms.. Annette Tucker is a pleasant 74 y.o. female with Stage 0 right breast DCIS, ER+/PR-, diagnosed in 04/2019, treated with lumpectomy, adjuvant radiation therapy, and anti-estrogen therapy with Tamoxifen beginning in 07/2019.  She presents to the Survivorship Clinic for our initial meeting and routine follow-up post-completion of treatment for breast cancer.    1. Stage 0 right breast cancer:  Ms. Muma is continuing to recover from definitive treatment for breast cancer. She will follow-up with her medical oncologist, Dr. Lindi Adie in 12/2019 with history and physical exam per surveillance protocol.  She will continue her anti-estrogen therapy with Tamoxifen. Thus far, she is tolerating the Tamoxifen well, with only mild vaginal discharge and hot flashes that are tolerable.  Her mammogram is due 04/2020; orders placed today. Today, a comprehensive survivorship care plan and treatment summary was reviewed with the patient today detailing her breast cancer diagnosis, treatment  course, potential late/long-term effects of treatment, appropriate follow-up care with recommendations for the future, and patient education resources.  A copy of this summary, along with a letter will be sent to the patient's primary care provider via mail/fax/In Basket message after today's visit.    2. Bone health:  Given Ms. Colpitts's age/history of breast cancer, she is at risk for bone demineralization.  Her recent bone density showed osteopenia, and she is due for this again in 04/2021.  I counseled her that tamoxifen has a protective effect on the bones. She was given education on specific activities to promote bone health.  3. Cancer screening:  Due to Ms. Caperton's history and her age, she should receive screening for skin cancers, colon cancer, and gynecologic cancers.  The information and recommendations are listed on the patient's comprehensive care plan/treatment summary and were reviewed in detail with the patient.    4. Health maintenance and wellness promotion: Ms. Mullen was encouraged to consume 5-7 servings of fruits and vegetables per day. We reviewed the "Nutrition Rainbow" handout, as well as the handout "Take Control of Your Health and Reduce Your Cancer Risk" from the [employer].  She was also encouraged to engage in moderate to vigorous exercise for 30 minutes per day most days of the week. We discussed the [employer] fitness program, which is designed for cancer survivors to help them become more physically fit after cancer treatments.  She was instructed to limit her alcohol consumption and continue to abstain from tobacco use.     5. Support services/counseling: It is not uncommon for this period of the patient's cancer care trajectory to be one of many emotions and stressors.  We discussed how this can be increasingly difficult during the times of quarantine and social distancing due to the COVID-19 pandemic.   She was given information regarding our available  services and encouraged to contact me with any questions or for help enrolling in any of our support group/programs.    Follow up instructions:    -  Return to cancer center for f/u with Dr. Lindi Adie in 12/2019  -Mammogram due in 04/2020 -Follow up with Dr. Donne Tucker 06/2020 -She is welcome to return back to the Survivorship Clinic at any time; no additional follow-up needed at this time.  -Consider referral back to survivorship as a long-term survivor for continued surveillance  The patient was provided an opportunity to ask questions and all were answered. The patient agreed with the plan and demonstrated an understanding of the instructions.   Total encounter time: 45 minutes*  Wilber Bihari, NP 10/26/19 10:59 AM Medical Oncology and Hematology [facility], [place] [address] Tel. [phone]    Fax. [phone]  *Total Encounter Time as defined by the Centers for Medicare and Medicaid Services includes, in addition to the face-to-face time of a patient visit (documented in the note above) non-face-to-face time: obtaining and reviewing outside history, ordering and reviewing medications, tests or procedures, care coordination (communications with other health care professionals or caregivers) and documentation in the medical record.

## 2019-10-26 ENCOUNTER — Telehealth: Payer: Self-pay

## 2019-10-26 ENCOUNTER — Inpatient Hospital Stay: Payer: Medicare HMO | Attending: Adult Health | Admitting: Adult Health

## 2019-10-26 ENCOUNTER — Encounter: Payer: Self-pay | Admitting: Adult Health

## 2019-10-26 ENCOUNTER — Other Ambulatory Visit: Payer: Self-pay

## 2019-10-26 VITALS — BP 130/88 | HR 85 | Temp 98.3°F | Resp 17 | Ht 62.0 in | Wt 125.0 lb

## 2019-10-26 DIAGNOSIS — Z923 Personal history of irradiation: Secondary | ICD-10-CM | POA: Insufficient documentation

## 2019-10-26 DIAGNOSIS — Z9221 Personal history of antineoplastic chemotherapy: Secondary | ICD-10-CM | POA: Diagnosis not present

## 2019-10-26 DIAGNOSIS — Z9079 Acquired absence of other genital organ(s): Secondary | ICD-10-CM | POA: Insufficient documentation

## 2019-10-26 DIAGNOSIS — Z8542 Personal history of malignant neoplasm of other parts of uterus: Secondary | ICD-10-CM | POA: Insufficient documentation

## 2019-10-26 DIAGNOSIS — Z170 Estrogen receptor positive status [ER+]: Secondary | ICD-10-CM | POA: Diagnosis not present

## 2019-10-26 DIAGNOSIS — D0511 Intraductal carcinoma in situ of right breast: Secondary | ICD-10-CM

## 2019-10-26 DIAGNOSIS — N951 Menopausal and female climacteric states: Secondary | ICD-10-CM | POA: Diagnosis not present

## 2019-10-26 DIAGNOSIS — Z808 Family history of malignant neoplasm of other organs or systems: Secondary | ICD-10-CM | POA: Insufficient documentation

## 2019-10-26 DIAGNOSIS — Z90710 Acquired absence of both cervix and uterus: Secondary | ICD-10-CM | POA: Diagnosis not present

## 2019-10-26 DIAGNOSIS — Z90722 Acquired absence of ovaries, bilateral: Secondary | ICD-10-CM | POA: Insufficient documentation

## 2019-10-26 DIAGNOSIS — Z803 Family history of malignant neoplasm of breast: Secondary | ICD-10-CM | POA: Diagnosis not present

## 2019-10-26 DIAGNOSIS — Z79810 Long term (current) use of selective estrogen receptor modulators (SERMs): Secondary | ICD-10-CM | POA: Insufficient documentation

## 2019-10-26 DIAGNOSIS — N898 Other specified noninflammatory disorders of vagina: Secondary | ICD-10-CM | POA: Diagnosis not present

## 2019-10-26 NOTE — Telephone Encounter (Signed)
Per Suzan Garibaldi NP referral faxed  to [facility] for mammogram to be scheduled in November

## 2019-10-27 ENCOUNTER — Telehealth: Payer: Self-pay | Admitting: Hematology and Oncology

## 2019-10-27 NOTE — Telephone Encounter (Signed)
Scheduled appts per 4/29 los. Pt confirmed appt date and time.

## 2019-11-02 NOTE — Progress Notes (Signed)
Scheduled CPE and lab

## 2019-12-21 DIAGNOSIS — R69 Illness, unspecified: Secondary | ICD-10-CM | POA: Diagnosis not present

## 2020-01-17 ENCOUNTER — Other Ambulatory Visit: Payer: Self-pay

## 2020-01-17 ENCOUNTER — Inpatient Hospital Stay: Payer: Medicare HMO | Attending: Adult Health | Admitting: Hematology and Oncology

## 2020-01-17 ENCOUNTER — Telehealth: Payer: Self-pay | Admitting: Hematology and Oncology

## 2020-01-17 DIAGNOSIS — Z79810 Long term (current) use of selective estrogen receptor modulators (SERMs): Secondary | ICD-10-CM | POA: Diagnosis not present

## 2020-01-17 DIAGNOSIS — Z923 Personal history of irradiation: Secondary | ICD-10-CM | POA: Insufficient documentation

## 2020-01-17 DIAGNOSIS — D0511 Intraductal carcinoma in situ of right breast: Secondary | ICD-10-CM | POA: Insufficient documentation

## 2020-01-17 NOTE — Assessment & Plan Note (Signed)
05/10/2019:Routine screening mammogram detected a 0.7cm group of calcifications in the upper outer right breast, a 0.5cm asymmetry in the outer right breast, and no axillary adenopathy. Biopsy DCIS with calcifications, intermediate to high grade, ER+ 90%, PR- 0%.  Recommendation: 1. Breast conserving surgery12/06/2018: Rt Lumpectomy: DCIS IG 0.9 cm margins neg, ER 90%, PR 0% 2. Followed by adjuvant radiation therapy 06/27/2019-07/21/2019 3. Followed by antiestrogen therapy withanastrozole5 years to start 07/31/2019 Genetics consult Neg ------------------------------------------------------------------------------------------------------------------------------------- Anastrozole Toxicities:   Breast Cancer Surveillance: 1. Breast Exam: 7/21/1: Benign 2. Mammograms: to be done in October  RTC in 1 year

## 2020-01-17 NOTE — Telephone Encounter (Signed)
Scheduled appts per 7/21 los. Gave pt a print out of AVS.

## 2020-01-17 NOTE — Progress Notes (Signed)
Patient Care Team: [name], MD as PCP - General (Internal Medicine) Rolm Bookbinder, MD as Consulting Physician (General Surgery) Nicholas Lose, MD as Consulting Physician (Hematology and Oncology) Gery Pray, MD as Consulting Physician (Radiation Oncology)  DIAGNOSIS:    ICD-10-CM   1. Ductal carcinoma in situ (DCIS) of right breast  D05.11     SUMMARY OF ONCOLOGIC HISTORY: Oncology History  Endometrial ca [facility])  05/17/2014 Initial Diagnosis   Endometrial ca   06/05/2014 Surgery   TRH/BSO and staging. IAUPSC 0/12 nodes, no LVSI   07/12/2014 -  Chemotherapy   paclitaxel and carboplatin   08/21/2014 - 09/18/2014 Radiation Therapy     Uterine cancer ([place]) (Resolved)  05/17/2014 Initial Diagnosis   Uterine cancer    - 11/01/2014 Chemotherapy   Completed 6 cycles of paclitaxel and carboplatin based chemotherapy   06/05/2014 Surgery   IA UPSC    Radiation Therapy     Ductal carcinoma in situ (DCIS) of right breast  05/10/2019 Initial Diagnosis   Routine screening mammogram detected a 0.7cm group of calcifications in the upper outer right breast, a 0.5cm asymmetry in the outer right breast, and no axillary adenopathy. Biopsy DCIS with calcifications, intermediate to high grade, ER+ 90%, PR- 0%.   05/24/2019 Genetic Testing   No pathogenic variants identified. VUS in MSH2 called c.5C>T identified on the [facility] STAT Breast Cancer + Common Hereditary Cancers Panel. The report date is 05/23/2019.  The STAT Breast cancer panel offered by [facility] includes sequencing and rearrangement analysis for the following 9 genes:  ATM, BRCA1, BRCA2, CDH1, CHEK2, PALB2, PTEN, STK11 and TP53.    The Common Hereditary Cancers Panel offered by [facility] includes sequencing and/or deletion duplication testing of the following 48 genes: APC, ATM, AXIN2, BARD1, BMPR1A, BRCA1, BRCA2, BRIP1, CDH1, CDKN2A (p14ARF), CDKN2A (p16INK4a), CKD4, CHEK2, CTNNA1, DICER1, EPCAM (Deletion/duplication  testing only), GREM1 (promoter region deletion/duplication testing only), KIT, MEN1, MLH1, MSH2, MSH3, MSH6, MUTYH, NBN, NF1, NHTL1, PALB2, PDGFRA, PMS2, POLD1, POLE, PTEN, RAD50, RAD51C, RAD51D, RNF43, SDHB, SDHC, SDHD, SMAD4, SMARCA4. STK11, TP53, TSC1, TSC2, and VHL.  The following genes were evaluated for sequence changes only: SDHA and HOXB13 c.251G>A variant only.    05/30/2019 Surgery   Right lumpectomy Donne Hazel) [phone]): intermediate grade DCIS, 0.9cm, clear margins. ER+, PR-. No regional lymph nodes were examined.   05/30/2019 Cancer Staging   Staging form: Breast, AJCC 8th Edition - Pathologic stage from 05/30/2019: Stage 0 (pTis (DCIS), pN0, cM0, ER+, PR-)    06/26/2019 - 07/25/2019 Radiation Therapy   The patient initially received a dose of 40.05 Gy in 15 fractions to the breast using whole-breast tangent fields. This was delivered using a 3-D conformal technique. The pt received a boost delivering an additional 12 Gy in 6 fractions using a electron boost with 60mV electrons. The total dose was 52.05 Gy.   07/2019 - 07/2024 Anti-estrogen oral therapy   Tamoxifen daily     CHIEF COMPLIANT: Follow-up of right breast DCIS on tamoxifen  INTERVAL HISTORY: Annette Olivieris a 74y.o. with above-mentioned history of right breast DCIS who underwent a lumpectomy, radiation, and is currently on antiestrogen therapy with tamoxifen. She presents to the clinic today for follow-up.   ALLERGIES:  is allergic to latex and adhesive [tape].  MEDICATIONS:  Current Outpatient Medications  Medication Sig Dispense Refill  . amLODipine (NORVASC) 2.5 MG tablet TAKE 1 TABLET BY MOUTH DAILY 90 tablet 2  . ascorbic acid (VITAMIN C) 500 MG tablet Take  500 mg by mouth daily.    . Calcium Carbonate-Vitamin D (CALCIUM 600+D) 600-400 MG-UNIT per tablet Take 1 tablet by mouth 2 (two) times daily. _0  and in the evening    . cholecalciferol (VITAMIN D) 1000 UNITS tablet Take 1,000 Units by mouth  2 (two) times daily.     . Coenzyme Q10 (CO Q-10) 100 MG CAPS Take 1 capsule by mouth every morning.     . fish oil-omega-3 fatty acids 1000 MG capsule Take 1 g by mouth every morning.     Marland Kitchen FLAXSEED, LINSEED, PO Take 1,000 mg by mouth daily.    . Magnesium 250 MG TABS Take 1 tablet by mouth every evening.     . Multiple Vitamins-Minerals (MULTIVITAMIN WITH MINERALS) tablet Take 1 tablet by mouth every morning.     . simvastatin (ZOCOR) 20 MG tablet TAKE ONE TABLET BY MOUTH  AT BEDTIME 90 tablet 3  . tamoxifen (NOLVADEX) 10 MG tablet Take 1 tablet (10 mg total) by mouth daily. 90 tablet 3  . vitamin E 400 UNIT capsule Take 400 Units by mouth every morning.     . zinc gluconate 50 MG tablet Take 50 mg by mouth daily.     No current facility-administered medications for this visit.    PHYSICAL EXAMINATION: ECOG PERFORMANCE STATUS: 1 - Symptomatic but completely ambulatory  Vitals:   01/17/20 1016  BP: (!) 143/81  Pulse: 90  Resp: 17  Temp: 98.9 F (37.2 C)  SpO2: 99%   Filed Weights   01/17/20 1016  Weight: 124 lb 1.6 oz (56.3 kg)    BREAST: No palpable masses or nodules in either right or left breasts. No palpable axillary supraclavicular or infraclavicular adenopathy no breast tenderness or nipple discharge. (exam performed in the presence of a chaperone)  LABORATORY DATA:  I have reviewed the data as listed CMP Latest Ref Rng & Units 05/17/2019 04/04/2019 03/31/2018  Glucose 70 - 99 mg/dL 87 95 92  BUN 8 - 23 mg/dL _1 Creatinine 0.44 - 1.00 mg/dL 0.83 0.75 0.82  Sodium 135 - 145 mmol/L 141 140 140  Potassium 3.5 - 5.1 mmol/L 4.1 4.5 4.7  Chloride 98 - 111 mmol/L 103 102 102  CO2 22 - 32 mmol/L _2 Calcium 8.9 - 10.3 mg/dL 9.6 9.8 9.5  Total Protein 6.5 - 8.1 g/dL 7.7 7.3 7.0  Total Bilirubin 0.3 - 1.2 mg/dL 0.4 0.5 0.7  Alkaline Phos 38 - 126 U/L 76 - -  AST 15 - 41 U/L _3 ALT 0 - 44 U/L _4 Lab Results  Component Value Date   WBC 4.7  05/17/2019   HGB 14.1 05/17/2019   HCT 42.0 05/17/2019   MCV 90.7 05/17/2019   PLT 230 05/17/2019   NEUTROABS 3.1 05/17/2019    ASSESSMENT & PLAN:  Ductal carcinoma in situ (DCIS) of right breast 05/10/2019:Routine screening mammogram detected a 0.7cm group of calcifications in the upper outer right breast, a 0.5cm asymmetry in the outer right breast, and no axillary adenopathy. Biopsy DCIS with calcifications, intermediate to high grade, ER+ 90%, PR- 0%.  Recommendation: 1. Breast conserving surgery12/06/2018: Rt Lumpectomy: DCIS IG 0.9 cm margins neg, ER 90%, PR 0% 2. Followed by adjuvant radiation therapy 06/27/2019-07/21/2019 3. Followed by antiestrogen therapy withtamoxifen5 years to start 07/31/2019 Genetics consult Neg ------------------------------------------------------------------------------------------------------------------------------------- Tamoxifen toxicities: Occasional hot flashes Patient is tolerating 10 mg of tamoxifen extremely well. She wants to stay  on this dosage.  Breast Cancer Surveillance: 1. Breast Exam: 7/21/1: Benign 2. Mammograms: to be done in October  RTC in 1 year    No orders of the defined types were placed in this encounter.  The patient has a good understanding of the overall plan. she agrees with it. she will call with any problems that may develop before the next visit here.  Total time spent: 20 mins including face to face time and time spent for planning, charting and coordination of care  Nicholas Lose, MD 01/17/2020  I, Cloyde Reams Dorshimer, am acting as scribe for Dr. Nicholas Lose.  I have reviewed the above documentation for accuracy and completeness, and I agree with the above.

## 2020-04-04 ENCOUNTER — Other Ambulatory Visit: Payer: Self-pay

## 2020-04-04 ENCOUNTER — Other Ambulatory Visit: Payer: Medicare HMO | Admitting: Internal Medicine

## 2020-04-04 DIAGNOSIS — T451X5A Adverse effect of antineoplastic and immunosuppressive drugs, initial encounter: Secondary | ICD-10-CM | POA: Diagnosis not present

## 2020-04-04 DIAGNOSIS — E7849 Other hyperlipidemia: Secondary | ICD-10-CM

## 2020-04-04 DIAGNOSIS — I10 Essential (primary) hypertension: Secondary | ICD-10-CM | POA: Diagnosis not present

## 2020-04-04 DIAGNOSIS — Z8542 Personal history of malignant neoplasm of other parts of uterus: Secondary | ICD-10-CM | POA: Diagnosis not present

## 2020-04-04 DIAGNOSIS — Z0000 Encounter for general adult medical examination without abnormal findings: Secondary | ICD-10-CM

## 2020-04-04 DIAGNOSIS — G620 Drug-induced polyneuropathy: Secondary | ICD-10-CM | POA: Diagnosis not present

## 2020-04-04 DIAGNOSIS — R030 Elevated blood-pressure reading, without diagnosis of hypertension: Secondary | ICD-10-CM | POA: Diagnosis not present

## 2020-04-04 DIAGNOSIS — G609 Hereditary and idiopathic neuropathy, unspecified: Secondary | ICD-10-CM | POA: Diagnosis not present

## 2020-04-04 DIAGNOSIS — M8580 Other specified disorders of bone density and structure, unspecified site: Secondary | ICD-10-CM

## 2020-04-09 ENCOUNTER — Other Ambulatory Visit: Payer: Self-pay

## 2020-04-09 ENCOUNTER — Encounter: Payer: Self-pay | Admitting: Internal Medicine

## 2020-04-09 ENCOUNTER — Ambulatory Visit (INDEPENDENT_AMBULATORY_CARE_PROVIDER_SITE_OTHER): Payer: Medicare HMO | Admitting: Internal Medicine

## 2020-04-09 VITALS — BP 140/80 | HR 80 | Temp 98.2°F | Ht 62.0 in | Wt 121.0 lb

## 2020-04-09 DIAGNOSIS — M8580 Other specified disorders of bone density and structure, unspecified site: Secondary | ICD-10-CM

## 2020-04-09 DIAGNOSIS — Z8542 Personal history of malignant neoplasm of other parts of uterus: Secondary | ICD-10-CM

## 2020-04-09 DIAGNOSIS — G620 Drug-induced polyneuropathy: Secondary | ICD-10-CM

## 2020-04-09 DIAGNOSIS — Z0000 Encounter for general adult medical examination without abnormal findings: Secondary | ICD-10-CM | POA: Diagnosis not present

## 2020-04-09 DIAGNOSIS — D0511 Intraductal carcinoma in situ of right breast: Secondary | ICD-10-CM

## 2020-04-09 DIAGNOSIS — I10 Essential (primary) hypertension: Secondary | ICD-10-CM

## 2020-04-09 DIAGNOSIS — T451X5A Adverse effect of antineoplastic and immunosuppressive drugs, initial encounter: Secondary | ICD-10-CM

## 2020-04-09 DIAGNOSIS — G609 Hereditary and idiopathic neuropathy, unspecified: Secondary | ICD-10-CM

## 2020-04-09 LAB — POCT URINALYSIS DIPSTICK
Appearance: NEGATIVE
Bilirubin, UA: NEGATIVE
Blood, UA: NEGATIVE
Glucose, UA: NEGATIVE
Ketones, UA: NEGATIVE
Leukocytes, UA: NEGATIVE
Nitrite, UA: NEGATIVE
Odor: NEGATIVE
Protein, UA: NEGATIVE
Spec Grav, UA: 1.015 (ref 1.010–1.025)
Urobilinogen, UA: 0.2 E.U./dL
pH, UA: 6.5 (ref 5.0–8.0)

## 2020-04-09 NOTE — Progress Notes (Signed)
Subjective:    Patient ID: Annette Tucker, female    DOB: 1945/12/28, 74 y.o.   MRN: [id number]  HPI 74 year old Female for Medicare wellness, health maintenance exam and evaluation of medical issues.  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  Mammogram detected calcifications in the upper outer right breast.  Biopsy showed DCIS with calcifications.  Intermediate to high-grade.  ER positive PR negative.  Had genetic testing with no pathogenic variants identified.  Patient is being followed by Dr. Lindi Adie and had radiation therapy.  She also had right lobectomy May 30, 2019 with Dr. Donne Hazel.  Will be on anastrozole for 5 years.  History of hypertension well-controlled on low-dose amlodipine.  History of hyperlipidemia treated with Zocor 20 mg daily.  History of small retinal hemorrhage followed by Dr. Marica Otter.  In 2015, she noted vaginal spotting.  She had a D&C and it revealed endometrial cancer and was referred to GYN oncologist.  Subsequently underwent robotic assisted TAH/BSO with lymph node dissection.  Lymph nodes were negative.  Stage was 1A.  She had a high-grade serous endometrial carcinoma.  She received radiation treatment.  Chemotherapy was with Taxol and carboplatin.  She feels well and is doing well.  Continues to be followed by GYN oncology.  History of osteopenia and osteoarthritis.  History of hyperlipidemia.  Stress fracture right foot seen by Dr. Durward Fortes in March 2019.  Some mild peripheral neuropathy in feet that appears to be chemotherapy-induced.  B12 level has been checked previously and was rechecked October 7 and was normal at 774.  Was diagnosed with mitral valve prolapse in 2011 on echocardiogram.  She takes vitamin D supplement.  She took Fosamax for a couple of years for osteopenia.  Tetanus immunization is up-to-date until 2023.  Tonsillectomy at age 63, TMJ mandibular advancement surgery 1986.   Colonoscopy September 2006 and again in September 2016.  Social history: She completed college and worked in Scientist, research (medical) for a number of years.  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  She is a native of [place], [place] and is single.  Does not smoke or consume alcohol.  History: Mother with history of Alzheimer's dementia.  Father died at age 40 with kidney disease and had history of MI, hypertension, and CVA.  1 brother with hyperlipidemia.         Review of Systems only complaint is mild peripheral neuropathy in her feet which I feel is due to chemotherapy.     Objective:   Physical Exam Constitutional:      Appearance: Normal appearance.  HENT:     Head: Normocephalic and atraumatic.     Right Ear: Tympanic membrane normal.     Left Ear: Tympanic membrane normal.  Eyes:     General: No scleral icterus.       Left eye: No discharge.     Extraocular Movements: Extraocular movements intact.     Pupils: Pupils are equal, round, and reactive to light.  Neck:     Vascular: No carotid bruit.  Cardiovascular:     Rate and Rhythm: Normal rate and regular rhythm.     Heart sounds: Normal heart sounds. No murmur heard.      Comments: Breasts- deferred to GYN Pulmonary:     Effort: Pulmonary effort is normal. No respiratory distress.     Breath sounds: Normal breath sounds. No rales.  Abdominal:  General: Bowel sounds are normal. There is no distension.     Palpations: Abdomen is soft. There is no mass.     Tenderness: There is no guarding or rebound.  Genitourinary:    Comments: Deferred to GYN Musculoskeletal:     Cervical back: Neck supple. No rigidity.     Right lower leg: No edema.     Left lower leg: No edema.  Lymphadenopathy:     Cervical: No cervical adenopathy.  Skin:    General: Skin is warm and dry.  Neurological:     Mental Status: She is alert and oriented to person, place, and time.     Cranial Nerves: No cranial nerve  deficit.     Motor: No weakness.     Coordination: Coordination normal.     Comments: Pulses in feet normal  Psychiatric:        Mood and Affect: Mood normal.        Behavior: Behavior normal.        Thought Content: Thought content normal.        Judgment: Judgment normal.           Assessment & Plan:  Low BP readings in am- may try stopping Norvasc and monitoring BP off medication   Peripheral neuropathy- several years started after chemotherapy for endometrial cancer.  B12 level is normal.  DCIS- on Tamoxifen 10 mg daily. S/p lumpectomy December 2020  and 21  radiation treatments. Followed by Dr. Lindi Adie.  Hx Endometrial cancer- on Tamoxifen.  Followed by GYN and oncology  Health maintenance- flu vaccine given. Bone density due next year.  Vaginal discharge- pt to see GYN soon  Colonoscopy- not due until 2026  Plan: Try stopping Norvasc since blood pressure readings have been low.  May need to resume if blood pressure readings elevate.  Labs reviewed.  No evidence of B12 deficiency.  Return in 1 year or as needed.  Continue statin medication.  Subjective:   Patient presents for Medicare Annual/Subsequent preventive examination.  Review Past Medical/Family/Social: See above   Risk Factors  Current exercise habits: Tries to get regular exercise Dietary issues discussed: Low-fat low carbohydrate  Cardiac risk factors: Father with history of MI.  Patient has history of hyperlipidemia but LDL stable on statin medication Depression Screen  (Note: if answer to either of the following is "Yes", a more complete depression screening is indicated)   Over the past two weeks, have you felt down, depressed or hopeless? No  Over the past two weeks, have you felt little interest or pleasure in doing things? No Have you lost interest or pleasure in daily life? No Do you often feel hopeless? No Do you cry easily over simple problems? No   Activities of Daily Living  In your  present state of health, do you have any difficulty performing the following activities?:   Driving? No  Managing money? No  Feeding yourself? No  Getting from bed to chair? No  Climbing a flight of stairs? No  Preparing food and eating?: No  Bathing or showering? No  Getting dressed: No  Getting to the toilet? No  Using the toilet:No  Moving around from place to place: No  In the past year have you fallen or had a near fall?:No  Are you sexually active? No  Do you have more than one partner? No   Hearing Difficulties: No  Do you often ask people to speak up or repeat themselves? No  Do you experience  ringing or noises in your ears? No  Do you have difficulty understanding soft or whispered voices? No  Do you feel that you have a problem with memory? No Do you often misplace items? No    Home Safety:  Do you have a smoke alarm at your residence? Yes Do you have grab bars in the bathroom?  No Do you have throw rugs in your house?  No    Cognitive Testing  Alert? Yes Normal Appearance?Yes  Oriented to person? Yes Place? Yes  Time? Yes  Recall of three objects? Yes  Can perform simple calculations? Yes  Displays appropriate judgment?Yes  Can read the correct time from a watch face?Yes   List the Names of Other Physician/Practitioners you currently use:  See referral list for the physicians patient is currently seeing.  Dr. Lindi Adie  GYN   Review of Systems: See above   Objective:     General appearance: Appears younger than stated age and trim. Head: Normocephalic, without obvious abnormality, atraumatic  Eyes: conj clear, EOMi PEERLA  Ears: normal TM's and external ear canals both ears  Nose: Nares normal. Septum midline. Mucosa normal. No drainage or sinus tenderness.  Throat: lips, mucosa, and tongue normal; teeth and gums normal  Neck: no adenopathy, no carotid bruit, no JVD, supple, symmetrical, trachea midline and thyroid not enlarged, symmetric, no  tenderness/mass/nodules  No CVA tenderness.  Lungs: clear to auscultation bilaterally  Heart: regular rate and rhythm, S1, S2 normal, no murmur, click, rub or gallop  Abdomen: soft, non-tender; bowel sounds normal; no masses, no organomegaly  Musculoskeletal: ROM normal in all joints, no crepitus, no deformity, Normal muscle strengthen. Back  is symmetric, no curvature. Skin: Skin color, texture, turgor normal. No rashes or lesions  Lymph nodes: Cervical, supraclavicular, and axillary nodes normal.  Neurologic: CN 2 -12 Normal, Normal symmetric reflexes. Normal coordination and gait  Psych: Alert & Oriented x 3, Mood appear stable.    Assessment:    Annual wellness medicare exam   Plan:    During the course of the visit the patient was educated and counseled about appropriate screening and preventive services including:   Has had 2 COVID-19 vaccines.  Pneumococcal vaccines up-to-date.  Gets annual flu vaccine.     Patient Instructions (the written plan) was given to the patient.  Medicare Attestation  I have personally reviewed:  The patient's medical and social history  Their use of alcohol, tobacco or illicit drugs  Their current medications and supplements  The patient's functional ability including ADLs,fall risks, home safety risks, cognitive, and hearing and visual impairment  Diet and physical activities  Evidence for depression or mood disorders  The patient's weight, height, BMI, and visual acuity have been recorded in the chart. I have made referrals, counseling, and provided education to the patient based on review of the above and I have provided the patient with a written personalized care plan for preventive services.

## 2020-04-09 NOTE — Patient Instructions (Addendum)
Flu vaccine given. It was a pleasure to see you today.  B12 level checked due to peripheral neuropathy symptoms and is normal.  You may try stopping Norvasc and monitoring her blood pressure off the medication.  Continue statin medication.  Follow-up in 1 year or as needed.

## 2020-04-10 LAB — COMPLETE METABOLIC PANEL WITH GFR
AG Ratio: 1.8 (calc) (ref 1.0–2.5)
ALT: 14 U/L (ref 6–29)
AST: 19 U/L (ref 10–35)
Albumin: 4.4 g/dL (ref 3.6–5.1)
Alkaline phosphatase (APISO): 42 U/L (ref 37–153)
BUN: 11 mg/dL (ref 7–25)
CO2: 26 mmol/L (ref 20–32)
Calcium: 9.3 mg/dL (ref 8.6–10.4)
Chloride: 104 mmol/L (ref 98–110)
Creat: 0.77 mg/dL (ref 0.60–0.93)
GFR, Est African American: 90 mL/min/{1.73_m2} (ref >=60)
GFR, Est Non African American: 78 mL/min/{1.73_m2} (ref >=60)
Globulin: 2.4 g/dL (calc) (ref 1.9–3.7)
Glucose, Bld: 93 mg/dL (ref 65–99)
Potassium: 4.4 mmol/L (ref 3.5–5.3)
Sodium: 140 mmol/L (ref 135–146)
Total Bilirubin: 0.5 mg/dL (ref 0.2–1.2)
Total Protein: 6.8 g/dL (ref 6.1–8.1)

## 2020-04-10 LAB — TEST AUTHORIZATION

## 2020-04-10 LAB — VITAMIN B12: Vitamin B-12: 774 pg/mL (ref 200–1100)

## 2020-04-10 LAB — CBC WITH DIFFERENTIAL/PLATELET
Absolute Monocytes: 505 cells/uL (ref 200–950)
Basophils Absolute: 11 cells/uL (ref 0–200)
Basophils Relative: 0.3 %
Eosinophils Absolute: 80 cells/uL (ref 15–500)
Eosinophils Relative: 2.1 %
HCT: 39.4 % (ref 35.0–45.0)
Hemoglobin: 13.2 g/dL (ref 11.7–15.5)
Lymphs Abs: 954 cells/uL (ref 850–3900)
MCH: 31.5 pg (ref 27.0–33.0)
MCHC: 33.5 g/dL (ref 32.0–36.0)
MCV: 94 fL (ref 80.0–100.0)
MPV: 10.8 fL (ref 7.5–12.5)
Monocytes Relative: 13.3 %
Neutro Abs: 2250 cells/uL (ref 1500–7800)
Neutrophils Relative %: 59.2 %
Platelets: 196 10*3/uL (ref 140–400)
RBC: 4.19 10*6/uL (ref 3.80–5.10)
RDW: 12.8 % (ref 11.0–15.0)
Total Lymphocyte: 25.1 %
WBC: 3.8 10*3/uL (ref 3.8–10.8)

## 2020-04-10 LAB — LIPID PANEL
Cholesterol: 165 mg/dL (ref <200)
HDL: 61 mg/dL (ref >=50)
LDL Cholesterol (Calc): 88 mg/dL (calc)
Non-HDL Cholesterol (Calc): 104 mg/dL (calc) (ref <130)
Total CHOL/HDL Ratio: 2.7 (calc) (ref <5.0)
Triglycerides: 70 mg/dL (ref <150)

## 2020-04-10 LAB — TSH: TSH: 2.63 mIU/L (ref 0.40–4.50)

## 2020-04-23 DIAGNOSIS — H524 Presbyopia: Secondary | ICD-10-CM | POA: Diagnosis not present

## 2020-05-09 DIAGNOSIS — R928 Other abnormal and inconclusive findings on diagnostic imaging of breast: Secondary | ICD-10-CM | POA: Diagnosis not present

## 2020-05-09 LAB — HM MAMMOGRAPHY

## 2020-05-10 ENCOUNTER — Encounter: Payer: Self-pay | Admitting: Internal Medicine

## 2020-05-14 DIAGNOSIS — Z79810 Long term (current) use of selective estrogen receptor modulators (SERMs): Secondary | ICD-10-CM | POA: Diagnosis not present

## 2020-05-14 DIAGNOSIS — Z8542 Personal history of malignant neoplasm of other parts of uterus: Secondary | ICD-10-CM | POA: Diagnosis not present

## 2020-05-14 DIAGNOSIS — N898 Other specified noninflammatory disorders of vagina: Secondary | ICD-10-CM | POA: Diagnosis not present

## 2020-05-14 DIAGNOSIS — Z124 Encounter for screening for malignant neoplasm of cervix: Secondary | ICD-10-CM | POA: Diagnosis not present

## 2020-05-14 DIAGNOSIS — D0511 Intraductal carcinoma in situ of right breast: Secondary | ICD-10-CM | POA: Diagnosis not present

## 2020-05-27 ENCOUNTER — Other Ambulatory Visit: Payer: Self-pay | Admitting: Internal Medicine

## 2020-06-13 DIAGNOSIS — R69 Illness, unspecified: Secondary | ICD-10-CM | POA: Diagnosis not present

## 2020-07-03 ENCOUNTER — Encounter: Payer: Self-pay | Admitting: Hematology and Oncology

## 2020-07-04 ENCOUNTER — Other Ambulatory Visit: Payer: Self-pay | Admitting: Hematology and Oncology

## 2020-07-22 ENCOUNTER — Telehealth (INDEPENDENT_AMBULATORY_CARE_PROVIDER_SITE_OTHER): Payer: Medicare HMO | Admitting: Internal Medicine

## 2020-07-22 ENCOUNTER — Telehealth: Payer: Self-pay | Admitting: Internal Medicine

## 2020-07-22 ENCOUNTER — Encounter: Payer: Self-pay | Admitting: Internal Medicine

## 2020-07-22 VITALS — BP 148/91 | HR 112 | Temp 99.2°F

## 2020-07-22 DIAGNOSIS — R8290 Unspecified abnormal findings in urine: Secondary | ICD-10-CM | POA: Diagnosis not present

## 2020-07-22 DIAGNOSIS — R300 Dysuria: Secondary | ICD-10-CM

## 2020-07-22 DIAGNOSIS — N390 Urinary tract infection, site not specified: Secondary | ICD-10-CM | POA: Diagnosis not present

## 2020-07-22 LAB — POCT URINALYSIS DIPSTICK
Appearance: NEGATIVE
Bilirubin, UA: NEGATIVE
Glucose, UA: NEGATIVE
Ketones, UA: NEGATIVE
Nitrite, UA: NEGATIVE
Odor: NEGATIVE
Protein, UA: NEGATIVE
Spec Grav, UA: 1.01 (ref 1.010–1.025)
Urobilinogen, UA: 0.2 E.U./dL
pH, UA: 6 (ref 5.0–8.0)

## 2020-07-22 MED ORDER — CIPROFLOXACIN HCL 250 MG PO TABS: 250.0000 mg | ORAL_TABLET | Freq: Two times a day (BID) | ORAL | 0 refills | Status: DC

## 2020-07-22 NOTE — Telephone Encounter (Signed)
Called patient she is going to come and get cup and give [place] get urine sample and bring back and look for a home COVID test.

## 2020-07-22 NOTE — Progress Notes (Signed)
   Subjective:    Patient ID: Annette Tucker, female    DOB: 02-08-1946, 75 y.o.   MRN: [id number]  HPI 75 year old Female seen today via interactive audio and video telecommunications due to the coronavirus pandemic.  She is identified using 2 identifiers as Annette Tucker. Annette Tucker, a patient in this practice.  She is agreeable to visit in this format today.  Patient says she returned from a wedding in the [place] recently on January 19.  Subsequently started to have frequent urination burning and dark urine.  Noticed slight odor to urine this past weekend.  Patient also stated she had a runny nose during the entire trip to the [place] but her Covid test was negative recently.  Her last Covid test was 07/16/2020 before returning home.  I have suggested that she have another Covid test here at testing center.  She feels that the sinus issues are longstanding and not acute.  She takes Claritin.  Since she is traveled out of the country, I think it would be wise to retest.  Main issue today is dysuria and full sensation over her bladder area.  No blood in urine.  No fever or chills.  History of right ductal carcinoma in situ followed by oncologist.  Underwent right breast lumpectomy in December 2020.  History of benign essential hypertension.  She tried going off antihypertensive medication but is now back on amlodipine 2.5 mg daily.  Blood pressure vacillated when she was off of it.  She is on simvastatin for hyperlipidemia.  Remote history of endometrial carcinoma status post total robotic hysterectomy BSO and lymph node dissection December 2015.  Lymph nodes were negative for carcinoma.    Review of Systems see above-no nausea or vomiting.  No dysgeusia.  No sore throat.  No chills.     Objective:   Physical Exam Patient reports blood pressure is 148/91.  Pulse is 112.  Temperature 99.2 degrees  Seen virtually in no acute distress.  Able to give a clear concise history as  above       Assessment & Plan:  Acute urinary tract infection.  Urine culture will be sent.  Patient is to start on Cipro 250 mg twice daily for 7 days.  She will need follow-up here in 10 days to 2 weeks office visit and dipstick urine specimen.  Possible Covid exposure while traveling in [place] with complaint of sinus issues/postnasal drip.  Patient is to have Covid test performed locally and notify me of results.  Suggest quarantining until test results are back.

## 2020-07-22 NOTE — Patient Instructions (Addendum)
Urine culture is pending.  Take Cipro 250 mg twice daily for 7 days with follow-up here in 7 to 10 days.  Had Covid test at local testing Center.

## 2020-07-22 NOTE — Telephone Encounter (Signed)
Annette Tucker [phone]  Annette Tucker called to say she has just returned on Wednesday 07/17/20 from being out of the country. She started having frequent urination, burning, color is dark yellow, slight order this weekend, and she also said she has a runny nose that she had the whole time she was gone but her COVID test was negative, last COVID test was 07/16/2020 before returning home.

## 2020-07-22 NOTE — Telephone Encounter (Signed)
Patient can and brought Urine specimen in and has scheduled Virtual visit and she is going to try and get home COVID test.

## 2020-07-22 NOTE — Telephone Encounter (Signed)
She needs to take another Covid test. We can put a urine cup and wipes outside in mailbox to be picked up as soon as possible. She will need to bring the specimen back to the door. Then we can do virtual visit.

## 2020-07-23 LAB — URINALYSIS, MICROSCOPIC ONLY
Bacteria, UA: NONE SEEN /HPF
Hyaline Cast: NONE SEEN /LPF
RBC / HPF: NONE SEEN /HPF (ref 0–2)

## 2020-07-23 LAB — URINE CULTURE
MICRO NUMBER:: 11448651
SPECIMEN QUALITY:: ADEQUATE

## 2020-08-02 ENCOUNTER — Encounter: Payer: Self-pay | Admitting: Internal Medicine

## 2020-08-02 ENCOUNTER — Other Ambulatory Visit: Payer: Self-pay

## 2020-08-02 ENCOUNTER — Ambulatory Visit (INDEPENDENT_AMBULATORY_CARE_PROVIDER_SITE_OTHER): Payer: Medicare HMO | Admitting: Internal Medicine

## 2020-08-02 VITALS — BP 130/80 | HR 97 | Temp 98.0°F | Ht 62.0 in | Wt 120.0 lb

## 2020-08-02 DIAGNOSIS — N390 Urinary tract infection, site not specified: Secondary | ICD-10-CM

## 2020-08-02 LAB — POCT URINALYSIS DIPSTICK
Appearance: NEGATIVE
Bilirubin, UA: NEGATIVE
Blood, UA: NEGATIVE
Glucose, UA: NEGATIVE
Ketones, UA: NEGATIVE
Leukocytes, UA: NEGATIVE
Nitrite, UA: NEGATIVE
Odor: NEGATIVE
Protein, UA: NEGATIVE
Spec Grav, UA: 1.01 (ref 1.010–1.025)
Urobilinogen, UA: 0.2 E.U./dL
pH, UA: 7 (ref 5.0–8.0)

## 2020-08-02 NOTE — Progress Notes (Signed)
   Subjective:    Patient ID: Annette Tucker, female    DOB: [birth year], 75 y.o.   MRN: [id number]  HPI 75 year old Female seen for follow up on UTI.  Patient was seen by virtual visit on January 24 as she had traveled recently to the [place] on January 19.  Has had frequent urination, dysuria and dark urine.  Had noticed odor to urine.  She brought a urine specimen to the front door.  It was tested.  It was cloudy and had moderate LE.  Urine microscopic had 10-20 white blood cells per high-powered field.  Culture grew mixed flora.  She was diagnosed with acute urinary tract infection and was placed on Cipro 250 mg twice daily for 7 days.  She is seen today in person in no acute distress and is afebrile.  Has no urinary symptoms.  Feels well.    Review of Systems see above     Objective:   Physical Exam  Blood pressure 130/80 pulse 97, pulse oximetry 98%, temperature 98 degrees.  Dipstick UA results: Urine is clear without glucose bilirubin or ketones.  No blood on urine dipstick.  No protein in urine.  No nitrite.  No LE.      Assessment & Plan:  UTI resolved-although culture grew mixed flora, she had symptoms consistent with acute UTI.  Cipro seems to have helped her.  She feels better and is currently asymptomatic.  Return as needed.

## 2020-08-02 NOTE — Patient Instructions (Signed)
Urine dipstick is within normal limits status post treatment with Cipro for UTI.

## 2020-08-22 DIAGNOSIS — D0511 Intraductal carcinoma in situ of right breast: Secondary | ICD-10-CM | POA: Diagnosis not present

## 2020-09-17 DIAGNOSIS — D2371 Other benign neoplasm of skin of right lower limb, including hip: Secondary | ICD-10-CM | POA: Diagnosis not present

## 2020-09-17 DIAGNOSIS — D225 Melanocytic nevi of trunk: Secondary | ICD-10-CM | POA: Diagnosis not present

## 2020-09-17 DIAGNOSIS — L820 Inflamed seborrheic keratosis: Secondary | ICD-10-CM | POA: Diagnosis not present

## 2020-09-17 DIAGNOSIS — L821 Other seborrheic keratosis: Secondary | ICD-10-CM | POA: Diagnosis not present

## 2020-09-17 DIAGNOSIS — D2271 Melanocytic nevi of right lower limb, including hip: Secondary | ICD-10-CM | POA: Diagnosis not present

## 2020-09-17 DIAGNOSIS — L814 Other melanin hyperpigmentation: Secondary | ICD-10-CM | POA: Diagnosis not present

## 2020-09-17 DIAGNOSIS — L578 Other skin changes due to chronic exposure to nonionizing radiation: Secondary | ICD-10-CM | POA: Diagnosis not present

## 2020-09-17 DIAGNOSIS — D485 Neoplasm of uncertain behavior of skin: Secondary | ICD-10-CM | POA: Diagnosis not present

## 2020-11-26 ENCOUNTER — Telehealth: Payer: Self-pay | Admitting: Hematology and Oncology

## 2020-11-26 NOTE — Telephone Encounter (Signed)
R/s per prov PAL, per 7/21 los

## 2021-01-14 NOTE — Progress Notes (Signed)
Patient Care Team: [name], MD as PCP - General (Internal Medicine) Rolm Bookbinder, MD as Consulting Physician (General Surgery) Nicholas Lose, MD as Consulting Physician (Hematology and Oncology) Gery Pray, MD as Consulting Physician (Radiation Oncology)  DIAGNOSIS:    ICD-10-CM   1. Ductal carcinoma in situ (DCIS) of right breast  D05.11       SUMMARY OF ONCOLOGIC HISTORY: Oncology History  Endometrial ca [facility])  05/17/2014 Initial Diagnosis   Endometrial ca    06/05/2014 Surgery   TRH/BSO and staging. IAUPSC 0/12 nodes, no LVSI    07/12/2014 -  Chemotherapy   paclitaxel and carboplatin    08/21/2014 - 09/18/2014 Radiation Therapy      Uterine cancer ([place]) (Resolved)  05/17/2014 Initial Diagnosis   Uterine cancer     - 11/01/2014 Chemotherapy   Completed 6 cycles of paclitaxel and carboplatin based chemotherapy    06/05/2014 Surgery   IA UPSC     Radiation Therapy      Ductal carcinoma in situ (DCIS) of right breast  05/10/2019 Initial Diagnosis   Routine screening mammogram detected a 0.7cm group of calcifications in the upper outer right breast, a 0.5cm asymmetry in the outer right breast, and no axillary adenopathy. Biopsy DCIS with calcifications, intermediate to high grade, ER+ 90%, PR- 0%.   05/24/2019 Genetic Testing   No pathogenic variants identified. VUS in MSH2 called c.5C>T identified on the [facility] STAT Breast Cancer + Common Hereditary Cancers Panel. The report date is 05/23/2019.  The STAT Breast cancer panel offered by [facility] includes sequencing and rearrangement analysis for the following 9 genes:  ATM, BRCA1, BRCA2, CDH1, CHEK2, PALB2, PTEN, STK11 and TP53.    The Common Hereditary Cancers Panel offered by [facility] includes sequencing and/or deletion duplication testing of the following 48 genes: APC, ATM, AXIN2, BARD1, BMPR1A, BRCA1, BRCA2, BRIP1, CDH1, CDKN2A (p14ARF), CDKN2A (p16INK4a), CKD4, CHEK2, CTNNA1, DICER1, EPCAM  (Deletion/duplication testing only), GREM1 (promoter region deletion/duplication testing only), KIT, MEN1, MLH1, MSH2, MSH3, MSH6, MUTYH, NBN, NF1, NHTL1, PALB2, PDGFRA, PMS2, POLD1, POLE, PTEN, RAD50, RAD51C, RAD51D, RNF43, SDHB, SDHC, SDHD, SMAD4, SMARCA4. STK11, TP53, TSC1, TSC2, and VHL.  The following genes were evaluated for sequence changes only: SDHA and HOXB13 c.251G>A variant only.    05/30/2019 Surgery   Right lumpectomy Donne Hazel) [phone]): intermediate grade DCIS, 0.9cm, clear margins. ER+, PR-. No regional lymph nodes were examined.   05/30/2019 Cancer Staging   Staging form: Breast, AJCC 8th Edition - Pathologic stage from 05/30/2019: Stage 0 (pTis (DCIS), pN0, cM0, ER+, PR-)    06/26/2019 - 07/25/2019 Radiation Therapy   The patient initially received a dose of 40.05 Gy in 15 fractions to the breast using whole-breast tangent fields. This was delivered using a 3-D conformal technique. The pt received a boost delivering an additional 12 Gy in 6 fractions using a electron boost with 80mV electrons. The total dose was 52.05 Gy.   07/2019 - 07/2024 Anti-estrogen oral therapy   Tamoxifen daily     CHIEF COMPLIANT: Follow-up of right breast DCIS on tamoxifen  INTERVAL HISTORY: Annette Downieis a 75y.o. with above-mentioned history of right breast DCIS who underwent a lumpectomy, radiation, and is currently on antiestrogen therapy with tamoxifen. Mammogram on 05/09/20 showed no evidence for malignancy bilaterally. She presents to the clinic today for follow-up.  She has occasional hot flashes but otherwise tolerating tamoxifen extremely well.  ALLERGIES:  is allergic to latex and adhesive [tape].  MEDICATIONS:  Current Outpatient Medications  Medication Sig Dispense Refill   amLODipine (NORVASC) 2.5 MG tablet TAKE ONE TABLET BY MOUTH DAILY 90 tablet 2   ascorbic acid (VITAMIN C) 500 MG tablet Take 500 mg by mouth daily.     Calcium Carbonate-Vitamin D 600-400 MG-UNIT  tablet Take 1 tablet by mouth 2 (two) times daily. '@lunch'  and in the evening     cholecalciferol (VITAMIN D) 1000 UNITS tablet Take 1,000 Units by mouth 2 (two) times daily.      ciprofloxacin (CIPRO) 250 MG tablet Take 1 tablet (250 mg total) by mouth 2 (two) times daily. 14 tablet 0   Coenzyme Q10 (CO Q-10) 100 MG CAPS Take 1 capsule by mouth every morning.      fish oil-omega-3 fatty acids 1000 MG capsule Take 1 g by mouth every morning.     FLAXSEED, LINSEED, PO Take 1,000 mg by mouth daily.     Magnesium 250 MG TABS Take 1 tablet by mouth every evening.     Multiple Vitamins-Minerals (MULTIVITAMIN WITH MINERALS) tablet Take 1 tablet by mouth every morning.     simvastatin (ZOCOR) 20 MG tablet TAKE ONE TABLET BY MOUTH AT BEDTIME 90 tablet 3   tamoxifen (NOLVADEX) 10 MG tablet TAKE ONE TABLET BY MOUTH DAILY 90 tablet 3   vitamin E 400 UNIT capsule Take 400 Units by mouth every morning.     zinc gluconate 50 MG tablet Take 50 mg by mouth daily.     No current facility-administered medications for this visit.    PHYSICAL EXAMINATION: ECOG PERFORMANCE STATUS: 1 - Symptomatic but completely ambulatory  Vitals:   01/15/21 1123  BP: 134/82  Pulse: 98  Resp: 20  SpO2: 99%   Filed Weights   01/15/21 1123  Weight: 124 lb 4 oz (56.4 kg)    BREAST: No palpable masses or nodules in either right or left breasts. No palpable axillary supraclavicular or infraclavicular adenopathy no breast tenderness or nipple discharge. (exam performed in the presence of a chaperone)  LABORATORY DATA:  I have reviewed the data as listed CMP Latest Ref Rng & Units 04/04/2020 05/17/2019 04/04/2019  Glucose 65 - 99 mg/dL 93 87 95  BUN 7 - 25 mg/dL '11 16 15  ' Creatinine 0.60 - 0.93 mg/dL 0.77 0.83 0.75  Sodium 135 - 146 mmol/L 140 141 140  Potassium 3.5 - 5.3 mmol/L 4.4 4.1 4.5  Chloride 98 - 110 mmol/L 104 103 102  CO2 20 - 32 mmol/L '26 26 28  ' Calcium 8.6 - 10.4 mg/dL 9.3 9.6 9.8  Total Protein 6.1 - 8.1  g/dL 6.8 7.7 7.3  Total Bilirubin 0.2 - 1.2 mg/dL 0.5 0.4 0.5  Alkaline Phos 38 - 126 U/L - 76 -  AST 10 - 35 U/L '19 18 20  ' ALT 6 - 29 U/L '14 17 15    ' Lab Results  Component Value Date   WBC 3.8 04/04/2020   HGB 13.2 04/04/2020   HCT 39.4 04/04/2020   MCV 94.0 04/04/2020   PLT 196 04/04/2020   NEUTROABS 2,250 04/04/2020    ASSESSMENT & PLAN:  Ductal carcinoma in situ (DCIS) of right breast 05/10/2019:Routine screening mammogram detected a 0.7cm group of calcifications in the upper outer right breast, a 0.5cm asymmetry in the outer right breast, and no axillary adenopathy. Biopsy DCIS with calcifications, intermediate to high grade, ER+ 90%, PR- 0%.   Recommendation: 1. Breast conserving surgery 05/30/2019:  Rt Lumpectomy: DCIS IG 0.9 cm margins neg, ER 90%, PR 0% 2. Followed  by adjuvant radiation therapy 06/27/2019-07/21/2019 3. Followed by antiestrogen therapy with tamoxifen 5 years to start 07/31/2019 Genetics consult Neg  ------------------------------------------------------------------------------------------------------------------------------------- Tamoxifen toxicities: Occasional hot flashes Patient is tolerating 10 mg of tamoxifen extremely well. She wants to stay on this dosage.   Breast Cancer Surveillance: 1. Breast Exam: 01/15/2021: Benign 2. Mammograms: 05/09/2020: [facility]: Benign breast density category B   RTC in 1 year    No orders of the defined types were placed in this encounter.  The patient has a good understanding of the overall plan. she agrees with it. she will call with any problems that may develop before the next visit here.  Total time spent: 20 mins including face to face time and time spent for planning, charting and coordination of care  Rulon Eisenmenger, MD, MPH 01/15/2021  I, Thana Ates, am acting as scribe for Dr. Nicholas Lose.  I have reviewed the above documentation for accuracy and completeness, and I agree with the  above.

## 2021-01-15 ENCOUNTER — Inpatient Hospital Stay: Payer: Medicare HMO | Attending: Hematology and Oncology | Admitting: Hematology and Oncology

## 2021-01-15 ENCOUNTER — Other Ambulatory Visit: Payer: Self-pay

## 2021-01-15 DIAGNOSIS — Z79810 Long term (current) use of selective estrogen receptor modulators (SERMs): Secondary | ICD-10-CM | POA: Diagnosis not present

## 2021-01-15 DIAGNOSIS — Z79899 Other long term (current) drug therapy: Secondary | ICD-10-CM | POA: Diagnosis not present

## 2021-01-15 DIAGNOSIS — D0511 Intraductal carcinoma in situ of right breast: Secondary | ICD-10-CM | POA: Insufficient documentation

## 2021-01-15 NOTE — Assessment & Plan Note (Signed)
05/10/2019:Routine screening mammogram detected a 0.7cm group of calcifications in the upper outer right breast, a 0.5cm asymmetry in the outer right breast, and no axillary adenopathy. Biopsy DCIS with calcifications, intermediate to high grade, ER+ 90%, PR- 0%.  Recommendation: 1. Breast conserving surgery12/06/2018: Rt Lumpectomy: DCIS IG 0.9 cm margins neg, ER 90%, PR 0% 2. Followed by adjuvant radiation therapy12/29/2020-07/21/2019 3. Followed by antiestrogen therapy withtamoxifen5 yearsto start 07/31/2019 Geneticsconsult Neg ------------------------------------------------------------------------------------------------------------------------------------- Tamoxifen toxicities: Occasional hot flashes Patient is tolerating 10 mg of tamoxifen extremely well. She wants to stay on this dosage.  Breast Cancer Surveillance: 1. Breast Exam: 01/15/2021: Benign 2. Mammograms: 05/09/2020: [facility]: Benign breast density category B  RTC in 1 year

## 2021-01-22 ENCOUNTER — Ambulatory Visit: Payer: Medicare HMO | Admitting: Hematology and Oncology

## 2021-01-30 ENCOUNTER — Other Ambulatory Visit: Payer: Self-pay | Admitting: Internal Medicine

## 2021-04-08 ENCOUNTER — Other Ambulatory Visit: Payer: Medicare HMO | Admitting: Internal Medicine

## 2021-04-08 ENCOUNTER — Other Ambulatory Visit: Payer: Self-pay

## 2021-04-08 DIAGNOSIS — E7849 Other hyperlipidemia: Secondary | ICD-10-CM

## 2021-04-08 DIAGNOSIS — Z0000 Encounter for general adult medical examination without abnormal findings: Secondary | ICD-10-CM

## 2021-04-08 DIAGNOSIS — Z1329 Encounter for screening for other suspected endocrine disorder: Secondary | ICD-10-CM

## 2021-04-08 DIAGNOSIS — I10 Essential (primary) hypertension: Secondary | ICD-10-CM | POA: Diagnosis not present

## 2021-04-09 LAB — CBC WITH DIFFERENTIAL/PLATELET
Absolute Monocytes: 538 cells/uL (ref 200–950)
Basophils Absolute: 32 cells/uL (ref 0–200)
Basophils Relative: 0.7 %
Eosinophils Absolute: 92 cells/uL (ref 15–500)
Eosinophils Relative: 2 %
HCT: 40.5 % (ref 35.0–45.0)
Hemoglobin: 13.8 g/dL (ref 11.7–15.5)
Lymphs Abs: 989 cells/uL (ref 850–3900)
MCH: 31.3 pg (ref 27.0–33.0)
MCHC: 34.1 g/dL (ref 32.0–36.0)
MCV: 91.8 fL (ref 80.0–100.0)
MPV: 11 fL (ref 7.5–12.5)
Monocytes Relative: 11.7 %
Neutro Abs: 2949 cells/uL (ref 1500–7800)
Neutrophils Relative %: 64.1 %
Platelets: 215 10*3/uL (ref 140–400)
RBC: 4.41 10*6/uL (ref 3.80–5.10)
RDW: 12.2 % (ref 11.0–15.0)
Total Lymphocyte: 21.5 %
WBC: 4.6 10*3/uL (ref 3.8–10.8)

## 2021-04-09 LAB — COMPLETE METABOLIC PANEL WITH GFR
AG Ratio: 1.8 (calc) (ref 1.0–2.5)
ALT: 17 U/L (ref 6–29)
AST: 20 U/L (ref 10–35)
Albumin: 4.5 g/dL (ref 3.6–5.1)
Alkaline phosphatase (APISO): 42 U/L (ref 37–153)
BUN: 15 mg/dL (ref 7–25)
CO2: 29 mmol/L (ref 20–32)
Calcium: 9.4 mg/dL (ref 8.6–10.4)
Chloride: 102 mmol/L (ref 98–110)
Creat: 0.77 mg/dL (ref 0.60–1.00)
Globulin: 2.5 g/dL (calc) (ref 1.9–3.7)
Glucose, Bld: 95 mg/dL (ref 65–99)
Potassium: 4.3 mmol/L (ref 3.5–5.3)
Sodium: 139 mmol/L (ref 135–146)
Total Bilirubin: 0.5 mg/dL (ref 0.2–1.2)
Total Protein: 7 g/dL (ref 6.1–8.1)
eGFR: 82 mL/min/{1.73_m2} (ref >=60)

## 2021-04-09 LAB — LIPID PANEL
Cholesterol: 177 mg/dL (ref <200)
HDL: 71 mg/dL (ref >=50)
LDL Cholesterol (Calc): 89 mg/dL (calc)
Non-HDL Cholesterol (Calc): 106 mg/dL (calc) (ref <130)
Total CHOL/HDL Ratio: 2.5 (calc) (ref <5.0)
Triglycerides: 81 mg/dL (ref <150)

## 2021-04-09 LAB — TSH: TSH: 4.05 mIU/L (ref 0.40–4.50)

## 2021-04-10 ENCOUNTER — Ambulatory Visit (INDEPENDENT_AMBULATORY_CARE_PROVIDER_SITE_OTHER): Payer: Medicare HMO | Admitting: Internal Medicine

## 2021-04-10 ENCOUNTER — Other Ambulatory Visit: Payer: Self-pay

## 2021-04-10 ENCOUNTER — Encounter: Payer: Self-pay | Admitting: Internal Medicine

## 2021-04-10 VITALS — BP 128/92 | HR 92 | Temp 98.3°F | Ht 62.0 in | Wt 125.0 lb

## 2021-04-10 DIAGNOSIS — G620 Drug-induced polyneuropathy: Secondary | ICD-10-CM

## 2021-04-10 DIAGNOSIS — I10 Essential (primary) hypertension: Secondary | ICD-10-CM | POA: Diagnosis not present

## 2021-04-10 DIAGNOSIS — T451X5A Adverse effect of antineoplastic and immunosuppressive drugs, initial encounter: Secondary | ICD-10-CM

## 2021-04-10 DIAGNOSIS — Z0000 Encounter for general adult medical examination without abnormal findings: Secondary | ICD-10-CM | POA: Diagnosis not present

## 2021-04-10 DIAGNOSIS — D0511 Intraductal carcinoma in situ of right breast: Secondary | ICD-10-CM

## 2021-04-10 DIAGNOSIS — Z823 Family history of stroke: Secondary | ICD-10-CM

## 2021-04-10 DIAGNOSIS — Z8542 Personal history of malignant neoplasm of other parts of uterus: Secondary | ICD-10-CM

## 2021-04-10 LAB — POCT URINALYSIS DIPSTICK
Bilirubin, UA: NEGATIVE
Blood, UA: NEGATIVE
Glucose, UA: NEGATIVE
Ketones, UA: NEGATIVE
Leukocytes, UA: NEGATIVE
Nitrite, UA: NEGATIVE
Protein, UA: NEGATIVE
Spec Grav, UA: 1.01 (ref 1.010–1.025)
Urobilinogen, UA: 0.2 E.U./dL
pH, UA: 5 (ref 5.0–8.0)

## 2021-04-10 MED ORDER — HYDROCORTISONE (PERIANAL) 2.5 % EX CREA: 1.0000 "application " | TOPICAL_CREAM | Freq: Two times a day (BID) | CUTANEOUS | 0 refills | Status: DC

## 2021-04-10 NOTE — Progress Notes (Signed)
IElby Showers, MD, have reviewed all documentation for this visit. The documentation on 04/26/21 for the exam, diagnosis, procedures, and orders are all accurate and compl  Subjective:   Patient presents for Medicare Annual/Subsequent preventive examination.  Risk Factors  Current exercise habits:did treadmill until had stress fracture. Active daily Dietary issues discussed:  low fat low carb  Cardiac risk factors:advanced age (older than 66 for men, 33 for women), dyslipidemia, and hypertension  Depression Screen  (Note: if answer to either of the following is "Yes", a more complete depression screening is indicated)   Over the past two weeks, have you felt down, depressed or hopeless? No Over the past two weeks, have you felt little interest or pleasure in doing things? No Have you lost interest or pleasure in daily life? No Do you often feel hopeless? No Do you cry easily over simple problems? No  Activities of Daily Living  In your present state of health, do you have any difficulty performing the following activities?:   Driving? No Managing money? No Feeding yourself? No Getting from bed to chair?No Climbing a flight of stairs?  No Preparing food and eating?:  No Bathing or showering?   No Getting dressed:  No Getting to the toilet?  No Using the toilet:  No Moving around from place to place:  No In the past year have you fallen or had a near fall?  No Are you sexually active?  No Do you have more than one partner?  No  Hearing Difficulties:    Do you often ask people to speak up or repeat themselves?  No Do you experience ringing or noises in your ears?   No Do you have difficulty understanding soft or whispered voices?  No Do you feel that you have a problem with memory?  No  Do you often misplace items?  No   Home Safety:  Do you have a smoke alarm at your residence? Yes Do you have grab bars in the bathroom?  No Do you have throw rugs in your house?    No   Cognitive Testing  Alert? Yes Normal Appearance?Yes  Oriented to person? Yes Place? Yes  Time? Yes  Recall of three objects? Yes  Can perform simple calculations? Yes  Displays appropriate judgment?Yes  Can read the correct time from a watch face?Yes   List the Names of Other Physician/Practitioners you currently use:  See referral list for the physicians patient is currently seeing.  GYN-Fogelman  Eye physician  Delman Cheadle - Dermatologist  Oncology-[place]   Donne Hazel- Gen surgery  Patient Instructions (the written plan) was given to the patient.  Medicare Attestation  I have personally reviewed:  The patient's medical and social history  Their use of alcohol, tobacco or illicit drugs  Their current medications and supplements  The patient's functional ability including ADLs,fall risks, home safety risks, cognitive, and hearing and visual impairment  Diet and physical activities  Evidence for depression or mood disorders  The patient's weight, height, BMI, and visual acuity have been recorded in the chart. I have made referrals, counseling, and provided education to the patient based on review of the above and I have provided the patient with a written personalized care plan for preventive services.     75 year old Female seen for health maintenance exam and evaluation of medical issues.  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  Biopsy showed DCIS with calcifications intermediate to high-grade.  Tumor was  ER positive PR negative.  She had genetic testing with no pathogenic variants identified.  She is being followed by Dr. Quintella Reichert.  She had radiation therapy.  She had right lumpectomy May 30, 2019 by Dr. Donne Hazel.  Will be on anastrozole for total of 5 years.  History of hypertension well-controlled on low-dose amlodipine.  History of hyperlipidemia treated with Zocor 20 mg daily.  History of small retinal hemorrhage followed by Dr. Marica Otter.  In 2015 she noted vaginal spotting.  She had D&C and it revealed endometrial cancer.  She was referred to GYN oncologist.  Subsequently underwent robotic assisted TAH/BSO with lymph node dissection.  Lymph nodes were negative.  Stage was 180.  She had a high-grade serous endometrial carcinoma.  She received radiation treatment.  Had chemotherapy with Taxol and carboplatin.  Continues to be followed by GYN oncology.  She feels well and is doing well.  History of osteopenia and osteoarthritis.  Stress fracture right foot seen by Dr. Durward Fortes in March 2019.  Some mild peripheral neuropathy that appears to be chemotherapy induced.  B12 level has been checked previously.  Was diagnosed with mitral valve prolapse in 2011 on echocardiogram.  She takes vitamin D supplement.  She took Fosamax for a couple of years for osteopenia.  Had tonsillectomy at age 72, TMJ mandibular advancement surgery 1986.  Colonoscopy September 2006 and in 2016.  Family history: Mother with history of Alzheimer's dementia.  Father died at age 66 with kidney disease and had history of MI, hypertension and CVA.  1 brother with hyperlipidemia.  Social history: She completed college and worked in Scientist, research (medical) for a number of years.  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  She is a native of [place] and is single.  She does not smoke or consume alcohol.  Vital signs reviewed.  She is slightly anxious blood pressure is 128/92 but usually runs better than that at home.  Weight 125 pounds.  BMI 22.86 pulse 92 regular pulse oximetry 98%.  Skin: Warm and dry.  No cervical adenopathy.  No thyromegaly.  No carotid bruits.  Chest is clear to auscultation without rales or wheezing.  Cardiac exam: Regular rate and rhythm without ectopy or murmurs.  Abdomen is soft nondistended without hepatosplenomegaly masses or tenderness.  No lower extremity pitting edema.  GYN exam deferred to  gynecology as is breast exam.  Neurological exam is intact without focal deficits.  Affect thought and judgment are normal  Impression: History of peripheral neuropathy which started several years after chemotherapy for endometrial cancer.  B12 level has been checked and has been normal.  DCIS-takes tamoxifen.  Had lumpectomy December 2020 and 21 radiation treatments.  Followed by Dr. Lindi Adie.  History of endometrial cancer followed by GYN and Oncology  Health maintenance-colonoscopy not due until 2026.  Had COVID booster in July.  Has had pneumococcal vaccines.  Tetanus update due next year.  Gets annual flu vaccine.  Mild hypertension treated with Norvasc.  Family history of MI in father who also had CVA and hypertension.  She takes Zocor 20 mg daily.  Her LDL was 107 in 2020 and is normal this year.  Plan: Continue current medications and follow-up in 1 year or as needed.  She will continue with Lipitor and Norvasc.  She will continue close follow-up with GYN and with Dr. Lindi Adie in Oncology.  Prescribed Anusol cream HC if needed for hemorrhoids as requested by patient.

## 2021-04-22 ENCOUNTER — Other Ambulatory Visit: Payer: Self-pay

## 2021-04-22 DIAGNOSIS — Z8542 Personal history of malignant neoplasm of other parts of uterus: Secondary | ICD-10-CM

## 2021-04-22 DIAGNOSIS — M8580 Other specified disorders of bone density and structure, unspecified site: Secondary | ICD-10-CM

## 2021-04-22 DIAGNOSIS — N951 Menopausal and female climacteric states: Secondary | ICD-10-CM

## 2021-04-24 DIAGNOSIS — E119 Type 2 diabetes mellitus without complications: Secondary | ICD-10-CM | POA: Diagnosis not present

## 2021-04-24 DIAGNOSIS — Z0100 Encounter for examination of eyes and vision without abnormal findings: Secondary | ICD-10-CM | POA: Diagnosis not present

## 2021-04-24 DIAGNOSIS — H5203 Hypermetropia, bilateral: Secondary | ICD-10-CM | POA: Diagnosis not present

## 2021-04-24 DIAGNOSIS — I10 Essential (primary) hypertension: Secondary | ICD-10-CM | POA: Diagnosis not present

## 2021-04-26 NOTE — Patient Instructions (Addendum)
It was a pleasure to see you today.  Blood pressure is stable on current regimen.  Advise continuing with statin medication with family history of stroke in her father.  Continue close follow-up with oncology and GYN.  Return in 1 year or as needed.  Lab work is excellent.  I have prescribed Anusol cream if needed as requested.

## 2021-04-29 ENCOUNTER — Other Ambulatory Visit: Payer: Self-pay | Admitting: Internal Medicine

## 2021-05-08 DIAGNOSIS — Z6822 Body mass index (BMI) 22.0-22.9, adult: Secondary | ICD-10-CM | POA: Diagnosis not present

## 2021-05-08 DIAGNOSIS — Z124 Encounter for screening for malignant neoplasm of cervix: Secondary | ICD-10-CM | POA: Diagnosis not present

## 2021-05-08 DIAGNOSIS — Z01411 Encounter for gynecological examination (general) (routine) with abnormal findings: Secondary | ICD-10-CM | POA: Diagnosis not present

## 2021-05-08 DIAGNOSIS — C541 Malignant neoplasm of endometrium: Secondary | ICD-10-CM | POA: Diagnosis not present

## 2021-05-08 DIAGNOSIS — N898 Other specified noninflammatory disorders of vagina: Secondary | ICD-10-CM | POA: Diagnosis not present

## 2021-05-08 DIAGNOSIS — Z01419 Encounter for gynecological examination (general) (routine) without abnormal findings: Secondary | ICD-10-CM | POA: Diagnosis not present

## 2021-05-12 ENCOUNTER — Encounter: Payer: Self-pay | Admitting: Internal Medicine

## 2021-05-12 DIAGNOSIS — R928 Other abnormal and inconclusive findings on diagnostic imaging of breast: Secondary | ICD-10-CM | POA: Diagnosis not present

## 2021-05-12 DIAGNOSIS — Z853 Personal history of malignant neoplasm of breast: Secondary | ICD-10-CM | POA: Diagnosis not present

## 2021-05-12 DIAGNOSIS — M8589 Other specified disorders of bone density and structure, multiple sites: Secondary | ICD-10-CM | POA: Diagnosis not present

## 2021-05-14 ENCOUNTER — Telehealth: Payer: Self-pay | Admitting: *Deleted

## 2021-05-14 ENCOUNTER — Telehealth: Payer: Self-pay

## 2021-05-14 NOTE — Telephone Encounter (Signed)
Attempted to call pt to give bone density results per MD. Pt's bone density was -1.8  2 years ago and is now -1.7. Pt should be advised to take 1200 of calcium and 1000 of vit D and perform wt bearing exercises. LVM for pt to return call.

## 2021-05-14 NOTE — Telephone Encounter (Signed)
RN attempt x1 to contact pt regarding recent bone density report from [facility] showing T-Score -1.8.  No answer.  LVM for pt to return call to the office.

## 2021-06-04 ENCOUNTER — Telehealth: Payer: Self-pay | Admitting: *Deleted

## 2021-06-04 NOTE — Telephone Encounter (Signed)
Received call from Annette Tucker stating recent PAP showed abnormal granular cells. Annette Tucker with hx of endometrial cancer and complete hysterectomy. Annette Tucker currently experiencing vaginal discharge while on Tamoxifen and requesting advice from MD.  MD out of office, RN reviewed with NP and states Annette Tucker needing to continue to work with GYN.  Abnormal cells may be related to vaginal dryness while on Tamoxifen. Annette Tucker states she is currently closely following GYN and is scheduled for f/u PAP in 6 months.  Annette Tucker states she has requested GYN office Dr. Pamala Hurry will send recent office notes for MD to review.

## 2021-06-09 ENCOUNTER — Other Ambulatory Visit: Payer: Self-pay | Admitting: *Deleted

## 2021-06-09 MED ORDER — TAMOXIFEN CITRATE 10 MG PO TABS: 5.0000 mg | ORAL_TABLET | Freq: Every day | ORAL | 2 refills | Status: DC

## 2021-06-09 NOTE — Telephone Encounter (Signed)
Notified to reduce dose of Tamoxifen to 5 mg daily.Verbalized understanding

## 2021-07-25 ENCOUNTER — Other Ambulatory Visit: Payer: Self-pay | Admitting: Internal Medicine

## 2021-09-19 DIAGNOSIS — D0470 Carcinoma in situ of skin of unspecified lower limb, including hip: Secondary | ICD-10-CM | POA: Diagnosis not present

## 2021-09-19 DIAGNOSIS — M79672 Pain in left foot: Secondary | ICD-10-CM | POA: Diagnosis not present

## 2021-09-19 DIAGNOSIS — D2371 Other benign neoplasm of skin of right lower limb, including hip: Secondary | ICD-10-CM | POA: Diagnosis not present

## 2021-09-19 DIAGNOSIS — L814 Other melanin hyperpigmentation: Secondary | ICD-10-CM | POA: Diagnosis not present

## 2021-09-19 DIAGNOSIS — D485 Neoplasm of uncertain behavior of skin: Secondary | ICD-10-CM | POA: Diagnosis not present

## 2021-09-19 DIAGNOSIS — L821 Other seborrheic keratosis: Secondary | ICD-10-CM | POA: Diagnosis not present

## 2021-09-19 DIAGNOSIS — L578 Other skin changes due to chronic exposure to nonionizing radiation: Secondary | ICD-10-CM | POA: Diagnosis not present

## 2021-09-19 DIAGNOSIS — D2271 Melanocytic nevi of right lower limb, including hip: Secondary | ICD-10-CM | POA: Diagnosis not present

## 2021-09-19 DIAGNOSIS — D225 Melanocytic nevi of trunk: Secondary | ICD-10-CM | POA: Diagnosis not present

## 2021-10-23 DIAGNOSIS — D0471 Carcinoma in situ of skin of right lower limb, including hip: Secondary | ICD-10-CM | POA: Diagnosis not present

## 2021-11-19 ENCOUNTER — Ambulatory Visit: Payer: Medicare HMO | Attending: Internal Medicine

## 2021-11-19 ENCOUNTER — Ambulatory Visit: Payer: Medicare HMO

## 2021-11-19 DIAGNOSIS — N952 Postmenopausal atrophic vaginitis: Secondary | ICD-10-CM | POA: Diagnosis not present

## 2021-11-19 DIAGNOSIS — Z08 Encounter for follow-up examination after completed treatment for malignant neoplasm: Secondary | ICD-10-CM | POA: Diagnosis not present

## 2021-11-19 DIAGNOSIS — Z23 Encounter for immunization: Secondary | ICD-10-CM

## 2021-11-19 DIAGNOSIS — Z8542 Personal history of malignant neoplasm of other parts of uterus: Secondary | ICD-10-CM | POA: Diagnosis not present

## 2021-11-19 DIAGNOSIS — R87629 Unspecified abnormal cytological findings in specimens from vagina: Secondary | ICD-10-CM | POA: Diagnosis not present

## 2021-11-19 NOTE — Progress Notes (Signed)
   Covid-19 Vaccination Clinic  Name:  Danessa Mensch    MRN: [id number] DOB: 07-07-1945  11/19/2021  Ms. Dieter was observed post Covid-19 immunization for 15 minutes without incident. She was provided with Vaccine Information Sheet and instruction to access the V-Safe system.   Ms. Harrow was instructed to call 911 with any severe reactions post vaccine: Difficulty breathing  Swelling of face and throat  A fast heartbeat  A bad rash all over body  Dizziness and weakness   Immunizations Administered     Name Date Dose VIS Date Route   Moderna Covid-19 vaccine Bivalent Booster 11/19/2021 11:31 AM 0.5 mL 02/08/2021 Intramuscular   Manufacturer: Levan Hurst   Lot: [id number]   [place]: [place] Covid-19 Vaccine Bivalent Booster 11/19/2021 11:56 AM 0.3 mL 02/26/2021 Intramuscular   Manufacturer: [employer]   Lot: [id number]   [place]: [phone]

## 2021-11-21 ENCOUNTER — Other Ambulatory Visit (HOSPITAL_BASED_OUTPATIENT_CLINIC_OR_DEPARTMENT_OTHER): Payer: Self-pay

## 2021-11-21 MED ORDER — PFIZER COVID-19 VAC BIVALENT 30 MCG/0.3ML IM SUSP
INTRAMUSCULAR | 0 refills | Status: DC
  Filled 2021-11-21: qty 0.3, 1d supply, fill #0

## 2022-01-07 ENCOUNTER — Other Ambulatory Visit: Payer: Self-pay

## 2022-01-15 ENCOUNTER — Inpatient Hospital Stay: Payer: Medicare HMO | Attending: Hematology and Oncology | Admitting: Hematology and Oncology

## 2022-01-15 DIAGNOSIS — D0511 Intraductal carcinoma in situ of right breast: Secondary | ICD-10-CM | POA: Insufficient documentation

## 2022-01-15 DIAGNOSIS — Z79810 Long term (current) use of selective estrogen receptor modulators (SERMs): Secondary | ICD-10-CM | POA: Diagnosis not present

## 2022-01-15 NOTE — Assessment & Plan Note (Addendum)
05/10/2019:Routine screening mammogram detected a 0.7cm group of calcifications in the upper outer right breast, a 0.5cm asymmetry in the outer right breast, and no axillary adenopathy. Biopsy DCIS with calcifications, intermediate to high grade, ER+ 90%, PR- 0%.  Recommendation: 1. Breast conserving surgery12/06/2018: Rt Lumpectomy: DCIS IG 0.9 cm margins neg, ER 90%, PR 0% 2. Followed by adjuvant radiation therapy12/29/2020-07/21/2019 3. Followed by antiestrogen therapy withtamoxifen5 yearsto start 07/31/2019 GeneticsconsultNeg ------------------------------------------------------------------------------------------------------------------------------------- Tamoxifen toxicities: Occasional hot flashes Vaginal discharge: Resolved after we reduce the dosage to 5 mg daily.  Breast Cancer Surveillance: 1. Breast Exam: 01/15/2022: Benign 2. Mammograms:  05/02/2021: [facility]: Benign breast density category B Bone density 05/30/2021: T score -1.8: Osteopenia  RTC in 1 year

## 2022-01-15 NOTE — Progress Notes (Signed)
Patient Care Team: [name], MD as PCP - General (Internal Medicine) Rolm Bookbinder, MD as Consulting Physician (General Surgery) Nicholas Lose, MD as Consulting Physician (Hematology and Oncology) Gery Pray, MD as Consulting Physician (Radiation Oncology)  DIAGNOSIS:  Encounter Diagnosis  Name Primary?   Ductal carcinoma in situ (DCIS) of right breast     SUMMARY OF ONCOLOGIC HISTORY: Oncology History  Endometrial ca [facility])  05/17/2014 Initial Diagnosis   Endometrial ca   06/05/2014 Surgery   TRH/BSO and staging. IAUPSC 0/12 nodes, no LVSI   07/12/2014 -  Chemotherapy   paclitaxel and carboplatin   08/21/2014 - 09/18/2014 Radiation Therapy     Uterine cancer ([place]) (Resolved)  05/17/2014 Initial Diagnosis   Uterine cancer    - 11/01/2014 Chemotherapy   Completed 6 cycles of paclitaxel and carboplatin based chemotherapy   06/05/2014 Surgery   IA UPSC    Radiation Therapy     Ductal carcinoma in situ (DCIS) of right breast  05/10/2019 Initial Diagnosis   Routine screening mammogram detected a 0.7cm group of calcifications in the upper outer right breast, a 0.5cm asymmetry in the outer right breast, and no axillary adenopathy. Biopsy DCIS with calcifications, intermediate to high grade, ER+ 90%, PR- 0%.   05/24/2019 Genetic Testing   No pathogenic variants identified. VUS in MSH2 called c.5C>T identified on the [facility] STAT Breast Cancer + Common Hereditary Cancers Panel. The report date is 05/23/2019.  The STAT Breast cancer panel offered by [facility] includes sequencing and rearrangement analysis for the following 9 genes:  ATM, BRCA1, BRCA2, CDH1, CHEK2, PALB2, PTEN, STK11 and TP53.    The Common Hereditary Cancers Panel offered by [facility] includes sequencing and/or deletion duplication testing of the following 48 genes: APC, ATM, AXIN2, BARD1, BMPR1A, BRCA1, BRCA2, BRIP1, CDH1, CDKN2A (p14ARF), CDKN2A (p16INK4a), CKD4, CHEK2, CTNNA1, DICER1, EPCAM  (Deletion/duplication testing only), GREM1 (promoter region deletion/duplication testing only), KIT, MEN1, MLH1, MSH2, MSH3, MSH6, MUTYH, NBN, NF1, NHTL1, PALB2, PDGFRA, PMS2, POLD1, POLE, PTEN, RAD50, RAD51C, RAD51D, RNF43, SDHB, SDHC, SDHD, SMAD4, SMARCA4. STK11, TP53, TSC1, TSC2, and VHL.  The following genes were evaluated for sequence changes only: SDHA and HOXB13 c.251G>A variant only.    05/30/2019 Surgery   Right lumpectomy Donne Hazel) [phone]): intermediate grade DCIS, 0.9cm, clear margins. ER+, PR-. No regional lymph nodes were examined.   05/30/2019 Cancer Staging   Staging form: Breast, AJCC 8th Edition - Pathologic stage from 05/30/2019: Stage 0 (pTis (DCIS), pN0, cM0, ER+, PR-)    06/26/2019 - 07/25/2019 Radiation Therapy   The patient initially received a dose of 40.05 Gy in 15 fractions to the breast using whole-breast tangent fields. This was delivered using a 3-D conformal technique. The pt received a boost delivering an additional 12 Gy in 6 fractions using a electron boost with 33mV electrons. The total dose was 52.05 Gy.   07/2019 - 07/2024 Anti-estrogen oral therapy   Tamoxifen daily     CHIEF COMPLIANT: Follow-up of right breast DCIS on tamoxifen    INTERVAL HISTORY: SBonney Berresis a 76y.o. with above-mentioned history of right breast. She presents to the clinic today for a follow-up. She states that she was still having some discharge until she cut the tamoxifen to 5 mg. Overall she is tolerating it. She just started back exercising she is back using her treadmill and taking her steps.   ALLERGIES:  is allergic to latex and adhesive [tape].  MEDICATIONS:  Current Outpatient Medications  Medication Sig Dispense Refill  amLODipine (NORVASC) 2.5 MG tablet TAKE ONE TABLET BY MOUTH DAILY 90 tablet 1   ascorbic acid (VITAMIN C) 500 MG tablet Take 500 mg by mouth daily.     Calcium Carbonate-Vitamin D 600-400 MG-UNIT tablet Take 1 tablet by mouth 2 (two)  times daily. '@lunch'  and in the evening     cholecalciferol (VITAMIN D) 1000 UNITS tablet Take 1,000 Units by mouth 2 (two) times daily.      Coenzyme Q10 (CO Q-10) 100 MG CAPS Take 1 capsule by mouth every morning.      COVID-19 mRNA bivalent vaccine, Pfizer, (PFIZER COVID-19 VAC BIVALENT) injection Inject into the muscle. 0.3 mL 0   fish oil-omega-3 fatty acids 1000 MG capsule Take 1 g by mouth every morning.     FLAXSEED, LINSEED, PO Take 1,000 mg by mouth daily.     hydrocortisone (ANUSOL-HC) 2.5 % rectal cream Place 1 application rectally 2 (two) times daily. 30 g 0   Magnesium 250 MG TABS Take 1 tablet by mouth every evening.     Multiple Vitamins-Minerals (MULTIVITAMIN WITH MINERALS) tablet Take 1 tablet by mouth every morning.     simvastatin (ZOCOR) 20 MG tablet TAKE ONE TABLET BY MOUTH EVERY NIGHT AT BEDTIME 90 tablet 3   tamoxifen (NOLVADEX) 10 MG tablet Take 0.5 tablets (5 mg total) by mouth daily. 90 tablet 2   vitamin E 400 UNIT capsule Take 400 Units by mouth every morning.     zinc gluconate 50 MG tablet Take 50 mg by mouth daily.     No current facility-administered medications for this visit.    PHYSICAL EXAMINATION: ECOG PERFORMANCE STATUS: 1 - Symptomatic but completely ambulatory  Vitals:   01/15/22 1010  BP: (!) 117/97  Pulse: 65  Resp: 18  Temp: (!) 97.2 F (36.2 C)  SpO2: 100%   Filed Weights   01/15/22 1010  Weight: 124 lb (56.2 kg)    BREAST: No palpable masses or nodules in either right or left breasts. No palpable axillary supraclavicular or infraclavicular adenopathy no breast tenderness or nipple discharge. (exam performed in the presence of a chaperone)  LABORATORY DATA:  I have reviewed the data as listed    Latest Ref Rng & Units 04/08/2021   11:29 AM 04/04/2020    9:24 AM 05/17/2019    8:39 AM  CMP  Glucose 65 - 99 mg/dL 95  93  87   BUN 7 - 25 mg/dL '15  11  16   ' Creatinine 0.60 - 1.00 mg/dL 0.77  0.77  0.83   Sodium 135 - 146 mmol/L 139   140  141   Potassium 3.5 - 5.3 mmol/L 4.3  4.4  4.1   Chloride 98 - 110 mmol/L 102  104  103   CO2 20 - 32 mmol/L '29  26  26   ' Calcium 8.6 - 10.4 mg/dL 9.4  9.3  9.6   Total Protein 6.1 - 8.1 g/dL 7.0  6.8  7.7   Total Bilirubin 0.2 - 1.2 mg/dL 0.5  0.5  0.4   Alkaline Phos 38 - 126 U/L   76   AST 10 - 35 U/L '20  19  18   ' ALT 6 - 29 U/L '17  14  17     ' Lab Results  Component Value Date   WBC 4.6 04/08/2021   HGB 13.8 04/08/2021   HCT 40.5 04/08/2021   MCV 91.8 04/08/2021   PLT 215 04/08/2021   NEUTROABS 2,949 04/08/2021  ASSESSMENT & PLAN:  Ductal carcinoma in situ (DCIS) of right breast 05/10/2019:Routine screening mammogram detected a 0.7cm group of calcifications in the upper outer right breast, a 0.5cm asymmetry in the outer right breast, and no axillary adenopathy. Biopsy DCIS with calcifications, intermediate to high grade, ER+ 90%, PR- 0%.   Recommendation: 1. Breast conserving surgery 05/30/2019:  Rt Lumpectomy: DCIS IG 0.9 cm margins neg, ER 90%, PR 0% 2. Followed by adjuvant radiation therapy 06/27/2019-07/21/2019 3. Followed by antiestrogen therapy with tamoxifen 5 years to start 07/31/2019 Genetics consult Neg  ------------------------------------------------------------------------------------------------------------------------------------- Tamoxifen toxicities: Occasional hot flashes Vaginal discharge: Resolved after we reduce the dosage to 5 mg daily.   Breast Cancer Surveillance: 1. Breast Exam: 01/15/2022: Benign 2. Mammograms:  05/02/2021: [facility]: Benign breast density category B Bone density 05/30/2021: T score -1.8: Osteopenia   RTC in 1 year     No orders of the defined types were placed in this encounter.  The patient has a good understanding of the overall plan. she agrees with it. she will call with any problems that may develop before the next visit here. Total time spent: 30 mins including face to face time and time spent for planning, charting and  co-ordination of care   Harriette Ohara, MD 01/15/22    I Gardiner Coins am scribing for Dr. Lindi Adie  I have reviewed the above documentation for accuracy and completeness, and I agree with the above.

## 2022-01-19 ENCOUNTER — Other Ambulatory Visit (HOSPITAL_BASED_OUTPATIENT_CLINIC_OR_DEPARTMENT_OTHER): Payer: Self-pay

## 2022-01-20 ENCOUNTER — Other Ambulatory Visit: Payer: Self-pay | Admitting: Internal Medicine

## 2022-01-21 ENCOUNTER — Other Ambulatory Visit (HOSPITAL_BASED_OUTPATIENT_CLINIC_OR_DEPARTMENT_OTHER): Payer: Self-pay

## 2022-04-09 ENCOUNTER — Other Ambulatory Visit: Payer: Medicare HMO

## 2022-04-09 DIAGNOSIS — R5383 Other fatigue: Secondary | ICD-10-CM | POA: Diagnosis not present

## 2022-04-09 DIAGNOSIS — I10 Essential (primary) hypertension: Secondary | ICD-10-CM | POA: Diagnosis not present

## 2022-04-09 DIAGNOSIS — E7849 Other hyperlipidemia: Secondary | ICD-10-CM

## 2022-04-10 LAB — CBC WITH DIFFERENTIAL/PLATELET
Absolute Monocytes: 521 cells/uL (ref 200–950)
Basophils Absolute: 29 cells/uL (ref 0–200)
Basophils Relative: 0.7 %
Eosinophils Absolute: 71 cells/uL (ref 15–500)
Eosinophils Relative: 1.7 %
HCT: 39 % (ref 35.0–45.0)
Hemoglobin: 13.3 g/dL (ref 11.7–15.5)
Lymphs Abs: 1084 cells/uL (ref 850–3900)
MCH: 31.2 pg (ref 27.0–33.0)
MCHC: 34.1 g/dL (ref 32.0–36.0)
MCV: 91.5 fL (ref 80.0–100.0)
MPV: 11.2 fL (ref 7.5–12.5)
Monocytes Relative: 12.4 %
Neutro Abs: 2495 cells/uL (ref 1500–7800)
Neutrophils Relative %: 59.4 %
Platelets: 225 10*3/uL (ref 140–400)
RBC: 4.26 10*6/uL (ref 3.80–5.10)
RDW: 12.7 % (ref 11.0–15.0)
Total Lymphocyte: 25.8 %
WBC: 4.2 10*3/uL (ref 3.8–10.8)

## 2022-04-10 LAB — COMPLETE METABOLIC PANEL WITH GFR
AG Ratio: 1.7 (calc) (ref 1.0–2.5)
ALT: 16 U/L (ref 6–29)
AST: 20 U/L (ref 10–35)
Albumin: 4.3 g/dL (ref 3.6–5.1)
Alkaline phosphatase (APISO): 48 U/L (ref 37–153)
BUN: 14 mg/dL (ref 7–25)
CO2: 26 mmol/L (ref 20–32)
Calcium: 9.3 mg/dL (ref 8.6–10.4)
Chloride: 104 mmol/L (ref 98–110)
Creat: 0.74 mg/dL (ref 0.60–1.00)
Globulin: 2.6 g/dL (calc) (ref 1.9–3.7)
Glucose, Bld: 90 mg/dL (ref 65–99)
Potassium: 4.8 mmol/L (ref 3.5–5.3)
Sodium: 140 mmol/L (ref 135–146)
Total Bilirubin: 0.4 mg/dL (ref 0.2–1.2)
Total Protein: 6.9 g/dL (ref 6.1–8.1)
eGFR: 85 mL/min/{1.73_m2} (ref >=60)

## 2022-04-10 LAB — LIPID PANEL
Cholesterol: 164 mg/dL (ref <200)
HDL: 69 mg/dL (ref >=50)
LDL Cholesterol (Calc): 81 mg/dL (calc)
Non-HDL Cholesterol (Calc): 95 mg/dL (calc) (ref <130)
Total CHOL/HDL Ratio: 2.4 (calc) (ref <5.0)
Triglycerides: 68 mg/dL (ref <150)

## 2022-04-10 LAB — TSH: TSH: 3.15 mIU/L (ref 0.40–4.50)

## 2022-04-13 ENCOUNTER — Ambulatory Visit (INDEPENDENT_AMBULATORY_CARE_PROVIDER_SITE_OTHER): Payer: Medicare HMO | Admitting: Internal Medicine

## 2022-04-13 ENCOUNTER — Encounter: Payer: Self-pay | Admitting: Internal Medicine

## 2022-04-13 VITALS — BP 124/76 | HR 90 | Temp 97.9°F | Ht 62.25 in | Wt 123.8 lb

## 2022-04-13 DIAGNOSIS — G620 Drug-induced polyneuropathy: Secondary | ICD-10-CM

## 2022-04-13 DIAGNOSIS — Z823 Family history of stroke: Secondary | ICD-10-CM | POA: Diagnosis not present

## 2022-04-13 DIAGNOSIS — D0511 Intraductal carcinoma in situ of right breast: Secondary | ICD-10-CM | POA: Diagnosis not present

## 2022-04-13 DIAGNOSIS — I10 Essential (primary) hypertension: Secondary | ICD-10-CM | POA: Diagnosis not present

## 2022-04-13 DIAGNOSIS — Z0000 Encounter for general adult medical examination without abnormal findings: Secondary | ICD-10-CM

## 2022-04-13 DIAGNOSIS — Z8542 Personal history of malignant neoplasm of other parts of uterus: Secondary | ICD-10-CM

## 2022-04-13 LAB — POCT URINALYSIS DIPSTICK
Bilirubin, UA: NEGATIVE
Blood, UA: NEGATIVE
Glucose, UA: NEGATIVE
Ketones, UA: NEGATIVE
Leukocytes, UA: NEGATIVE
Nitrite, UA: NEGATIVE
Protein, UA: NEGATIVE
Spec Grav, UA: 1.01 (ref 1.010–1.025)
Urobilinogen, UA: 0.2 E.U./dL
pH, UA: 6 (ref 5.0–8.0)

## 2022-04-13 NOTE — Patient Instructions (Addendum)
Vaccines discussed. TSH has risen. Let's recheck in 6 months. It is still WNL.

## 2022-04-13 NOTE — Progress Notes (Signed)
Annual Wellness Visit     Patient: Annette Tucker, Female    DOB: [birth year], 76 y.o.   MRN: [id number] Visit Date: 04/13/2022  Chief Complaint  Patient presents with   Annual Exam   Subjective    Annette Tucker is a 76 y.o. Female who presents today for her Annual Wellness Visit.  HPI 76 year old Female seen for Medicare Wellness, health maintenance exam and evaluation of medical issues.Vaccines discussed. Got Covid booster in the Spring. Should get Covid vaccine with new variant this Fall.  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  Biopsy showed DCIS with calcifications intermediate to high-grade.  Tumor was ER positive, PR negative.  She had genetic testing with no pathogenic variants identified.  She is being followed at the cancer center.  She had radiation therapy.  She had right lumpectomy May 30, 2019 by Dr. Donne Hazel.  She will be on anastrozole for a total of 5 years.  History of hypertension well-controlled on low-dose amlodipine.  History of hyperlipidemia treated with Zocor 20 mg daily.  History of small retinal hemorrhage followed by Dr. Marica Otter.  History of osteopenia and osteoarthritis.  Stress fracture right foot seen by Dr. Durward Fortes in March 2019.  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  B12 level has been checked previously.  Was diagnosed with mitral valve prolapse in 2011 on echocardiogram.  She takes vitamin D supplement.  She took Fosamax for couple of years for osteopenia.  Had tonsillectomy at age 44, TMJ mandibular advancement surgery 1986.  Colonoscopy September 2006 and in 2016.  In 2015, she noted vaginal spotting.  She had a D&C and it revealed endometrial cancer.  She was referred to GYN oncologist.  Subsequently underwent robotic assisted TAH/BSO with lymph node dissection.  Lymph nodes were negative.  She had a high-grade serous endometrial carcinoma.   She received radiation treatment.  She has had chemotherapy with Taxol and carboplatin and is followed by GYN oncology.  Social history: She completed college and worked in Scientist, research (medical) for a number of years.  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  She is a native of [facility] and is single.  Does not smoke or consume alcohol.  Family history: Mother with history of Alzheimer's dementia.  Father died at age 18 with kidney disease and had history of MI, hypertension and CVA.  1 brother with hyperlipidemia.    Colonoscopy due 2026  Patient Care Team: [name], MD as PCP - General (Internal Medicine) Rolm Bookbinder, MD as Consulting Physician (General Surgery) Nicholas Lose, MD as Consulting Physician (Hematology and Oncology) Gery Pray, MD as Consulting Physician (Radiation Oncology)  Review of Systems generally feels well   Objective    Vitals: BP 124/76   Pulse 90   Temp 97.9 F (36.6 C) (Tympanic)   Ht 5' 2.25" (1.581 m)   Wt 123 lb 12.8 oz (56.2 kg)   SpO2 99%   BMI 22.46 kg/m   Physical Exam Skin: Warm and dry.  No cervical adenopathy, thyromegaly or carotid bruits.  Chest is clear.  Cardiac exam: Regular rate and rhythm without ectopy.  Breasts are without masses.  Abdomen is soft nondistended without hepatosplenomegaly masses or tenderness.  No lower extremity edema or deformity.  Brief neurological exam is intact without gross focal deficits.  Affect, thought, judgment are normal.  Urine dipstick is  normal.  CBC with differential, c-Met, lipid panel and TSH were all normal  Most recent functional status assessment:    04/13/2022    9:54 AM  In your present state of health, do you have any difficulty performing the following activities:  Hearing? 0  Vision? 0  Difficulty concentrating or making decisions? 0  Walking or climbing stairs? 0  Dressing or bathing? 0  Doing errands, shopping? 0  Preparing Food and  eating ? N  Using the Toilet? N  In the past six months, have you accidently leaked urine? N  Managing your Medications? N  Managing your Finances? N   Most recent fall risk assessment:    04/13/2022    9:53 AM  Fall Risk   Falls in the past year? 0  Number falls in past yr: 0  Injury with Fall? 0  Risk for fall due to : No Fall Risks  Follow up Falls evaluation completed    Most recent depression screenings:    04/13/2022    9:53 AM 04/10/2021   10:09 AM  PHQ 2/9 Scores  PHQ - 2 Score 0 0   Most recent cognitive screening:    04/13/2022    9:55 AM  6CIT Screen  What Year? 0 points  What month? 0 points  What time? 0 points  Count back from 20 0 points  Months in reverse 0 points  Repeat phrase 0 points  Total Score 0 points       Assessment & Plan   Her TSH has risen over the past couple of years slightly from 2.63 when  checked 2 years ago to 4.05 a year ago to 3.15 this year.  Despite this, she feels well and does not realize that there is been a change in her lab work.  We have decided we will recheck this in 6 months.  It is still within normal limits and I am not terribly concerned about it but she worries some.  Mild hypertension treated with amlodipine  History of endometrial cancer followed by GYN oncology  DCIS had lumpectomy December 2021 and 21 radiation treatments followed at the cancer center  History of peripheral neuropathy which started several years after chemotherapy for endometrial cancer.  B12 level has been checked and was normal  Family history of MI in father who also had CVA and hypertension.  She takes statin medication.  Plan: She will return in 6 months for TSH in 101-monthrecheck appointment.  Vaccines discussed.       Annual wellness visit done today including the all of the following: Reviewed patient's Family Medical History Reviewed and updated list of patient's medical providers Assessment of cognitive impairment was  done Assessed patient's functional ability Established a written schedule for health screening sGreggCompleted and Reviewed  Discussed health benefits of physical activity, and encouraged her to engage in regular exercise appropriate for her age and condition.         {I, MElby Showers MD, have reviewed all documentation for this visit. The documentation on 04/13/22 for the exam, diagnosis, procedures, and orders are all accurate and complete.   LaVon DBarron Alvine CMA

## 2022-04-15 ENCOUNTER — Other Ambulatory Visit (HOSPITAL_BASED_OUTPATIENT_CLINIC_OR_DEPARTMENT_OTHER): Payer: Self-pay

## 2022-04-15 MED ORDER — FLUAD QUADRIVALENT 0.5 ML IM PRSY
PREFILLED_SYRINGE | INTRAMUSCULAR | 0 refills | Status: DC
Start: 2022-04-15 — End: 2023-04-15
  Filled 2022-04-15: qty 0.5, 1d supply, fill #0

## 2022-04-20 ENCOUNTER — Other Ambulatory Visit: Payer: Self-pay | Admitting: Internal Medicine

## 2022-04-28 DIAGNOSIS — Z0100 Encounter for examination of eyes and vision without abnormal findings: Secondary | ICD-10-CM | POA: Diagnosis not present

## 2022-04-28 DIAGNOSIS — H5203 Hypermetropia, bilateral: Secondary | ICD-10-CM | POA: Diagnosis not present

## 2022-05-12 DIAGNOSIS — Z124 Encounter for screening for malignant neoplasm of cervix: Secondary | ICD-10-CM | POA: Diagnosis not present

## 2022-05-12 DIAGNOSIS — Z01411 Encounter for gynecological examination (general) (routine) with abnormal findings: Secondary | ICD-10-CM | POA: Diagnosis not present

## 2022-05-12 DIAGNOSIS — R87629 Unspecified abnormal cytological findings in specimens from vagina: Secondary | ICD-10-CM | POA: Diagnosis not present

## 2022-05-12 DIAGNOSIS — Z6825 Body mass index (BMI) 25.0-25.9, adult: Secondary | ICD-10-CM | POA: Diagnosis not present

## 2022-05-12 DIAGNOSIS — Z0000 Encounter for general adult medical examination without abnormal findings: Secondary | ICD-10-CM | POA: Diagnosis not present

## 2022-05-12 DIAGNOSIS — Z01419 Encounter for gynecological examination (general) (routine) without abnormal findings: Secondary | ICD-10-CM | POA: Diagnosis not present

## 2022-05-13 DIAGNOSIS — Z853 Personal history of malignant neoplasm of breast: Secondary | ICD-10-CM | POA: Diagnosis not present

## 2022-05-13 LAB — HM MAMMOGRAPHY

## 2022-05-14 ENCOUNTER — Encounter: Payer: Self-pay | Admitting: Internal Medicine

## 2022-05-14 ENCOUNTER — Encounter: Payer: Self-pay | Admitting: Hematology and Oncology

## 2022-07-20 ENCOUNTER — Other Ambulatory Visit: Payer: Self-pay | Admitting: Internal Medicine

## 2022-09-15 ENCOUNTER — Other Ambulatory Visit: Payer: Self-pay | Admitting: Hematology and Oncology

## 2022-10-05 ENCOUNTER — Telehealth: Payer: Self-pay | Admitting: Internal Medicine

## 2022-10-05 NOTE — Telephone Encounter (Signed)
Annette Tucker called to say she does not think she has and UTI, however she would like her urine checked at her upcoming appointments, because she is going to the bathroom often. I did ask if she thought she needed to come in before and she said no. Lab appointment on 10/13/2022 and OV 10/16/22

## 2022-10-09 NOTE — Progress Notes (Signed)
Patient Care Team: Margaree Mackintosh, MD as PCP - General (Internal Medicine) Emelia Loron, MD as Consulting Physician (General Surgery) Serena Croissant, MD as Consulting Physician (Hematology and Oncology) Antony Blackbird, MD as Consulting Physician (Radiation Oncology)  Visit Date: 10/16/22  Subjective:    Patient ID: Annette Tucker , Female   DOB: 02/12/1946, 77 y.o.    MRN: [id number]   77 y.o. Female presents today for a 6 month follow-up regarding TSH. Patient has a past medical history of endometrial cancer 2015, heart murmur, hyperlipidemia, hypertension.  Suspects she may have a UTI. No specific symptoms just not feeling quite right. Was having frequent urination on 10/05/22.  Having concerns over recent hair loss. TSH at 2.06 on 10/13/22.  Past Medical History:  Diagnosis Date   Endometrial cancer    2015   Family history of bone cancer    Family history of breast cancer    Heart murmur    as child, no problems    Hyperlipidemia    Hypertension    Radiation 08/21/14, 08/28/14, 09/04/14, 09/06/14, 09/18/14   vaginal cuff brachytherapy 30 gray   Seasonal allergies      Family History  Problem Relation Age of Onset   Mental retardation Mother    Kidney disease Father    Stroke Father    Cancer Maternal Uncle        unknown   Breast cancer Maternal Aunt        dx 27s   Bone cancer Paternal Uncle    Breast cancer Cousin        pat cousins x2, dx in their 38s   Bone cancer Maternal Uncle     Social History   Social History Narrative   Not on file      Review of Systems  Constitutional:  Negative for fever and malaise/fatigue.  HENT:  Negative for congestion.   Eyes:  Negative for blurred vision.  Respiratory:  Negative for cough and shortness of breath.   Cardiovascular:  Negative for chest pain, palpitations and leg swelling.  Gastrointestinal:  Negative for vomiting.  Musculoskeletal:  Negative for back pain.  Skin:  Negative for rash.   Neurological:  Negative for loss of consciousness and headaches.        Objective:   Vitals: BP 124/76   Pulse 86   Temp 97.7 F (36.5 C) (Tympanic)   Ht 5' 2.25" (1.581 m)   Wt 126 lb 12.8 oz (57.5 kg)   SpO2 98%   BMI 23.01 kg/m    Physical Exam Vitals and nursing note reviewed.  Constitutional:      General: She is not in acute distress.    Appearance: Normal appearance. She is not toxic-appearing.  HENT:     Head: Normocephalic and atraumatic.  Cardiovascular:     Rate and Rhythm: Normal rate and regular rhythm. No extrasystoles are present.    Pulses: Normal pulses.     Heart sounds: Normal heart sounds. No murmur heard.    No friction rub. No gallop.  Pulmonary:     Effort: Pulmonary effort is normal. No respiratory distress.     Breath sounds: Normal breath sounds. No wheezing or rales.  Skin:    General: Skin is warm and dry.  Neurological:     Mental Status: She is alert and oriented to person, place, and time. Mental status is at baseline.  Psychiatric:        Mood and Affect: Mood normal.  Behavior: Behavior normal.        Thought Content: Thought content normal.        Judgment: Judgment normal.       Results:   Studies obtained and personally reviewed by me:    Labs:       Component Value Date/Time   NA 140 04/09/2022 0916   NA 141 07/24/2015 1412   K 4.8 04/09/2022 0916   K 3.8 07/24/2015 1412   CL 104 04/09/2022 0916   CO2 26 04/09/2022 0916   CO2 28 07/24/2015 1412   GLUCOSE 90 04/09/2022 0916   GLUCOSE 110 07/24/2015 1412   BUN 14 04/09/2022 0916   BUN 18.0 07/24/2015 1412   CREATININE 0.74 04/09/2022 0916   CREATININE 0.8 07/24/2015 1412   CALCIUM 9.3 04/09/2022 0916   CALCIUM 9.7 07/24/2015 1412   PROT 6.9 04/09/2022 0916   PROT 7.5 07/24/2015 1412   ALBUMIN 4.5 05/17/2019 0839   ALBUMIN 4.1 07/24/2015 1412   AST 20 04/09/2022 0916   AST 18 05/17/2019 0839   AST 19 07/24/2015 1412   ALT 16 04/09/2022 0916   ALT  17 05/17/2019 0839   ALT 15 07/24/2015 1412   ALKPHOS 76 05/17/2019 0839   ALKPHOS 83 07/24/2015 1412   BILITOT 0.4 04/09/2022 0916   BILITOT 0.4 05/17/2019 0839   BILITOT <0.30 07/24/2015 1412   GFRNONAA 78 04/04/2020 0924   GFRAA 90 04/04/2020 0924     Lab Results  Component Value Date   WBC 4.2 04/09/2022   HGB 13.3 04/09/2022   HCT 39.0 04/09/2022   MCV 91.5 04/09/2022   PLT 225 04/09/2022    Lab Results  Component Value Date   CHOL 164 04/09/2022   HDL 69 04/09/2022   LDLCALC 81 04/09/2022   TRIG 68 04/09/2022   CHOLHDL 2.4 04/09/2022    No results found for: "HGBA1C"   Lab Results  Component Value Date   TSH 2.06 10/13/2022      Assessment & Plan:   Possible UTI: urinalysis showed trace leukocytes. Urine sent for culture.  Vaccine counseling: administered pneumococcal 20 vaccine.  Prescribed Levaquin for travel/UTI.    I,Alexander Ruley,acting as a Neurosurgeon for Margaree Mackintosh, MD.,have documented all relevant documentation on the behalf of Margaree Mackintosh, MD,as directed by  Margaree Mackintosh, MD while in the presence of Margaree Mackintosh, MD.   ***

## 2022-10-13 ENCOUNTER — Other Ambulatory Visit: Payer: Medicare HMO

## 2022-10-13 DIAGNOSIS — R5383 Other fatigue: Secondary | ICD-10-CM | POA: Diagnosis not present

## 2022-10-13 DIAGNOSIS — R7989 Other specified abnormal findings of blood chemistry: Secondary | ICD-10-CM

## 2022-10-13 LAB — TSH: TSH: 2.06 mIU/L (ref 0.40–4.50)

## 2022-10-15 ENCOUNTER — Ambulatory Visit: Payer: Medicare HMO | Admitting: Internal Medicine

## 2022-10-16 ENCOUNTER — Ambulatory Visit (INDEPENDENT_AMBULATORY_CARE_PROVIDER_SITE_OTHER): Payer: Medicare HMO | Admitting: Internal Medicine

## 2022-10-16 ENCOUNTER — Encounter: Payer: Self-pay | Admitting: Internal Medicine

## 2022-10-16 VITALS — BP 124/76 | HR 86 | Temp 97.7°F | Ht 62.25 in | Wt 126.8 lb

## 2022-10-16 DIAGNOSIS — R399 Unspecified symptoms and signs involving the genitourinary system: Secondary | ICD-10-CM | POA: Diagnosis not present

## 2022-10-16 DIAGNOSIS — Z23 Encounter for immunization: Secondary | ICD-10-CM | POA: Diagnosis not present

## 2022-10-16 DIAGNOSIS — Z8542 Personal history of malignant neoplasm of other parts of uterus: Secondary | ICD-10-CM | POA: Diagnosis not present

## 2022-10-16 DIAGNOSIS — G620 Drug-induced polyneuropathy: Secondary | ICD-10-CM

## 2022-10-16 DIAGNOSIS — Z86000 Personal history of in-situ neoplasm of breast: Secondary | ICD-10-CM | POA: Diagnosis not present

## 2022-10-16 DIAGNOSIS — Z7184 Encounter for health counseling related to travel: Secondary | ICD-10-CM

## 2022-10-16 DIAGNOSIS — I10 Essential (primary) hypertension: Secondary | ICD-10-CM

## 2022-10-16 DIAGNOSIS — R82998 Other abnormal findings in urine: Secondary | ICD-10-CM

## 2022-10-16 LAB — POCT URINALYSIS DIPSTICK
Bilirubin, UA: NEGATIVE
Blood, UA: NEGATIVE
Glucose, UA: NEGATIVE
Ketones, UA: NEGATIVE
Nitrite, UA: NEGATIVE
Protein, UA: NEGATIVE
Spec Grav, UA: 1.01 (ref 1.010–1.025)
Urobilinogen, UA: 0.2 E.U./dL
pH, UA: 6.5 (ref 5.0–8.0)

## 2022-10-16 MED ORDER — LEVOFLOXACIN 500 MG PO TABS: 500.0000 mg | ORAL_TABLET | Freq: Every day | ORAL | 0 refills | Status: AC

## 2022-10-16 NOTE — Patient Instructions (Addendum)
Pneumococcal vaccine given. Urine is pending. Levaquin prescribed for upcoming  travel. RTC in 6 months for medicare wellness and health maintenance exam. It was a pleasure to see you today.

## 2022-10-18 LAB — URINE CULTURE
MICRO NUMBER:: 14849064
SPECIMEN QUALITY:: ADEQUATE

## 2022-10-23 DIAGNOSIS — L570 Actinic keratosis: Secondary | ICD-10-CM | POA: Diagnosis not present

## 2022-10-23 DIAGNOSIS — L814 Other melanin hyperpigmentation: Secondary | ICD-10-CM | POA: Diagnosis not present

## 2022-10-23 DIAGNOSIS — D2371 Other benign neoplasm of skin of right lower limb, including hip: Secondary | ICD-10-CM | POA: Diagnosis not present

## 2022-10-23 DIAGNOSIS — Z85828 Personal history of other malignant neoplasm of skin: Secondary | ICD-10-CM | POA: Diagnosis not present

## 2022-10-23 DIAGNOSIS — L820 Inflamed seborrheic keratosis: Secondary | ICD-10-CM | POA: Diagnosis not present

## 2022-10-23 DIAGNOSIS — D225 Melanocytic nevi of trunk: Secondary | ICD-10-CM | POA: Diagnosis not present

## 2022-10-23 DIAGNOSIS — L578 Other skin changes due to chronic exposure to nonionizing radiation: Secondary | ICD-10-CM | POA: Diagnosis not present

## 2022-10-23 DIAGNOSIS — D2271 Melanocytic nevi of right lower limb, including hip: Secondary | ICD-10-CM | POA: Diagnosis not present

## 2022-10-23 DIAGNOSIS — L821 Other seborrheic keratosis: Secondary | ICD-10-CM | POA: Diagnosis not present

## 2022-11-05 ENCOUNTER — Encounter: Payer: Self-pay | Admitting: Licensed Clinical Social Worker

## 2022-11-05 NOTE — Progress Notes (Signed)
UPDATE: VUS in MSH2 called c.5C>T has been reclassified to "Benign." The amended report date is 10/26/2022.

## 2022-11-24 ENCOUNTER — Other Ambulatory Visit (HOSPITAL_BASED_OUTPATIENT_CLINIC_OR_DEPARTMENT_OTHER): Payer: Self-pay

## 2022-11-24 DIAGNOSIS — R87629 Unspecified abnormal cytological findings in specimens from vagina: Secondary | ICD-10-CM | POA: Diagnosis not present

## 2022-11-24 DIAGNOSIS — C541 Malignant neoplasm of endometrium: Secondary | ICD-10-CM | POA: Diagnosis not present

## 2022-11-24 DIAGNOSIS — R85619 Unspecified abnormal cytological findings in specimens from anus: Secondary | ICD-10-CM | POA: Diagnosis not present

## 2022-11-24 DIAGNOSIS — Z79810 Long term (current) use of selective estrogen receptor modulators (SERMs): Secondary | ICD-10-CM | POA: Diagnosis not present

## 2022-11-24 MED ORDER — COMIRNATY 30 MCG/0.3ML IM SUSY
0.3000 mL | PREFILLED_SYRINGE | Freq: Once | INTRAMUSCULAR | 0 refills | Status: AC
  Filled 2022-11-24: qty 0.3, 1d supply, fill #0

## 2022-12-07 ENCOUNTER — Encounter: Payer: Self-pay | Admitting: Hematology and Oncology

## 2023-01-14 ENCOUNTER — Other Ambulatory Visit: Payer: Self-pay

## 2023-01-14 MED ORDER — AMLODIPINE BESYLATE 2.5 MG PO TABS: 2.5000 mg | ORAL_TABLET | Freq: Every day | ORAL | 1 refills | Status: DC

## 2023-01-17 NOTE — Progress Notes (Signed)
Patient Care Team: Margaree Mackintosh, MD as PCP - General (Internal Medicine) Emelia Loron, MD as Consulting Physician (General Surgery) Serena Croissant, MD as Consulting Physician (Hematology and Oncology) Antony Blackbird, MD as Consulting Physician (Radiation Oncology)  DIAGNOSIS: No diagnosis found.  SUMMARY OF ONCOLOGIC HISTORY: Oncology History  Endometrial ca [facility])  05/17/2014 Initial Diagnosis   Endometrial ca   06/05/2014 Surgery   TRH/BSO and staging. IAUPSC 0/12 nodes, no LVSI   07/12/2014 -  Chemotherapy   paclitaxel and carboplatin   08/21/2014 - 09/18/2014 Radiation Therapy     Uterine cancer (HCC) (Resolved)  05/17/2014 Initial Diagnosis   Uterine cancer    - 11/01/2014 Chemotherapy   Completed 6 cycles of paclitaxel and carboplatin based chemotherapy   06/05/2014 Surgery   IA UPSC    Radiation Therapy     Ductal carcinoma in situ (DCIS) of right breast  05/10/2019 Initial Diagnosis   Routine screening mammogram detected a 0.7cm group of calcifications in the upper outer right breast, a 0.5cm asymmetry in the outer right breast, and no axillary adenopathy. Biopsy DCIS with calcifications, intermediate to high grade, ER+ 90%, PR- 0%.   05/24/2019 Genetic Testing   No pathogenic variants identified. VUS in MSH2 called c.5C>T identified on the [facility] STAT Breast Cancer + Common Hereditary Cancers Panel. The report date is 05/23/2019.  The STAT Breast cancer panel offered by [facility] includes sequencing and rearrangement analysis for the following 9 genes:  ATM, BRCA1, BRCA2, CDH1, CHEK2, PALB2, PTEN, STK11 and TP53.    The Common Hereditary Cancers Panel offered by [facility] includes sequencing and/or deletion duplication testing of the following 48 genes: APC, ATM, AXIN2, BARD1, BMPR1A, BRCA1, BRCA2, BRIP1, CDH1, CDKN2A (p14ARF), CDKN2A (p16INK4a), CKD4, CHEK2, CTNNA1, DICER1, EPCAM (Deletion/duplication testing only), GREM1 (promoter region deletion/duplication  testing only), KIT, MEN1, MLH1, MSH2, MSH3, MSH6, MUTYH, NBN, NF1, NHTL1, PALB2, PDGFRA, PMS2, POLD1, POLE, PTEN, RAD50, RAD51C, RAD51D, RNF43, SDHB, SDHC, SDHD, SMAD4, SMARCA4. STK11, TP53, TSC1, TSC2, and VHL.  The following genes were evaluated for sequence changes only: SDHA and HOXB13 c.251G>A variant only.    05/30/2019 Surgery   Right lumpectomy Dwain Sarna) [phone]): intermediate grade DCIS, 0.9cm, clear margins. ER+, PR-. No regional lymph nodes were examined.   05/30/2019 Cancer Staging   Staging form: Breast, AJCC 8th Edition - Pathologic stage from 05/30/2019: Stage 0 (pTis (DCIS), pN0, cM0, ER+, PR-)    06/26/2019 - 07/25/2019 Radiation Therapy   The patient initially received a dose of 40.05 Gy in 15 fractions to the breast using whole-breast tangent fields. This was delivered using a 3-D conformal technique. The pt received a boost delivering an additional 12 Gy in 6 fractions using a electron boost with electrons. The total dose was 52.05 Gy.   07/2019 - 07/2024 Anti-estrogen oral therapy   Tamoxifen daily     CHIEF COMPLIANT: Follow-up of right breast DCIS on tamoxifen   INTERVAL HISTORY: Annette Tucker is a 77 y.o. with above-mentioned history of right breast. She presents to the clinic today for a follow-up.     ALLERGIES:  is allergic to latex and adhesive [tape].  MEDICATIONS:  Current Outpatient Medications  Medication Sig Dispense Refill   amLODipine (NORVASC) 2.5 MG tablet Take 1 tablet (2.5 mg total) by mouth daily. 90 tablet 1   ascorbic acid (VITAMIN C) 500 MG tablet Take 500 mg by mouth daily.     Calcium Carbonate-Vitamin D 600-400 MG-UNIT tablet Take 1 tablet by mouth daily. @lunch  and in  the evening     cholecalciferol (VITAMIN D) 1000 UNITS tablet Take 1,000 Units by mouth 2 (two) times daily.      Coenzyme Q10 (CO Q-10) 100 MG CAPS Take 1 capsule by mouth every morning.      COVID-19 mRNA bivalent vaccine, Pfizer, (PFIZER COVID-19 VAC  BIVALENT) injection Inject into the muscle. 0.3 mL 0   fish oil-omega-3 fatty acids 1000 MG capsule Take 1 g by mouth every morning.     FLAXSEED, LINSEED, PO Take 1,000 mg by mouth daily.     hydrocortisone (ANUSOL-HC) 2.5 % rectal cream Place 1 application rectally 2 (two) times daily. (Patient not taking: Reported on 10/16/2022) 30 g 0   influenza vaccine adjuvanted (FLUAD QUADRIVALENT) 0.5 ML injection Inject into the muscle. 0.5 mL 0   Magnesium 250 MG TABS Take 1 tablet by mouth every evening.     Multiple Vitamins-Minerals (MULTIVITAMIN WITH MINERALS) tablet Take 1 tablet by mouth every morning.     simvastatin (ZOCOR) 20 MG tablet TAKE ONE TABLET BY MOUTH EVERY NIGHT AT BEDTIME 90 tablet 3   tamoxifen (NOLVADEX) 10 MG tablet TAKE 1/2 TABLET BY MOUTH DAILY 45 tablet 3   vitamin E 400 UNIT capsule Take 400 Units by mouth every morning.     zinc gluconate 50 MG tablet Take 50 mg by mouth daily.     No current facility-administered medications for this visit.    PHYSICAL EXAMINATION: ECOG PERFORMANCE STATUS: {CHL ONC ECOG PS:[phone]}  There were no vitals filed for this visit. There were no vitals filed for this visit.  BREAST:*** No palpable masses or nodules in either right or left breasts. No palpable axillary supraclavicular or infraclavicular adenopathy no breast tenderness or nipple discharge. (exam performed in the presence of a chaperone)  LABORATORY DATA:  I have reviewed the data as listed    Latest Ref Rng & Units 04/09/2022    9:16 AM 04/08/2021   11:29 AM 04/04/2020    9:24 AM  CMP  Glucose 65 - 99 mg/dL 90  95  93   BUN 7 - 25 mg/dL 14  15  11    Creatinine 0.60 - 1.00 mg/dL 1.61  0.96  0.45   Sodium 135 - 146 mmol/L 140  139  140   Potassium 3.5 - 5.3 mmol/L 4.8  4.3  4.4   Chloride 98 - 110 mmol/L 104  102  104   CO2 20 - 32 mmol/L 26  29  26    Calcium 8.6 - 10.4 mg/dL 9.3  9.4  9.3   Total Protein 6.1 - 8.1 g/dL 6.9  7.0  6.8   Total Bilirubin 0.2 - 1.2  mg/dL 0.4  0.5  0.5   AST 10 - 35 U/L 20  20  19    ALT 6 - 29 U/L 16  17  14      Lab Results  Component Value Date   WBC 4.2 04/09/2022   HGB 13.3 04/09/2022   HCT 39.0 04/09/2022   MCV 91.5 04/09/2022   PLT 225 04/09/2022   NEUTROABS 2,495 04/09/2022    ASSESSMENT & PLAN:  No problem-specific Assessment & Plan notes found for this encounter.    No orders of the defined types were placed in this encounter.  The patient has a good understanding of the overall plan. she agrees with it. she will call with any problems that may develop before the next visit here. Total time spent: 30 mins including face to face time  and time spent for planning, charting and co-ordination of care   Sherlyn Lick, CMA 01/17/23    I Janan Ridge am acting as a Neurosurgeon for [employer]  ***

## 2023-01-18 ENCOUNTER — Inpatient Hospital Stay: Payer: Medicare HMO | Attending: Hematology and Oncology | Admitting: Hematology and Oncology

## 2023-01-18 VITALS — BP 141/85 | HR 91 | Temp 97.5°F | Resp 18 | Ht 62.25 in | Wt 126.5 lb

## 2023-01-18 DIAGNOSIS — Z90710 Acquired absence of both cervix and uterus: Secondary | ICD-10-CM | POA: Diagnosis not present

## 2023-01-18 DIAGNOSIS — Z170 Estrogen receptor positive status [ER+]: Secondary | ICD-10-CM | POA: Insufficient documentation

## 2023-01-18 DIAGNOSIS — Z90722 Acquired absence of ovaries, bilateral: Secondary | ICD-10-CM | POA: Diagnosis not present

## 2023-01-18 DIAGNOSIS — D0511 Intraductal carcinoma in situ of right breast: Secondary | ICD-10-CM | POA: Insufficient documentation

## 2023-01-18 DIAGNOSIS — Z9079 Acquired absence of other genital organ(s): Secondary | ICD-10-CM | POA: Insufficient documentation

## 2023-01-18 DIAGNOSIS — Z8542 Personal history of malignant neoplasm of other parts of uterus: Secondary | ICD-10-CM | POA: Diagnosis not present

## 2023-01-18 NOTE — Assessment & Plan Note (Addendum)
05/10/2019:Routine screening mammogram detected a 0.7cm group of calcifications in the upper outer right breast, a 0.5cm asymmetry in the outer right breast, and no axillary adenopathy. Biopsy DCIS with calcifications, intermediate to high grade, ER+ 90%, PR- 0%.   Recommendation: 1. Breast conserving surgery 05/30/2019:  Rt Lumpectomy: DCIS IG 0.9 cm margins neg, ER 90%, PR 0% 2. Followed by adjuvant radiation therapy 06/27/2019-07/21/2019 3. Followed by antiestrogen therapy with tamoxifen 5 years to start 07/31/2019 Genetics consult Neg  ------------------------------------------------------------------------------------------------------------------------------------- Tamoxifen toxicities: Occasional hot flashes Vaginal discharge: Persistent Body aches and pains  Based on multiple symptoms, I recommended discontinuation of tamoxifen at this time.   Breast Cancer Surveillance: 1. Breast Exam: 01/18/2023: Benign 2. Mammograms: 05/13/2022: [facility]: Benign breast density category B Bone density 05/30/2021: T score -1.8: Osteopenia   RTC on an as-needed basis.

## 2023-04-08 NOTE — Progress Notes (Addendum)
Annual Wellness Visit    Patient Care Team: Baxley, Luanna Cole, MD as PCP - General (Internal Medicine) Emelia Loron, MD as Consulting Physician (General Surgery) Serena Croissant, MD as Consulting Physician (Hematology and Oncology) Antony Blackbird, MD as Consulting Physician (Radiation Oncology)  Visit Date: 04/15/23   Chief Complaint  Patient presents with   Medicare Wellness   Annual Exam    Subjective:   Patient: Annette Tucker, Female    DOB: 1946-04-14, 77 y.o.   MRN: [id number]  Annette Tucker is a 77 y.o. Female who presents today for her Annual Wellness Visit. History of endometrial cancer, heart murmur, hyperlipidemia, hypertension.  History of hyperlipidemia treated with simvastatin 20 mg daily. Lipid panel normal.  History of hypertension treated with amlodipine 2.5 mg daily. Blood pressure normal in-office today at 130/88.  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  Biopsy showed DCIS with calcifications intermediate to high-grade.  Tumor was ER positive, PR negative.  She had genetic testing with no pathogenic variants identified.  She is being followed at the cancer center.  She had radiation therapy.  She had right lumpectomy May 30, 2019 by Dr. Dwain Sarna.  She is off of anastrozole, tamoxifen.    History of small retinal hemorrhage followed by Dr. Blima Ledger.   History of osteopenia and osteoarthritis.   Stress fracture right foot seen by Dr. Cleophas Dunker in March 2019.   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  B12 level has been checked previously.   Was diagnosed with mitral valve prolapse in 2011 on echocardiogram.   She takes vitamin D supplement.  She took Fosamax for couple of years for osteopenia.   Had tonsillectomy at age 65, TMJ mandibular advancement surgery 1986.     In 2015, she noted vaginal spotting.  She had a D&C and it revealed endometrial cancer.   She was referred to GYN oncologist.  Subsequently underwent robotic assisted TAH/BSO with lymph node dissection.  Lymph nodes were negative.  She had a high-grade serous endometrial carcinoma.  She received radiation treatment.  She has had chemotherapy with Taxol and carboplatin and is followed by GYN oncology.   Reports her eye exam and repeat bone density are scheduled for 11/24.  Denies GI, urinary problems.  Labs reviewed today. Glucose normal. Kidney, liver functions normal. Electrolytes normal. Blood proteins normal. CBC normal. TSH at 2.93.  Mammogram last completed 05/13/22. No mammographic evidence of malignancy. Stable right lumpectomy. Repeat recommended in 2024.  Colonoscopy September 2006 and in 2016.  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  Her job terminated at a [employer] when it was sold in 2009.  She is a native of [facility] [place] [facility] and is single.  Does not smoke or consume alcohol.   Family history: Mother with history of Alzheimer's dementia.  Father died at age 11 with kidney disease and had history of MI, hypertension and CVA.  1 brother with hyperlipidemia.  Past Medical History:  Diagnosis Date   Endometrial cancer (HCC)    2015   Family history of bone cancer    Family history of breast cancer    Heart murmur    as child, no problems    Hyperlipidemia    Hypertension    Radiation 08/21/14, 08/28/14, 09/04/14, 09/06/14, 09/18/14   vaginal cuff brachytherapy 30 gray   Seasonal allergies  Family History  Problem Relation Age of Onset   Mental retardation Mother    Kidney disease Father    Stroke Father    Cancer Maternal Uncle        unknown   Breast cancer Maternal Aunt        dx 46s   Bone cancer Paternal Uncle    Breast cancer Cousin        pat cousins x2, dx in their 34s   Bone cancer Maternal Uncle      Social Hx: Retired.  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  She is a native of a [place] town [address] and is single.  Does not smoke or consume alcohol.    Review of Systems  Constitutional:  Negative for chills, fever, malaise/fatigue and weight loss.  HENT:  Negative for hearing loss, sinus pain and sore throat.   Respiratory:  Negative for cough, hemoptysis and shortness of breath.   Cardiovascular:  Negative for chest pain, palpitations, leg swelling and PND.  Gastrointestinal:  Negative for abdominal pain, constipation, diarrhea, heartburn, nausea and vomiting.  Genitourinary:  Negative for dysuria, frequency and urgency.  Musculoskeletal:  Negative for back pain, myalgias and neck pain.  Skin:  Negative for itching and rash.  Neurological:  Positive for tingling (Legs). Negative for dizziness, seizures and headaches.  Endo/Heme/Allergies:  Negative for polydipsia.  Psychiatric/Behavioral:  Negative for depression. The patient is not nervous/anxious.       Objective:   Vitals: BP 130/88   Pulse 97   Ht 5' 2.25" (1.581 m)   Wt 127 lb (57.6 kg)   SpO2 97%   BMI 23.04 kg/m   Physical Exam Vitals and nursing note reviewed.  Constitutional:      General: She is not in acute distress.    Appearance: Normal appearance. She is not ill-appearing or toxic-appearing.  HENT:     Head: Normocephalic and atraumatic.     Right Ear: Hearing, tympanic membrane, ear canal and external ear normal.     Left Ear: Hearing, tympanic membrane, ear canal and external ear normal.     Mouth/Throat:     Pharynx: Oropharynx is clear.  Eyes:     Extraocular Movements: Extraocular movements intact.     Pupils: Pupils are equal, round, and reactive to light.  Neck:     Thyroid: No thyroid mass, thyromegaly or thyroid tenderness.     Vascular: No carotid bruit.  Cardiovascular:     Rate and Rhythm: Normal rate and regular rhythm. No extrasystoles are present.    Pulses:           Dorsalis pedis pulses are 2+ on the right side and 2+ on the left side.       Posterior tibial pulses are 2+ on the right side and 2+ on the left side.     Heart sounds: Normal heart sounds. No murmur heard.    No friction rub. No gallop.  Pulmonary:     Effort: Pulmonary effort is normal.     Breath sounds: Normal breath sounds. No decreased breath sounds, wheezing, rhonchi or rales.  Chest:     Chest wall: No mass.  Breasts:    Right: No mass.     Left: No mass.  Abdominal:     Palpations: Abdomen is soft. There is no hepatomegaly, splenomegaly or mass.  Tenderness: There is no abdominal tenderness.     Hernia: No hernia is present.  Musculoskeletal:     Cervical back: Normal range of motion.     Right lower leg: No edema.     Left lower leg: No edema.  Lymphadenopathy:     Cervical: No cervical adenopathy.     Upper Body:     Right upper body: No supraclavicular adenopathy.     Left upper body: No supraclavicular adenopathy.  Skin:    General: Skin is warm and dry.  Neurological:     General: No focal deficit present.     Mental Status: She is alert and oriented to person, place, and time. Mental status is at baseline.     Sensory: Sensation is intact.     Motor: Motor function is intact. No weakness.     Deep Tendon Reflexes: Reflexes are normal and symmetric.  Psychiatric:        Attention and Perception: Attention normal.        Mood and Affect: Mood normal.        Speech: Speech normal.        Behavior: Behavior normal.        Thought Content: Thought content normal.        Cognition and Memory: Cognition normal.        Judgment: Judgment normal.      Most recent functional status assessment:    04/15/2023    9:38 AM  In your present state of health, do you have any difficulty performing the following activities:  Hearing? 0  Vision? 0  Difficulty concentrating or making decisions? 0  Walking or climbing stairs? 0  Dressing or bathing? 0  Doing  errands, shopping? 0  Preparing Food and eating ? N  Using the Toilet? N  In the past six months, have you accidently leaked urine? N  Do you have problems with loss of bowel control? N  Managing your Medications? N  Managing your Finances? N  Housekeeping or managing your Housekeeping? N   Most recent fall risk assessment:    04/15/2023    9:45 AM  Fall Risk   Falls in the past year? 0  Number falls in past yr: 0  Injury with Fall? 0  Risk for fall due to : No Fall Risks  Follow up Falls evaluation completed    Most recent depression screenings:    10/16/2022   10:41 AM 04/13/2022    9:53 AM  PHQ 2/9 Scores  PHQ - 2 Score 0 0   Most recent cognitive screening:    04/15/2023    9:45 AM  6CIT Screen  What Year? 0 points  What month? 0 points  What time? 0 points  Count back from 20 0 points  Months in reverse 0 points  Repeat phrase 0 points  Total Score 0 points     Results:   Studies obtained and personally reviewed by me:  Mammogram last completed 05/13/22. No mammographic evidence of malignancy. Stable right lumpectomy. Repeat recommended in 2024.  Colonoscopy September 2006 and in 2016.  Labs:       Component Value Date/Time   NA 142 04/13/2023 1120   NA 141 07/24/2015 1412   K 4.9 04/13/2023 1120   K 3.8 07/24/2015 1412   CL 103 04/13/2023 1120   CO2 28 04/13/2023 1120   CO2 28 07/24/2015 1412   GLUCOSE 97 04/13/2023 1120   GLUCOSE 110 07/24/2015 1412  BUN 12 04/13/2023 1120   BUN 18.0 07/24/2015 1412   CREATININE 0.75 04/13/2023 1120   CREATININE 0.8 07/24/2015 1412   CALCIUM 9.8 04/13/2023 1120   CALCIUM 9.7 07/24/2015 1412   PROT 7.4 04/13/2023 1120   PROT 7.5 07/24/2015 1412   ALBUMIN 4.5 05/17/2019 0839   ALBUMIN 4.1 07/24/2015 1412   AST 27 04/13/2023 1120   AST 18 05/17/2019 0839   AST 19 07/24/2015 1412   ALT 26 04/13/2023 1120   ALT 17 05/17/2019 0839   ALT 15 07/24/2015 1412   ALKPHOS 76 05/17/2019 0839   ALKPHOS 83  07/24/2015 1412   BILITOT 0.6 04/13/2023 1120   BILITOT 0.4 05/17/2019 0839   BILITOT <0.30 07/24/2015 1412   GFRNONAA 78 04/04/2020 0924   GFRAA 90 04/04/2020 0924     Lab Results  Component Value Date   WBC 4.8 04/13/2023   HGB 14.3 04/13/2023   HCT 44.1 04/13/2023   MCV 93.0 04/13/2023   PLT 256 04/13/2023    Lab Results  Component Value Date   CHOL 191 04/13/2023   HDL 80 04/13/2023   LDLCALC 93 04/13/2023   TRIG 85 04/13/2023   CHOLHDL 2.4 04/13/2023    No results found for: "HGBA1C"   Lab Results  Component Value Date   TSH 2.93 04/13/2023    Assessment & Plan:   Hyperlipidemia: treated with simvastatin 20 mg daily. Lipid panel normal. Refilled.  Hypertension: treated with amlodipine 2.5 mg daily. Blood pressure normal at 130/88. Refilled amlodipine.  DCIS had lumpectomy December 2021 and 21 radiation treatments followed at the cancer center. She is off of anastrozole, tamoxifen.   Family history of MI in father who also had CVA and hypertension.  She is take statin medication.   History of peripheral neuropathy which started several years after chemotherapy for endometrial cancer.  B12 level has been checked and was normal.  History of endometrial cancer followed by GYN oncology.  History of osteopenia and osteoarthritis.  Urinalysis normal today.  Pelvic exam deferred to GYN physician.  Mammogram last completed 05/13/22. No mammographic evidence of malignancy. Stable right lumpectomy. Repeat recommended in 2024.  Colonoscopy September 2006 and in 2016.  Vaccine counseling: UTD on tetanus, shingles, pneumococcal 20 vaccines. She will go to the pharmacy for the high-dose flu, Covid-19 boosters.  Return in 6 months for TSH recheck or as needed.     Annual wellness visit done today including the all of the following: Reviewed patient's Family Medical History Reviewed and updated list of patient's medical providers Assessment of cognitive  impairment was done Assessed patient's functional ability Established a written schedule for health screening services Health Risk Assessent Completed and Reviewed  Discussed health benefits of physical activity, and encouraged her to engage in regular exercise appropriate for her age and condition.        I,Alexander Ruley,acting as a Neurosurgeon for Margaree Mackintosh, MD.,have documented all relevant documentation on the behalf of Margaree Mackintosh, MD,as directed by  Margaree Mackintosh, MD while in the presence of Margaree Mackintosh, MD.   I, Margaree Mackintosh, MD, have reviewed all documentation for this visit. The documentation on 04/25/23 for the exam, diagnosis, procedures, and orders are all accurate and complete.

## 2023-04-13 ENCOUNTER — Other Ambulatory Visit: Payer: Medicare HMO

## 2023-04-13 DIAGNOSIS — I10 Essential (primary) hypertension: Secondary | ICD-10-CM

## 2023-04-13 DIAGNOSIS — D0511 Intraductal carcinoma in situ of right breast: Secondary | ICD-10-CM

## 2023-04-13 DIAGNOSIS — Z0000 Encounter for general adult medical examination without abnormal findings: Secondary | ICD-10-CM | POA: Diagnosis not present

## 2023-04-13 DIAGNOSIS — Z1329 Encounter for screening for other suspected endocrine disorder: Secondary | ICD-10-CM

## 2023-04-13 DIAGNOSIS — Z13220 Encounter for screening for lipoid disorders: Secondary | ICD-10-CM | POA: Diagnosis not present

## 2023-04-13 DIAGNOSIS — M8580 Other specified disorders of bone density and structure, unspecified site: Secondary | ICD-10-CM | POA: Diagnosis not present

## 2023-04-14 LAB — COMPLETE METABOLIC PANEL WITH GFR
AG Ratio: 1.6 (calc) (ref 1.0–2.5)
ALT: 26 U/L (ref 6–29)
AST: 27 U/L (ref 10–35)
Albumin: 4.6 g/dL (ref 3.6–5.1)
Alkaline phosphatase (APISO): 74 U/L (ref 37–153)
BUN: 12 mg/dL (ref 7–25)
CO2: 28 mmol/L (ref 20–32)
Calcium: 9.8 mg/dL (ref 8.6–10.4)
Chloride: 103 mmol/L (ref 98–110)
Creat: 0.75 mg/dL (ref 0.60–1.00)
Globulin: 2.8 g/dL (ref 1.9–3.7)
Glucose, Bld: 97 mg/dL (ref 65–99)
Potassium: 4.9 mmol/L (ref 3.5–5.3)
Sodium: 142 mmol/L (ref 135–146)
Total Bilirubin: 0.6 mg/dL (ref 0.2–1.2)
Total Protein: 7.4 g/dL (ref 6.1–8.1)
eGFR: 83 mL/min/{1.73_m2} (ref >=60)

## 2023-04-14 LAB — CBC WITH DIFFERENTIAL/PLATELET
Absolute Lymphocytes: 1085 {cells}/uL (ref 850–3900)
Absolute Monocytes: 504 {cells}/uL (ref 200–950)
Basophils Absolute: 38 {cells}/uL (ref 0–200)
Basophils Relative: 0.8 %
Eosinophils Absolute: 101 {cells}/uL (ref 15–500)
Eosinophils Relative: 2.1 %
HCT: 44.1 % (ref 35.0–45.0)
Hemoglobin: 14.3 g/dL (ref 11.7–15.5)
MCH: 30.2 pg (ref 27.0–33.0)
MCHC: 32.4 g/dL (ref 32.0–36.0)
MCV: 93 fL (ref 80.0–100.0)
MPV: 11 fL (ref 7.5–12.5)
Monocytes Relative: 10.5 %
Neutro Abs: 3072 {cells}/uL (ref 1500–7800)
Neutrophils Relative %: 64 %
Platelets: 256 10*3/uL (ref 140–400)
RBC: 4.74 10*6/uL (ref 3.80–5.10)
RDW: 12.8 % (ref 11.0–15.0)
Total Lymphocyte: 22.6 %
WBC: 4.8 10*3/uL (ref 3.8–10.8)

## 2023-04-14 LAB — LIPID PANEL
Cholesterol: 191 mg/dL (ref <200)
HDL: 80 mg/dL (ref >=50)
LDL Cholesterol (Calc): 93 mg/dL
Non-HDL Cholesterol (Calc): 111 mg/dL (ref <130)
Total CHOL/HDL Ratio: 2.4 (calc) (ref <5.0)
Triglycerides: 85 mg/dL (ref <150)

## 2023-04-14 LAB — TSH: TSH: 2.93 m[IU]/L (ref 0.40–4.50)

## 2023-04-15 ENCOUNTER — Encounter: Payer: Self-pay | Admitting: Internal Medicine

## 2023-04-15 ENCOUNTER — Ambulatory Visit (INDEPENDENT_AMBULATORY_CARE_PROVIDER_SITE_OTHER): Payer: Medicare HMO | Admitting: Internal Medicine

## 2023-04-15 VITALS — BP 130/88 | HR 97 | Ht 62.25 in | Wt 127.0 lb

## 2023-04-15 DIAGNOSIS — M8580 Other specified disorders of bone density and structure, unspecified site: Secondary | ICD-10-CM | POA: Diagnosis not present

## 2023-04-15 DIAGNOSIS — Z13820 Encounter for screening for osteoporosis: Secondary | ICD-10-CM

## 2023-04-15 DIAGNOSIS — I10 Essential (primary) hypertension: Secondary | ICD-10-CM

## 2023-04-15 DIAGNOSIS — Z0000 Encounter for general adult medical examination without abnormal findings: Secondary | ICD-10-CM

## 2023-04-15 DIAGNOSIS — G620 Drug-induced polyneuropathy: Secondary | ICD-10-CM

## 2023-04-15 DIAGNOSIS — Z823 Family history of stroke: Secondary | ICD-10-CM

## 2023-04-15 DIAGNOSIS — Z86000 Personal history of in-situ neoplasm of breast: Secondary | ICD-10-CM

## 2023-04-15 DIAGNOSIS — Z8542 Personal history of malignant neoplasm of other parts of uterus: Secondary | ICD-10-CM

## 2023-04-15 LAB — POCT URINALYSIS DIP (CLINITEK)
Bilirubin, UA: NEGATIVE
Blood, UA: NEGATIVE
Glucose, UA: NEGATIVE mg/dL
Ketones, POC UA: NEGATIVE mg/dL
Leukocytes, UA: NEGATIVE
Nitrite, UA: NEGATIVE
POC PROTEIN,UA: NEGATIVE
Spec Grav, UA: 1.01 (ref 1.010–1.025)
Urobilinogen, UA: 0.2 U/dL
pH, UA: 6 (ref 5.0–8.0)

## 2023-04-15 MED ORDER — SIMVASTATIN 20 MG PO TABS: ORAL_TABLET | ORAL | 3 refills | Status: DC

## 2023-04-15 MED ORDER — AMLODIPINE BESYLATE 2.5 MG PO TABS: 2.5000 mg | ORAL_TABLET | Freq: Every day | ORAL | 1 refills | Status: DC

## 2023-04-25 NOTE — Patient Instructions (Addendum)
As always, it was a pleasure to see you today.  Continue current medications and return in 1 year or as needed.  Mammogram due in November.  Vaccines discussed.  Return in 6 months for follow-up on hypothyroidism or as needed.

## 2023-04-25 NOTE — Progress Notes (Incomplete)
Annual Wellness Visit    Patient Care Team: Cope Marte, Luanna Cole, MD as PCP - General (Internal Medicine) Emelia Loron, MD as Consulting Physician (General Surgery) Serena Croissant, MD as Consulting Physician (Hematology and Oncology) Antony Blackbird, MD as Consulting Physician (Radiation Oncology)  Visit Date: 04/15/23   Chief Complaint  Patient presents with  . Medicare Wellness  . Annual Exam    Subjective:   Patient: Annette Tucker, Female    DOB: [birth year], 77 y.o.   MRN: [id number]  Annette Tucker is a 77 y.o. Female who presents today for her Annual Wellness Visit. History of endometrial cancer, heart murmur, hyperlipidemia, hypertension.  History of hyperlipidemia treated with simvastatin 20 mg daily. Lipid panel normal.  History of hypertension treated with amlodipine 2.5 mg daily. Blood pressure normal in-office today at 130/88.  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  Biopsy showed DCIS with calcifications intermediate to high-grade.  Tumor was ER positive, PR negative.  She had genetic testing with no pathogenic variants identified.  She is being followed at the cancer center.  She had radiation therapy.  She had right lumpectomy May 30, 2019 by Dr. Dwain Sarna.  She is off of anastrozole, tamoxifen.    History of small retinal hemorrhage followed by Dr. Blima Ledger.   History of osteopenia and osteoarthritis.   Stress fracture right foot seen by Dr. Cleophas Dunker in March 2019.   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  B12 level has been checked previously.   Was diagnosed with mitral valve prolapse in 2011 on echocardiogram.   She takes vitamin D supplement.  She took Fosamax for couple of years for osteopenia.   Had tonsillectomy at age 6, TMJ mandibular advancement surgery 1986.     In 2015, she noted vaginal spotting.  She had a D&C and it revealed endometrial cancer.   She was referred to GYN oncologist.  Subsequently underwent robotic assisted TAH/BSO with lymph node dissection.  Lymph nodes were negative.  She had a high-grade serous endometrial carcinoma.  She received radiation treatment.  She has had chemotherapy with Taxol and carboplatin and is followed by GYN oncology.   Reports her eye exam and repeat bone density are scheduled for 11/24.  Denies GI, urinary problems.  Labs reviewed today. Glucose normal. Kidney, liver functions normal. Electrolytes normal. Blood proteins normal. CBC normal. TSH at 2.93.  Mammogram last completed 05/13/22. No mammographic evidence of malignancy. Stable right lumpectomy. Repeat recommended in 2024.  Colonoscopy September 2006 and in 2016.  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  Her job terminated at a [employer] when it was sold in 2009.  She is a native of [place] and is single.  Does not smoke or consume alcohol.   Family history: Mother with history of Alzheimer's dementia.  Father died at age 33 with kidney disease and had history of MI, hypertension and CVA.  1 brother with hyperlipidemia.  Past Medical History:  Diagnosis Date  . Endometrial cancer (HCC)    2015  . Family history of bone cancer   . Family history of breast cancer   . Heart murmur    as child, no problems   . Hyperlipidemia   . Hypertension   . Radiation 08/21/14, 08/28/14, 09/04/14, 09/06/14, 09/18/14   vaginal cuff brachytherapy 30 gray  . Seasonal allergies  Family History  Problem Relation Age of Onset  . Mental retardation Mother   . Kidney disease Father   . Stroke Father   . Cancer Maternal Uncle        unknown  . Breast cancer Maternal Aunt        dx 65s  . Bone cancer Paternal Uncle   . Breast cancer Cousin        pat cousins x2, dx in their 31s  . Bone cancer Maternal Uncle      Social Hx: Retired.    Review of Systems  Constitutional:   Negative for chills, fever, malaise/fatigue and weight loss.  HENT:  Negative for hearing loss, sinus pain and sore throat.   Respiratory:  Negative for cough, hemoptysis and shortness of breath.   Cardiovascular:  Negative for chest pain, palpitations, leg swelling and PND.  Gastrointestinal:  Negative for abdominal pain, constipation, diarrhea, heartburn, nausea and vomiting.  Genitourinary:  Negative for dysuria, frequency and urgency.  Musculoskeletal:  Negative for back pain, myalgias and neck pain.  Skin:  Negative for itching and rash.  Neurological:  Positive for tingling (Legs). Negative for dizziness, seizures and headaches.  Endo/Heme/Allergies:  Negative for polydipsia.  Psychiatric/Behavioral:  Negative for depression. The patient is not nervous/anxious.       Objective:   Vitals: BP 130/88   Pulse 97   Ht 5' 2.25" (1.581 m)   Wt 127 lb (57.6 kg)   SpO2 97%   BMI 23.04 kg/m   Physical Exam Vitals and nursing note reviewed.  Constitutional:      General: She is not in acute distress.    Appearance: Normal appearance. She is not ill-appearing or toxic-appearing.  HENT:     Head: Normocephalic and atraumatic.     Right Ear: Hearing, tympanic membrane, ear canal and external ear normal.     Left Ear: Hearing, tympanic membrane, ear canal and external ear normal.     Mouth/Throat:     Pharynx: Oropharynx is clear.  Eyes:     Extraocular Movements: Extraocular movements intact.     Pupils: Pupils are equal, round, and reactive to light.  Neck:     Thyroid: No thyroid mass, thyromegaly or thyroid tenderness.     Vascular: No carotid bruit.  Cardiovascular:     Rate and Rhythm: Normal rate and regular rhythm. No extrasystoles are present.    Pulses:          Dorsalis pedis pulses are 2+ on the right side and 2+ on the left side.       Posterior tibial pulses are 2+ on the right side and 2+ on the left side.     Heart sounds: Normal heart sounds. No murmur heard.     No friction rub. No gallop.  Pulmonary:     Effort: Pulmonary effort is normal.     Breath sounds: Normal breath sounds. No decreased breath sounds, wheezing, rhonchi or rales.  Chest:     Chest wall: No mass.  Breasts:    Right: No mass.     Left: No mass.  Abdominal:     Palpations: Abdomen is soft. There is no hepatomegaly, splenomegaly or mass.     Tenderness: There is no abdominal tenderness.     Hernia: No hernia is present.  Musculoskeletal:     Cervical back: Normal range of motion.     Right lower leg: No edema.     Left lower leg: No edema.  Lymphadenopathy:     Cervical: No cervical adenopathy.     Upper Body:     Right upper body: No supraclavicular adenopathy.     Left upper body: No supraclavicular adenopathy.  Skin:    General: Skin is warm and dry.  Neurological:     General: No focal deficit present.     Mental Status: She is alert and oriented to person, place, and time. Mental status is at baseline.     Sensory: Sensation is intact.     Motor: Motor function is intact. No weakness.     Deep Tendon Reflexes: Reflexes are normal and symmetric.  Psychiatric:        Attention and Perception: Attention normal.        Mood and Affect: Mood normal.        Speech: Speech normal.        Behavior: Behavior normal.        Thought Content: Thought content normal.        Cognition and Memory: Cognition normal.        Judgment: Judgment normal.      Most recent functional status assessment:    04/15/2023    9:38 AM  In your present state of health, do you have any difficulty performing the following activities:  Hearing? 0  Vision? 0  Difficulty concentrating or making decisions? 0  Walking or climbing stairs? 0  Dressing or bathing? 0  Doing errands, shopping? 0  Preparing Food and eating ? N  Using the Toilet? N  In the past six months, have you accidently leaked urine? N  Do you have problems with loss of bowel control? N  Managing your Medications? N   Managing your Finances? N  Housekeeping or managing your Housekeeping? N   Most recent fall risk assessment:    04/15/2023    9:45 AM  Fall Risk   Falls in the past year? 0  Number falls in past yr: 0  Injury with Fall? 0  Risk for fall due to : No Fall Risks  Follow up Falls evaluation completed    Most recent depression screenings:    10/16/2022   10:41 AM 04/13/2022    9:53 AM  PHQ 2/9 Scores  PHQ - 2 Score 0 0   Most recent cognitive screening:    04/15/2023    9:45 AM  6CIT Screen  What Year? 0 points  What month? 0 points  What time? 0 points  Count back from 20 0 points  Months in reverse 0 points  Repeat phrase 0 points  Total Score 0 points     Results:   Studies obtained and personally reviewed by me:  Mammogram last completed 05/13/22. No mammographic evidence of malignancy. Stable right lumpectomy. Repeat recommended in 2024.  Colonoscopy September 2006 and in 2016.  Labs:       Component Value Date/Time   NA 142 04/13/2023 1120   NA 141 07/24/2015 1412   K 4.9 04/13/2023 1120   K 3.8 07/24/2015 1412   CL 103 04/13/2023 1120   CO2 28 04/13/2023 1120   CO2 28 07/24/2015 1412   GLUCOSE 97 04/13/2023 1120   GLUCOSE 110 07/24/2015 1412   BUN 12 04/13/2023 1120   BUN 18.0 07/24/2015 1412   CREATININE 0.75 04/13/2023 1120   CREATININE 0.8 07/24/2015 1412   CALCIUM 9.8 04/13/2023 1120   CALCIUM 9.7 07/24/2015 1412   PROT 7.4 04/13/2023 1120   PROT 7.5 07/24/2015 1412  ALBUMIN 4.5 05/17/2019 0839   ALBUMIN 4.1 07/24/2015 1412   AST 27 04/13/2023 1120   AST 18 05/17/2019 0839   AST 19 07/24/2015 1412   ALT 26 04/13/2023 1120   ALT 17 05/17/2019 0839   ALT 15 07/24/2015 1412   ALKPHOS 76 05/17/2019 0839   ALKPHOS 83 07/24/2015 1412   BILITOT 0.6 04/13/2023 1120   BILITOT 0.4 05/17/2019 0839   BILITOT <0.30 07/24/2015 1412   GFRNONAA 78 04/04/2020 0924   GFRAA 90 04/04/2020 0924     Lab Results  Component Value Date   WBC 4.8  04/13/2023   HGB 14.3 04/13/2023   HCT 44.1 04/13/2023   MCV 93.0 04/13/2023   PLT 256 04/13/2023    Lab Results  Component Value Date   CHOL 191 04/13/2023   HDL 80 04/13/2023   LDLCALC 93 04/13/2023   TRIG 85 04/13/2023   CHOLHDL 2.4 04/13/2023    No results found for: "HGBA1C"   Lab Results  Component Value Date   TSH 2.93 04/13/2023    Assessment & Plan:   Hyperlipidemia: treated with simvastatin 20 mg daily. Lipid panel normal. Refilled.  Hypertension: treated with amlodipine 2.5 mg daily. Blood pressure normal at 130/88. Refilled amlodipine.  DCIS had lumpectomy December 2021 and 21 radiation treatments followed at the cancer center. She is off of anastrozole, tamoxifen.    History of peripheral neuropathy which started several years after chemotherapy for endometrial cancer.  B12 level has been checked and was normal.  History of endometrial cancer followed by GYN oncology.  History of osteopenia and osteoarthritis.  Urinalysis normal today.  Pelvic exam deferred to GYN physician.  Mammogram last completed 05/13/22. No mammographic evidence of malignancy. Stable right lumpectomy. Repeat recommended in 2024.  Colonoscopy September 2006 and in 2016.  Vaccine counseling: UTD on tetanus, shingles, pneumococcal 20 vaccines. She will go to the pharmacy for the high-dose flu, Covid-19 boosters.  Return in 6 months for TSH recheck or as needed.     Annual wellness visit done today including the all of the following: Reviewed patient's Family Medical History Reviewed and updated list of patient's medical providers Assessment of cognitive impairment was done Assessed patient's functional ability Established a written schedule for health screening services Health Risk Assessent Completed and Reviewed  Discussed health benefits of physical activity, and encouraged her to engage in regular exercise appropriate for her age and condition.        I,Alexander  Ruley,acting as a Neurosurgeon for Margaree Mackintosh, MD.,have documented all relevant documentation on the behalf of Margaree Mackintosh, MD,as directed by  Margaree Mackintosh, MD while in the presence of Margaree Mackintosh, MD.   ***

## 2023-05-06 DIAGNOSIS — H524 Presbyopia: Secondary | ICD-10-CM | POA: Diagnosis not present

## 2023-05-11 DIAGNOSIS — Z0100 Encounter for examination of eyes and vision without abnormal findings: Secondary | ICD-10-CM | POA: Diagnosis not present

## 2023-05-17 DIAGNOSIS — Z1231 Encounter for screening mammogram for malignant neoplasm of breast: Secondary | ICD-10-CM | POA: Diagnosis not present

## 2023-05-17 DIAGNOSIS — M8588 Other specified disorders of bone density and structure, other site: Secondary | ICD-10-CM | POA: Diagnosis not present

## 2023-05-17 LAB — HM DEXA SCAN

## 2023-05-26 DIAGNOSIS — R922 Inconclusive mammogram: Secondary | ICD-10-CM | POA: Diagnosis not present

## 2023-05-26 LAB — HM MAMMOGRAPHY

## 2023-06-09 ENCOUNTER — Encounter: Payer: Self-pay | Admitting: Internal Medicine

## 2023-06-11 NOTE — Progress Notes (Signed)
Called and scheduled appointment.

## 2023-06-16 NOTE — Progress Notes (Signed)
Patient Care Team: Margaree Mackintosh, MD as PCP - General (Internal Medicine) Emelia Loron, MD as Consulting Physician (General Surgery) Serena Croissant, MD as Consulting Physician (Hematology and Oncology) Antony Blackbird, MD as Consulting Physician (Radiation Oncology)  Visit Date: 06/18/23  Subjective:    Patient ID: Annette Tucker , Female   DOB: 16-May-1946, 77 y.o.    MRN: [id number]   77 y.o. Female presents today to discuss most recent bone density scan. 05/17/23 bone density scan with T-score of -2.1 at bilateral hips. T-score was -1.8 in 2022. She is no longer taking tamoxifen. Taking 1200 mg calcium, 2000 units Vitamin D daily.  Past Medical History:  Diagnosis Date   Endometrial cancer (HCC)    2015   Family history of bone cancer    Family history of breast cancer    Heart murmur    as child, no problems    Hyperlipidemia    Hypertension    Radiation 08/21/14, 08/28/14, 09/04/14, 09/06/14, 09/18/14   vaginal cuff brachytherapy 30 gray   Seasonal allergies      Family History  Problem Relation Age of Onset   Mental retardation Mother    Kidney disease Father    Stroke Father    Cancer Maternal Uncle        unknown   Breast cancer Maternal Aunt        dx 23s   Bone cancer Paternal Uncle    Breast cancer Cousin        pat cousins x2, dx in their 63s   Bone cancer Maternal Uncle     Social History   Social History Narrative   Not on file      Review of Systems  Constitutional:  Negative for fever and malaise/fatigue.  HENT:  Negative for congestion.   Eyes:  Negative for blurred vision.  Respiratory:  Negative for cough and shortness of breath.   Cardiovascular:  Negative for chest pain, palpitations and leg swelling.  Gastrointestinal:  Negative for vomiting.  Musculoskeletal:  Negative for back pain.  Skin:  Negative for rash.  Neurological:  Negative for loss of consciousness and headaches.        Objective:   Vitals: BP 120/80   Pulse  86   Ht 5' 2.25" (1.581 m)   Wt 125 lb (56.7 kg)   SpO2 98%   BMI 22.68 kg/m    Physical Exam Vitals and nursing note reviewed.  Constitutional:      General: She is not in acute distress.    Appearance: Normal appearance. She is not toxic-appearing.  HENT:     Head: Normocephalic and atraumatic.  Pulmonary:     Effort: Pulmonary effort is normal.  Skin:    General: Skin is warm and dry.  Neurological:     Mental Status: She is alert and oriented to person, place, and time. Mental status is at baseline.  Psychiatric:        Mood and Affect: Mood normal.        Behavior: Behavior normal.        Thought Content: Thought content normal.        Judgment: Judgment normal.       Results:   Studies obtained and personally reviewed by me:  05/17/23 bone density scan with T-score of -2.1 at bilateral hips.   T-score was -1.8 in 2022.   05/26/23 mammogram normal.  Labs:       Component Value Date/Time   NA  142 04/13/2023 1120   NA 141 07/24/2015 1412   K 4.9 04/13/2023 1120   K 3.8 07/24/2015 1412   CL 103 04/13/2023 1120   CO2 28 04/13/2023 1120   CO2 28 07/24/2015 1412   GLUCOSE 97 04/13/2023 1120   GLUCOSE 110 07/24/2015 1412   BUN 12 04/13/2023 1120   BUN 18.0 07/24/2015 1412   CREATININE 0.75 04/13/2023 1120   CREATININE 0.8 07/24/2015 1412   CALCIUM 9.8 04/13/2023 1120   CALCIUM 9.7 07/24/2015 1412   PROT 7.4 04/13/2023 1120   PROT 7.5 07/24/2015 1412   ALBUMIN 4.5 05/17/2019 0839   ALBUMIN 4.1 07/24/2015 1412   AST 27 04/13/2023 1120   AST 18 05/17/2019 0839   AST 19 07/24/2015 1412   ALT 26 04/13/2023 1120   ALT 17 05/17/2019 0839   ALT 15 07/24/2015 1412   ALKPHOS 76 05/17/2019 0839   ALKPHOS 83 07/24/2015 1412   BILITOT 0.6 04/13/2023 1120   BILITOT 0.4 05/17/2019 0839   BILITOT <0.30 07/24/2015 1412   GFRNONAA 78 04/04/2020 0924   GFRAA 90 04/04/2020 0924     Lab Results  Component Value Date   WBC 4.8 04/13/2023   HGB 14.3 04/13/2023    HCT 44.1 04/13/2023   MCV 93.0 04/13/2023   PLT 256 04/13/2023    Lab Results  Component Value Date   CHOL 191 04/13/2023   HDL 80 04/13/2023   LDLCALC 93 04/13/2023   TRIG 85 04/13/2023   CHOLHDL 2.4 04/13/2023    No results found for: "HGBA1C"   Lab Results  Component Value Date   TSH 2.93 04/13/2023      Assessment & Plan:   Osteopenia: continue to monitor. T-score at -2.1.     I,Alexander Ruley,acting as a Neurosurgeon for Margaree Mackintosh, MD.,have documented all relevant documentation on the behalf of Margaree Mackintosh, MD,as directed by  Margaree Mackintosh, MD while in the presence of Margaree Mackintosh, MD.   I, Margaree Mackintosh, MD, have reviewed all documentation for this visit. The documentation on 06/29/23 for the exam, diagnosis, procedures, and orders are all accurate and complete.

## 2023-06-18 ENCOUNTER — Ambulatory Visit (INDEPENDENT_AMBULATORY_CARE_PROVIDER_SITE_OTHER): Payer: Medicare HMO | Admitting: Internal Medicine

## 2023-06-18 ENCOUNTER — Encounter: Payer: Self-pay | Admitting: Internal Medicine

## 2023-06-18 VITALS — BP 120/80 | HR 86 | Ht 62.25 in | Wt 125.0 lb

## 2023-06-18 DIAGNOSIS — M8580 Other specified disorders of bone density and structure, unspecified site: Secondary | ICD-10-CM

## 2023-06-29 ENCOUNTER — Encounter: Payer: Self-pay | Admitting: Internal Medicine

## 2023-06-29 NOTE — Patient Instructions (Signed)
Osteopenia and option for treatment discussed.  Patient prefers no treatment at this time other than calcium supplementation and weightbearing exercise.  Has follow-up for October 2025.  T-score is -2.1.

## 2023-11-29 DIAGNOSIS — R350 Frequency of micturition: Secondary | ICD-10-CM | POA: Diagnosis not present

## 2023-11-29 DIAGNOSIS — R87619 Unspecified abnormal cytological findings in specimens from cervix uteri: Secondary | ICD-10-CM | POA: Diagnosis not present

## 2024-01-07 ENCOUNTER — Other Ambulatory Visit: Payer: Self-pay

## 2024-01-07 MED ORDER — AMLODIPINE BESYLATE 2.5 MG PO TABS: 2.5000 mg | ORAL_TABLET | Freq: Every day | ORAL | 3 refills | Status: AC

## 2024-02-03 DIAGNOSIS — L570 Actinic keratosis: Secondary | ICD-10-CM | POA: Diagnosis not present

## 2024-02-03 DIAGNOSIS — L820 Inflamed seborrheic keratosis: Secondary | ICD-10-CM | POA: Diagnosis not present

## 2024-02-03 DIAGNOSIS — D2271 Melanocytic nevi of right lower limb, including hip: Secondary | ICD-10-CM | POA: Diagnosis not present

## 2024-02-03 DIAGNOSIS — Z85828 Personal history of other malignant neoplasm of skin: Secondary | ICD-10-CM | POA: Diagnosis not present

## 2024-02-03 DIAGNOSIS — L578 Other skin changes due to chronic exposure to nonionizing radiation: Secondary | ICD-10-CM | POA: Diagnosis not present

## 2024-02-03 DIAGNOSIS — D225 Melanocytic nevi of trunk: Secondary | ICD-10-CM | POA: Diagnosis not present

## 2024-02-03 DIAGNOSIS — L821 Other seborrheic keratosis: Secondary | ICD-10-CM | POA: Diagnosis not present

## 2024-02-03 DIAGNOSIS — D2371 Other benign neoplasm of skin of right lower limb, including hip: Secondary | ICD-10-CM | POA: Diagnosis not present

## 2024-02-03 DIAGNOSIS — L814 Other melanin hyperpigmentation: Secondary | ICD-10-CM | POA: Diagnosis not present

## 2024-04-06 ENCOUNTER — Other Ambulatory Visit: Payer: Self-pay | Admitting: Internal Medicine

## 2024-04-06 MED ORDER — SIMVASTATIN 20 MG PO TABS: ORAL_TABLET | ORAL | 3 refills | Status: AC

## 2024-04-06 NOTE — Telephone Encounter (Signed)
 Copied from CRM [id number]. Topic: Clinical - Medication Refill >> Apr 06, 2024 10:42 AM Sophia H wrote: Medication: simvastatin  (ZOCOR ) 20 MG tablet **Pt would like to pick up today if possible, advised of 3 business day turnaround just in case**  Has the patient contacted their pharmacy? Yes, pharmacy stated they faxed over with no response.   This is the patient's preferred pharmacy:  [facility] [id number] - [place], [place] - [address] [place] [address] Phone: [phone] Fax: [phone]  Is this the correct pharmacy for this prescription? Yes If no, delete pharmacy and type the correct one.   Has the prescription been filled recently? Yes  Is the patient out of the medication? Yes  Has the patient been seen for an appointment in the last year OR does the patient have an upcoming appointment? Yes, appt 10/23.  Can we respond through MyChart? Yes  Agent: Please be advised that Rx refills may take up to 3 business days. We ask that you follow-up with your pharmacy.

## 2024-04-18 ENCOUNTER — Other Ambulatory Visit: Payer: Medicare HMO

## 2024-04-18 DIAGNOSIS — Z1329 Encounter for screening for other suspected endocrine disorder: Secondary | ICD-10-CM

## 2024-04-18 DIAGNOSIS — Z13220 Encounter for screening for lipoid disorders: Secondary | ICD-10-CM

## 2024-04-18 DIAGNOSIS — M8580 Other specified disorders of bone density and structure, unspecified site: Secondary | ICD-10-CM

## 2024-04-18 DIAGNOSIS — Z0000 Encounter for general adult medical examination without abnormal findings: Secondary | ICD-10-CM

## 2024-04-18 DIAGNOSIS — I10 Essential (primary) hypertension: Secondary | ICD-10-CM

## 2024-04-18 NOTE — Progress Notes (Signed)
 Annual Wellness Visit   Patient Care Team: Sebasthian Stailey, Ronal PARAS, MD as PCP - General (Internal Medicine) Ebbie Cough, MD as Consulting Physician (General Surgery) Odean Potts, MD as Consulting Physician (Hematology and Oncology) Shannon Agent, MD as Consulting Physician (Radiation Oncology)  Visit Date: 04/20/24   Chief Complaint  Patient presents with   Annual Exam   Medicare Wellness   Subjective:  Patient: Annette Tucker, Female DOB: 08-14-1945, 78 y.o. MRN: [id number] Vitals:   04/20/24 1054  BP: 120/80   Annette Tucker is a 78 y.o. Female who presents today for her Annual Wellness Visit. Patient has Mild hypertension; Hyperlipidemia; Osteopenia; Mitral valve prolapse; Osteoarthritis; Back pain; Endometrial ca (HCC); Latex allergy; Bladder dysfunction; Chemotherapy-induced peripheral neuropathy; External hemorrhoid; Ductal carcinoma in situ (DCIS) of right breast; Family history of breast cancer; Family history of bone cancer; and Genetic testing on their problem list.  She travels frequently and wanted medication in case she gets sick while overseas.    History of Hyperlipidemia treated with simvastatin  20 mg daily. 04/18/2024 Lipid Panel Normal.   History of Hypertension treated with Amlodipine  2.5 mg daily. Blood pressure today is normal at 120/80.   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  Biopsy showed DCIS with calcifications intermediate to high-grade.  Tumor was ER positive, PR negative.  She had genetic testing with no pathogenic variants identified.  She is being followed at the cancer center.  She had radiation therapy.  She had right lumpectomy May 30, 2019 by Dr. Ebbie.  She is off of anastrozole, tamoxifen .    History of small retinal hemorrhage followed by Dr. Ginnie Pinal.   History of osteopenia and osteoarthritis.   Stress fracture right foot seen by Dr. Anderson in March 2019.   Some mild  peripheral neuropathy that appears to be chemotherapy-induced.  B12 level has been checked previously.   Was diagnosed with mitral valve prolapse in 2011 on echocardiogram.   She takes vitamin D  supplement.  She took Fosamax for couple of years for osteopenia.   Had tonsillectomy at age 56, TMJ mandibular advancement surgery 1986.     In 2015, she noted vaginal spotting.  She had a D&C and it revealed endometrial cancer.  She was referred to GYN oncologist.  Subsequently underwent robotic assisted TAH/BSO with lymph node dissection.  Lymph nodes were negative.  She had a high-grade serous endometrial carcinoma.  She received radiation treatment.  She has had chemotherapy with Taxol  and carboplatin  and is followed by GYN oncology.     Labs 04/18/2024 WNL  05/26/2023 Mammogram No mammographic evidence of malignancy.    05/17/2023 Bone density Left femur neck BMD 0.611 T-score -2.10   03/11/2015 Colonoscopy Single non-bleeding colonic angioectasia was noted in the cecum. The examined portion of the ileum was normal. The examination was otherwise normal on direct and retroflexion views. No specimens collected.     11/29/2023 Pap smear Negative for Intraepithelial lesion or malignancy glandular cells status post hysterectomy.    Vaccine counseling: Covid-19 vaccine due, Influenza vaccine received today.   Health Maintenance  Topic Date Due   Influenza Vaccine  01/28/2024   COVID-19 Vaccine (9 - 2025-26 season) 02/28/2024   Medicare Annual Wellness (AWV)  04/14/2024   Mammogram  05/25/2024   Colonoscopy  03/10/2025   DTaP/Tdap/Td (5 - Td or Tdap) 06/10/2032   Pneumococcal Vaccine: 50+ Years  Completed   DEXA SCAN  Completed   Zoster Vaccines-  Shingrix  Completed   Meningococcal B Vaccine  Aged Out   Hepatitis C Screening  Discontinued     Review of Systems  Constitutional:  Negative for fever and malaise/fatigue.  HENT:  Negative for congestion.   Eyes:  Negative for blurred  vision.  Respiratory:  Negative for cough and shortness of breath.   Cardiovascular:  Negative for chest pain, palpitations and leg swelling.  Gastrointestinal:  Negative for vomiting.  Musculoskeletal:  Negative for back pain.  Skin:  Negative for rash.  Neurological:  Negative for loss of consciousness and headaches.   Objective:  Vitals: body mass index is 23.05 kg/m. Today's Vitals   04/20/24 1054  BP: 120/80  Pulse: 74  SpO2: 98%  Weight: 126 lb (57.2 kg)  Height: 5' 2 (1.575 m)  PainSc: 0-No pain   Physical Exam Vitals and nursing note reviewed.  Constitutional:      General: She is not in acute distress.    Appearance: Normal appearance. She is not ill-appearing or toxic-appearing.  HENT:     Head: Normocephalic and atraumatic.     Right Ear: Hearing, tympanic membrane, ear canal and external ear normal.     Left Ear: Hearing, tympanic membrane, ear canal and external ear normal.     Mouth/Throat:     Pharynx: Oropharynx is clear.  Eyes:     Extraocular Movements: Extraocular movements intact.     Pupils: Pupils are equal, round, and reactive to light.  Neck:     Thyroid : No thyroid  mass, thyromegaly or thyroid  tenderness.     Vascular: No carotid bruit.  Cardiovascular:     Rate and Rhythm: Normal rate and regular rhythm. No extrasystoles are present.    Pulses:          Dorsalis pedis pulses are 2+ on the right side and 2+ on the left side.     Heart sounds: Normal heart sounds. No murmur heard.    No friction rub. No gallop.  Pulmonary:     Effort: Pulmonary effort is normal.     Breath sounds: Normal breath sounds. No decreased breath sounds, wheezing, rhonchi or rales.  Chest:     Chest wall: No mass.  Abdominal:     Palpations: Abdomen is soft. There is no hepatomegaly, splenomegaly or mass.     Tenderness: There is no abdominal tenderness.     Hernia: No hernia is present.  Genitourinary:    Comments: Deferred to GYN.  Musculoskeletal:      Cervical back: Normal range of motion and neck supple.     Right lower leg: No edema.     Left lower leg: No edema.  Feet:     Comments: Mild residual peripheral neuropathy  Lymphadenopathy:     Cervical: No cervical adenopathy.     Upper Body:     Right upper body: No supraclavicular adenopathy.     Left upper body: No supraclavicular adenopathy.  Skin:    General: Skin is warm and dry.  Neurological:     General: No focal deficit present.     Mental Status: She is alert and oriented to person, place, and time. Mental status is at baseline.     Sensory: Sensation is intact.     Motor: Motor function is intact. No weakness.     Deep Tendon Reflexes: Reflexes are normal and symmetric.  Psychiatric:        Attention and Perception: Attention normal.  Mood and Affect: Mood normal.        Speech: Speech normal.        Behavior: Behavior normal.        Thought Content: Thought content normal.        Cognition and Memory: Cognition normal.        Judgment: Judgment normal.     Current Outpatient Medications  Medication Instructions   amLODipine  (NORVASC ) 2.5 mg, Oral, Daily   ascorbic acid (VITAMIN C) 500 mg, Daily   Calcium Carbonate-Vitamin D  600-400 MG-UNIT tablet 1 tablet, Daily   cholecalciferol (VITAMIN D ) 1,000 Units, 2 times daily   Coenzyme Q10 (CO Q-10) 100 MG CAPS 1 capsule, Every morning   fish oil-omega-3 fatty acids 1 g, Every morning   FLAXSEED, LINSEED, PO 1,000 mg, Daily   levofloxacin  (LEVAQUIN ) 500 mg, Oral, Daily   Magnesium 250 MG TABS 1 tablet, Every evening   Multiple Vitamins-Minerals (MULTIVITAMIN WITH MINERALS) tablet 1 tablet, Every morning   simvastatin  (ZOCOR ) 20 MG tablet TAKE ONE TABLET BY MOUTH EVERY NIGHT AT BEDTIME   vitamin E 400 Units, Every morning   zinc gluconate 50 mg, Daily   Past Medical History:  Diagnosis Date   Endometrial cancer (HCC)    2015   Family history of bone cancer    Family history of breast cancer    Heart  murmur    as child, no problems    Hyperlipidemia    Hypertension    Radiation 08/21/14, 08/28/14, 09/04/14, 09/06/14, 09/18/14   vaginal cuff brachytherapy 30 gray   Seasonal allergies    Medical/Surgical History Narrative:  Allergic/Intolerant to:  Allergies  Allergen Reactions   Latex Rash   Adhesive [Tape]     Skin gets red     Past Surgical History:  Procedure Laterality Date   BREAST LUMPECTOMY WITH RADIOACTIVE SEED LOCALIZATION Right 05/30/2019   Procedure: RIGHT BREAST LUMPECTOMY WITH RADIOACTIVE SEED LOCALIZATION;  Surgeon: Ebbie Cough, MD;  Location: [facility];  Service: General;  Laterality: Right;   BREAST SURGERY     left breast bx - benign   COLONOSCOPY  2017   DILATATION & CURETTAGE/HYSTEROSCOPY WITH TRUECLEAR N/A 05/17/2014   Procedure: HYSTEROSCOPY WITH POLYPECTOMY;  Surgeon: Burnard A. Kandyce, MD;  Location: [facility];  Service: Gynecology;  Laterality: N/A;   LYMPH NODE DISSECTION N/A 06/05/2014   Procedure: LYMPH NODE DISSECTION;  Surgeon: Elenore A. Dodie, MD;  Location: [facility];  Service: Gynecology;  Laterality: N/A;   ROBOTIC ASSISTED TOTAL HYSTERECTOMY WITH BILATERAL SALPINGO OOPHERECTOMY N/A 06/05/2014   Procedure: ROBOTIC ASSISTED TOTAL HYSTERECTOMY WITH BILATERAL SALPINGO OOPHORECTOMY;  Surgeon: Elenore A. Dodie, MD;  Location: [facility];  Service: Gynecology;  Laterality: N/A;   tmj mandibular advancement     TONSILLECTOMY     age 77   WISDOM TOOTH EXTRACTION     Family History  Problem Relation Age of Onset   Mental retardation Mother    Kidney disease Father    Stroke Father    Cancer Maternal Uncle        unknown   Breast cancer Maternal Aunt        dx 37s   Bone cancer Paternal Uncle    Breast cancer Cousin        pat cousins x2, dx in their 71s   Bone cancer Maternal Uncle    Family History Narrative: Mother with history of Alzheimer's dementia. Father died at age 750 with kidney disease and had history of  MI, hypertension and CVA.  1 brother with hyperlipidemia.     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 She is a native of [place]  and is single. Does not smoke or consume alcohol.   Most Recent Health Risks Assessment:   Most Recent Social Determinants of Health (Including Hx of Tobacco, Alcohol, and Drug Use) SDOH Screenings   Food Insecurity: No Food Insecurity (04/16/2024)  Housing: Unknown (04/16/2024)  Transportation Needs: No Transportation Needs (04/16/2024)  Utilities: Not At Risk (04/14/2023)  Alcohol Screen: Low Risk  (04/09/2020)  Depression (PHQ2-9): Low Risk  (10/16/2022)  Financial Resource Strain: Low Risk  (04/16/2024)  Physical Activity: Insufficiently Active (04/16/2024)  Social Connections: Moderately Integrated (04/16/2024)  Stress: No Stress Concern Present (04/16/2024)  Tobacco Use: Low Risk  (04/20/2024)  Health Literacy: Adequate Health Literacy (04/14/2023)   Social History   Tobacco Use   Smoking status: Never   Smokeless tobacco: Never  Vaping Use   Vaping status: Never Used  Substance Use Topics   Alcohol use: Not Currently    Comment: rarely   Drug use: No   Most Recent Functional Status Assessment:    04/16/2024    4:18 PM  In your present state of health, do you have any difficulty performing the following activities:  Hearing? 0  Vision? 0  Difficulty concentrating or making decisions? 0  Walking or climbing stairs? 0  Dressing or bathing? 0  Doing errands, shopping? 0  Preparing Food and eating ? N  Using the Toilet? N  In the past six months, have you accidently leaked urine? N  Do you have problems with loss of bowel control? N  Managing your Medications? N  Managing your Finances? N  Housekeeping or managing your Housekeeping? N   Most Recent Fall Risk Assessment:    04/20/2024   10:54 AM  Fall Risk   Falls in the  past year? 0  Number falls in past yr: 0  Injury with Fall? 0  Risk for fall due to : No Fall Risks  Follow up Education provided;Falls evaluation completed;Falls prevention discussed   Most Recent Anxiety/Depression Screenings:    10/16/2022   10:41 AM 04/13/2022    9:53 AM  PHQ 2/9 Scores  PHQ - 2 Score 0 0    Most Recent Cognitive Screening:    04/20/2024   10:53 AM  6CIT Screen  What Year? 0 points  What month? 0 points  What time? 0 points  Count back from 20 0 points   Most Recent Vision/Hearing Screenings:No results found. Results:  Studies Obtained And Personally Reviewed By Me: Diabetic Foot Exam - Simple   No data filed     05/26/2023 Mammogram No mammographic evidence of malignancy.    05/17/2023 Bone density Left femur neck BMD 0.611 T-score -2.10   03/11/2015 Colonoscopy Single non-bleeding colonic angioectasia was noted in the cecum. The examined portion of the ileum was normal. The examination was otherwise normal on direct and retroflexion views. No specimens collected.   11/29/2023 Pap smear Negative for Intraepithelial lesion or malignancy glandular cells status post hysterectomy.   Labs:  CBC w/ Differential Lab Results  Component Value Date   WBC 4.7 04/18/2024   RBC 4.83 04/18/2024   HGB 14.8 04/18/2024   HCT 45.0 04/18/2024   PLT 259 04/18/2024   MCV 93.2 04/18/2024   MCH  30.6 04/18/2024   MCHC 32.9 04/18/2024   RDW 12.5 04/18/2024   MPV 11.3 04/18/2024   LYMPHSABS 1,084 04/09/2022   MONOABS 0.5 05/17/2019   BASOSABS 42 04/18/2024    Comprehensive Metabolic Panel Lab Results  Component Value Date   NA 140 04/18/2024   K 4.9 04/18/2024   CL 102 04/18/2024   CO2 29 04/18/2024   GLUCOSE 94 04/18/2024   BUN 13 04/18/2024   CREATININE 0.69 04/18/2024   CALCIUM 9.8 04/18/2024   PROT 7.6 04/18/2024   ALBUMIN 4.5 05/17/2019   AST 21 04/18/2024   ALT 19 04/18/2024   ALKPHOS 76 05/17/2019   BILITOT 0.6 04/18/2024   EGFR 90  04/18/2024   GFRNONAA 78 04/04/2020   Lipid Panel  Lab Results  Component Value Date   CHOL 183 04/18/2024   HDL 79 04/18/2024   LDLCALC 85 04/18/2024   TRIG 95 04/18/2024   A1c No results found for: HGBA1C  TSH Lab Results  Component Value Date   TSH 4.00 04/18/2024    Results for orders placed or performed in visit on 04/20/24  POCT URINALYSIS DIP (CLINITEK)  Result Value Ref Range   Color, UA yellow yellow   Clarity, UA clear clear   Glucose, UA negative negative mg/dL   Bilirubin, UA negative negative   Ketones, POC UA negative negative mg/dL   Spec Grav, UA 8.984 8.989 - 1.025   Blood, UA negative negative   pH, UA 6.5 5.0 - 8.0   POC PROTEIN,UA negative negative, trace   Urobilinogen, UA 0.2 0.2 or 1.0 E.U./dL   Nitrite, UA Negative Negative   Leukocytes, UA Negative Negative   Assessment & Plan:   Orders Placed This Encounter  Procedures   POCT URINALYSIS DIP (CLINITEK)   Meds ordered this encounter  Medications   levofloxacin  (LEVAQUIN ) 500 MG tablet    Sig: Take 1 tablet (500 mg total) by mouth daily for 7 days.    Dispense:  7 tablet    Refill:  0   She travels frequently and wanted medication in case she gets sick while [place].    Levothyroxine 500 mg daily prescribed.   Hyperlipidemia: treated with simvastatin  20 mg daily. 04/18/2024 Lipid Panel Normal.   Hypertension: treated with Amlodipine  2.5 mg daily. Blood pressure today is normal at 120/80.   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  Biopsy showed DCIS with calcifications intermediate to high-grade.  Tumor was ER positive, PR negative.  She had genetic testing with no pathogenic variants identified.  She is being followed at the cancer center.  She had radiation therapy.  She had right lumpectomy May 30, 2019 by Dr. Ebbie.  She is off of anastrozole, tamoxifen .    History of osteopenia and osteoarthritis.   Some mild peripheral  neuropathy that appears to be chemotherapy-induced.  B12 level has been checked previously.    She takes vitamin D  supplement.  She took Fosamax for couple of years for osteopenia.    In 2015, she noted vaginal spotting.  She had a D&C and it revealed endometrial cancer.  She was referred to GYN oncologist.  Subsequently underwent robotic assisted TAH/BSO with lymph node dissection.  Lymph nodes were negative.  She had a high-grade serous endometrial carcinoma.  She received radiation treatment.  She has had chemotherapy with Taxol  and carboplatin  and is followed by GYN oncology.   05/26/2023 Mammogram No mammographic evidence of malignancy.  05/17/2023 Bone density Left femur neck BMD 0.611 T-score -2.10   03/11/2015 Colonoscopy Single non-bleeding colonic angioectasia was noted in the cecum. The examined portion of the ileum was normal. The examination was otherwise normal on direct and retroflexion views. No specimens collected.     11/29/2023 Pap smear Negative for Intraepithelial lesion or malignancy glandular cells status post hysterectomy.    Vaccine counseling: Covid-19 vaccine due, Influenza vaccine received today.    Annual Wellness Visit done today including the all of the following: Reviewed patient's Family Medical History Reviewed patient's SDOH and reviewed tobacco, alcohol, and drug use.  Reviewed and updated list of patient's medical providers Assessment of cognitive impairment was done Assessed patient's functional ability Established a written schedule for health screening services Health Risk Assessent Completed and Reviewed  Discussed health benefits of physical activity, and encouraged her to engage in regular exercise appropriate for her age and condition.   I,Makayla C Reid,acting as a scribe for Ronal JINNY Hailstone, MD.,have documented all relevant documentation on the behalf of Ronal JINNY Hailstone, MD,as directed by  Ronal JINNY Hailstone, MD while in the presence of Ronal JINNY Hailstone,  MD.   I, Ronal JINNY Hailstone, MD, have reviewed all documentation for and agree with the above Annual Wellness Visit documentation.  Ronal JINNY Hailstone, MD Internal Medicine 04/20/2024

## 2024-04-19 LAB — CBC WITH DIFFERENTIAL/PLATELET
Absolute Lymphocytes: 1100 {cells}/uL (ref 850–3900)
Absolute Monocytes: 484 {cells}/uL (ref 200–950)
Basophils Absolute: 42 {cells}/uL (ref 0–200)
Basophils Relative: 0.9 %
Eosinophils Absolute: 113 {cells}/uL (ref 15–500)
Eosinophils Relative: 2.4 %
HCT: 45 % (ref 35.0–45.0)
Hemoglobin: 14.8 g/dL (ref 11.7–15.5)
MCH: 30.6 pg (ref 27.0–33.0)
MCHC: 32.9 g/dL (ref 32.0–36.0)
MCV: 93.2 fL (ref 80.0–100.0)
MPV: 11.3 fL (ref 7.5–12.5)
Monocytes Relative: 10.3 %
Neutro Abs: 2961 {cells}/uL (ref 1500–7800)
Neutrophils Relative %: 63 %
Platelets: 259 Thousand/uL (ref 140–400)
RBC: 4.83 Million/uL (ref 3.80–5.10)
RDW: 12.5 % (ref 11.0–15.0)
Total Lymphocyte: 23.4 %
WBC: 4.7 Thousand/uL (ref 3.8–10.8)

## 2024-04-19 LAB — COMPREHENSIVE METABOLIC PANEL WITH GFR
AG Ratio: 1.7 (calc) (ref 1.0–2.5)
ALT: 19 U/L (ref 6–29)
AST: 21 U/L (ref 10–35)
Albumin: 4.8 g/dL (ref 3.6–5.1)
Alkaline phosphatase (APISO): 88 U/L (ref 37–153)
BUN: 13 mg/dL (ref 7–25)
CO2: 29 mmol/L (ref 20–32)
Calcium: 9.8 mg/dL (ref 8.6–10.4)
Chloride: 102 mmol/L (ref 98–110)
Creat: 0.69 mg/dL (ref 0.60–1.00)
Globulin: 2.8 g/dL (ref 1.9–3.7)
Glucose, Bld: 94 mg/dL (ref 65–99)
Potassium: 4.9 mmol/L (ref 3.5–5.3)
Sodium: 140 mmol/L (ref 135–146)
Total Bilirubin: 0.6 mg/dL (ref 0.2–1.2)
Total Protein: 7.6 g/dL (ref 6.1–8.1)
eGFR: 90 mL/min/1.73m2 (ref >=60)

## 2024-04-19 LAB — LIPID PANEL
Cholesterol: 183 mg/dL (ref <200)
HDL: 79 mg/dL (ref >=50)
LDL Cholesterol (Calc): 85 mg/dL
Non-HDL Cholesterol (Calc): 104 mg/dL (ref <130)
Total CHOL/HDL Ratio: 2.3 (calc) (ref <5.0)
Triglycerides: 95 mg/dL (ref <150)

## 2024-04-19 LAB — TSH: TSH: 4 m[IU]/L (ref 0.40–4.50)

## 2024-04-20 ENCOUNTER — Ambulatory Visit: Payer: Medicare HMO | Admitting: Internal Medicine

## 2024-04-20 ENCOUNTER — Encounter: Payer: Self-pay | Admitting: Internal Medicine

## 2024-04-20 VITALS — BP 120/80 | HR 74 | Ht 62.0 in | Wt 126.0 lb

## 2024-04-20 DIAGNOSIS — G620 Drug-induced polyneuropathy: Secondary | ICD-10-CM

## 2024-04-20 DIAGNOSIS — Z823 Family history of stroke: Secondary | ICD-10-CM

## 2024-04-20 DIAGNOSIS — E785 Hyperlipidemia, unspecified: Secondary | ICD-10-CM

## 2024-04-20 DIAGNOSIS — Z0000 Encounter for general adult medical examination without abnormal findings: Secondary | ICD-10-CM | POA: Diagnosis not present

## 2024-04-20 DIAGNOSIS — Z23 Encounter for immunization: Secondary | ICD-10-CM

## 2024-04-20 DIAGNOSIS — Z86000 Personal history of in-situ neoplasm of breast: Secondary | ICD-10-CM

## 2024-04-20 DIAGNOSIS — Z7184 Encounter for health counseling related to travel: Secondary | ICD-10-CM

## 2024-04-20 DIAGNOSIS — I10 Essential (primary) hypertension: Secondary | ICD-10-CM

## 2024-04-20 DIAGNOSIS — Z8542 Personal history of malignant neoplasm of other parts of uterus: Secondary | ICD-10-CM

## 2024-04-20 DIAGNOSIS — M8580 Other specified disorders of bone density and structure, unspecified site: Secondary | ICD-10-CM

## 2024-04-20 LAB — POCT URINALYSIS DIP (CLINITEK)
Bilirubin, UA: NEGATIVE
Blood, UA: NEGATIVE
Glucose, UA: NEGATIVE mg/dL
Ketones, POC UA: NEGATIVE mg/dL
Leukocytes, UA: NEGATIVE
Nitrite, UA: NEGATIVE
POC PROTEIN,UA: NEGATIVE
Spec Grav, UA: 1.015 (ref 1.010–1.025)
Urobilinogen, UA: 0.2 U/dL
pH, UA: 6.5 (ref 5.0–8.0)

## 2024-04-20 MED ORDER — LEVOFLOXACIN 500 MG PO TABS: 500.0000 mg | ORAL_TABLET | Freq: Every day | ORAL | 0 refills | Status: AC

## 2024-04-20 NOTE — Progress Notes (Signed)
 Subjective:   Annette Tucker is a 78 y.o. female who presents for Medicare Annual (Subsequent) preventive examination.  Visit Complete: In person  Patient Medicare AWV questionnaire was completed by the patient on 04/20/2024; I have confirmed that all information answered by patient is correct and no changes since this date.  Cardiac Risk Factors include: advanced age (>50men, >54 women);dyslipidemia;hypertension     Objective:    Today's Vitals   04/20/24 1054 04/20/24 1600  BP: 120/80   Pulse: 74   SpO2: 98%   Weight: 126 lb (57.2 kg)   Height: 5' 2 (1.575 m)   PainSc: 0-No pain 0-No pain   Body mass index is 23.05 kg/m.     04/20/2024    4:07 PM 04/15/2023    9:50 AM 04/13/2022    9:53 AM 04/10/2021   10:08 AM 08/31/2019    9:27 AM 06/12/2019    8:18 AM 05/30/2019   11:08 AM  Advanced Directives  Does Patient Have a Medical Advance Directive? Yes Yes Yes Yes Yes Yes Yes  Type of Advance Directive Living will;Healthcare Power of [employer] Power of [place];Living will Healthcare Power of [place];Living will Healthcare Power of [place];Living will Healthcare Power of [place];Living will Healthcare Power of [place];Living will Healthcare Power of [place];Living will  Does patient want to make changes to medical advance directive?   No - Patient declined No - Patient declined   No - Patient declined  Copy of Healthcare Power of Attorney in Chart? No - copy requested No - copy requested  No - copy requested No - copy requested No - copy requested No - copy requested    Current Medications (verified) Outpatient Encounter Medications as of 04/20/2024  Medication Sig   amLODipine  (NORVASC ) 2.5 MG tablet Take 1 tablet (2.5 mg total) by mouth daily.   ascorbic acid (VITAMIN C) 500 MG tablet Take 500 mg by mouth daily.   Calcium Carbonate-Vitamin D  600-400 MG-UNIT tablet Take 1 tablet by mouth daily. @lunch  and in the evening   cholecalciferol (VITAMIN D )  1000 UNITS tablet Take 1,000 Units by mouth 2 (two) times daily.    Coenzyme Q10 (CO Q-10) 100 MG CAPS Take 1 capsule by mouth every morning.    fish oil-omega-3 fatty acids 1000 MG capsule Take 1 g by mouth every morning.   FLAXSEED, LINSEED, PO Take 1,000 mg by mouth daily.   levofloxacin  (LEVAQUIN ) 500 MG tablet Take 1 tablet (500 mg total) by mouth daily for 7 days.   Magnesium 250 MG TABS Take 1 tablet by mouth every evening.   Multiple Vitamins-Minerals (MULTIVITAMIN WITH MINERALS) tablet Take 1 tablet by mouth every morning.   simvastatin  (ZOCOR ) 20 MG tablet TAKE ONE TABLET BY MOUTH EVERY NIGHT AT BEDTIME   vitamin E 400 UNIT capsule Take 400 Units by mouth every morning.   zinc gluconate 50 MG tablet Take 50 mg by mouth daily.   No facility-administered encounter medications on file as of 04/20/2024.    Allergies (verified) Latex and Adhesive [tape]   History: Past Medical History:  Diagnosis Date   Endometrial cancer (HCC)    2015   Family history of bone cancer    Family history of breast cancer    Heart murmur    as child, no problems    Hyperlipidemia    Hypertension    Radiation 08/21/14, 08/28/14, 09/04/14, 09/06/14, 09/18/14   vaginal cuff brachytherapy 30 gray   Seasonal allergies    Past Surgical History:  Procedure Laterality Date   BREAST LUMPECTOMY WITH RADIOACTIVE SEED LOCALIZATION Right 05/30/2019   Procedure: RIGHT BREAST LUMPECTOMY WITH RADIOACTIVE SEED LOCALIZATION;  Surgeon: Ebbie Cough, MD;  Location: [facility];  Service: General;  Laterality: Right;   BREAST SURGERY     left breast bx - benign   COLONOSCOPY  2017   DILATATION & CURETTAGE/HYSTEROSCOPY WITH TRUECLEAR N/A 05/17/2014   Procedure: HYSTEROSCOPY WITH POLYPECTOMY;  Surgeon: Burnard A. Kandyce, MD;  Location: [facility];  Service: Gynecology;  Laterality: N/A;   LYMPH NODE DISSECTION N/A 06/05/2014   Procedure: LYMPH NODE DISSECTION;  Surgeon: Elenore A. Dodie, MD;  Location: [facility];  Service: Gynecology;  Laterality: N/A;   ROBOTIC ASSISTED TOTAL HYSTERECTOMY WITH BILATERAL SALPINGO OOPHERECTOMY N/A 06/05/2014   Procedure: ROBOTIC ASSISTED TOTAL HYSTERECTOMY WITH BILATERAL SALPINGO OOPHORECTOMY;  Surgeon: Elenore A. Dodie, MD;  Location: [facility];  Service: Gynecology;  Laterality: N/A;   tmj mandibular advancement     TONSILLECTOMY     age 6   WISDOM TOOTH EXTRACTION     Family History  Problem Relation Age of Onset   Mental retardation Mother    Kidney disease Father    Stroke Father    Cancer Maternal Uncle        unknown   Breast cancer Maternal Aunt        dx 23s   Bone cancer Paternal Uncle    Breast cancer Cousin        pat cousins x2, dx in their 43s   Bone cancer Maternal Uncle    Social History   Socioeconomic History   Marital status: Single    Spouse name: Not on file   Number of children: 0   Years of education: Not on file   Highest education level: Bachelor's degree (e.g., BA, AB, BS)  Occupational History   Occupation: retired engineering geologist   Tobacco Use   Smoking status: Never   Smokeless tobacco: Never  Vaping Use   Vaping status: Never Used  Substance and Sexual Activity   Alcohol use: Not Currently    Comment: rarely   Drug use: No   Sexual activity: Never    Birth control/protection: Post-menopausal  Other Topics Concern   Not on file  Social History Narrative   Not on file   Social Drivers of Health   Financial Resource Strain: Low Risk  (04/20/2024)   Overall Financial Resource Strain (CARDIA)    Difficulty of Paying Living Expenses: Not very hard  Food Insecurity: No Food Insecurity (04/20/2024)   Hunger Vital Sign    Worried About Running Out of Food in the Last Year: Never true    Ran Out of Food in the Last Year: Never true  Transportation Needs: No Transportation Needs (04/20/2024)   PRAPARE - Administrator, Civil Service (Medical): No    Lack of Transportation (Non-Medical): No  Physical Activity:  Insufficiently Active (04/20/2024)   Exercise Vital Sign    Days of Exercise per Week: 3 days    Minutes of Exercise per Session: 30 min  Stress: No Stress Concern Present (04/20/2024)   [employer] of Occupational Health - Occupational Stress Questionnaire    Feeling of Stress: Not at all  Social Connections: Socially Integrated (04/20/2024)   Social Connection and Isolation Panel    Frequency of Communication with Friends and Family: Three times a week    Frequency of Social Gatherings with Friends and Family: Three times a week  Attends Religious Services: More than 4 times per year    Active Member of Clubs or Organizations: Yes    Attends Banker Meetings: More than 4 times per year    Marital Status: Married    Tobacco Counseling Counseling given: Not Answered   Clinical Intake:  Pre-visit preparation completed: Yes  Pain : No/denies pain Pain Score: 0-No pain     BMI - recorded: 23.05 Nutritional Status: BMI of 19-24  Normal Nutritional Risks: None Diabetes: No  How often do you need to have someone help you when you read instructions, pamphlets, or other written materials from your doctor or pharmacy?: 1 - Never  Interpreter Needed?: No  Information entered by :: Kathlynn Porto, CMA   Activities of Daily Living    04/20/2024    4:02 PM 04/16/2024    4:18 PM  In your present state of health, do you have any difficulty performing the following activities:  Hearing? 0 0  Vision? 0 0  Difficulty concentrating or making decisions? 0 0  Walking or climbing stairs? 0 0  Dressing or bathing?  0  Doing errands, shopping? 0 0  Preparing Food and eating ? N N  Using the Toilet? N N  In the past six months, have you accidently leaked urine? N N  Do you have problems with loss of bowel control? N N  Managing your Medications? N N  Managing your Finances? N N  Housekeeping or managing your Housekeeping? N N    Patient Care Team: Perri Ronal PARAS, MD as PCP - General (Internal Medicine) Ebbie Cough, MD as Consulting Physician (General Surgery) Odean Potts, MD as Consulting Physician (Hematology and Oncology) Shannon Agent, MD as Consulting Physician (Radiation Oncology)  Indicate any recent Medical Services you may have received from other than [facility] providers in the past year (date may be approximate).     Assessment:   This is a routine wellness examination for [place].  Hearing/Vision screen No results found.   Goals Addressed   None    Depression Screen    04/20/2024    4:08 PM 10/16/2022   10:41 AM 04/13/2022    9:53 AM 04/10/2021   10:09 AM 04/09/2020   10:08 AM 04/06/2019   11:01 AM 04/04/2018    9:51 AM  PHQ 2/9 Scores  PHQ - 2 Score 0 0 0 0 0 0 0    Fall Risk    04/20/2024   10:54 AM 04/16/2024    4:18 PM 04/15/2023    9:45 AM 04/14/2023    3:48 PM 10/16/2022   10:41 AM  Fall Risk   Falls in the past year? 0 0 0 0 0  Number falls in past yr: 0  0  0  Injury with Fall? 0  0 0 0  Risk for fall due to : No Fall Risks  No Fall Risks  No Fall Risks  Follow up Education provided;Falls evaluation completed;Falls prevention discussed  Falls evaluation completed  Falls prevention discussed    MEDICARE RISK AT HOME: Medicare Risk at Home Any stairs in or around the home?: Yes If so, are there any without handrails?: Yes Home free of loose throw rugs in walkways, pet beds, electrical cords, etc?: Yes Adequate lighting in your home to reduce risk of falls?: Yes Life alert?: No Use of a cane, walker or w/c?: No Grab bars in the bathroom?: No Shower chair or bench in shower?: No Elevated toilet seat or a handicapped  toilet?: Yes  TIMED UP AND GO:  Was the test performed?  No    Cognitive Function:        04/20/2024   10:53 AM 04/15/2023    9:45 AM 04/13/2022    9:55 AM 04/10/2021   10:11 AM  6CIT Screen  What Year? 0 points 0 points 0 points 0 points  What month? 0 points 0 points 0  points 0 points  What time? 0 points 0 points 0 points 0 points  Count back from 20 0 points 0 points 0 points 0 points  Months in reverse  0 points 0 points 0 points  Repeat phrase  0 points 0 points 0 points  Total Score  0 points 0 points 0 points    Immunizations Immunization History  Administered Date(s) Administered   Fluad  Quad(high Dose 65+) 04/15/2022   INFLUENZA, HIGH DOSE SEASONAL PF 04/20/2024   Influenza Split 03/25/2011, 03/30/2012   Influenza,inj,Quad PF,6+ Mos 03/14/2013, 04/11/2014, 04/24/2015, 03/12/2016, 03/25/2017, 04/04/2018, 04/06/2019   PFIZER(Purple Top)SARS-COV-2 Vaccination 08/05/2019, 08/26/2019, 05/28/2020, 01/04/2021   PNEUMOCOCCAL CONJUGATE-20 10/16/2022   Pfizer Covid-19 Vaccine Bivalent Booster 40yrs & up 11/19/2021   Pfizer(Comirnaty )Fall Seasonal Vaccine 12 years and older 11/24/2022   Pneumococcal Conjugate-13 04/24/2015   Pneumococcal Polysaccharide-23 11/08/2001, 03/12/2016   Tdap 02/05/2004, 09/29/2011, 06/10/2022   Tetanus 06/30/2011   Zoster Recombinant(Shingrix) 10/27/2021, 02/27/2022   Zoster, Live 03/28/2010   Zoster, Unspecified 09/27/2021    TDAP status: Up to date  Flu Vaccine status: Completed at today's visit  Pneumococcal vaccine status: Up to date  Covid-19 vaccine status: Information provided on how to obtain vaccines.   Qualifies for Shingles Vaccine? Yes   Zostavax completed Yes   Shingrix Completed?: Yes  Screening Tests Health Maintenance  Topic Date Due   COVID-19 Vaccine (9 - 2025-26 season) 02/28/2024   Medicare Annual Wellness (AWV)  04/14/2024   Mammogram  05/25/2024   Colonoscopy  03/10/2025   DTaP/Tdap/Td (5 - Td or Tdap) 06/10/2032   Pneumococcal Vaccine: 50+ Years  Completed   Influenza Vaccine  Completed   DEXA SCAN  Completed   Zoster Vaccines- Shingrix  Completed   Meningococcal B Vaccine  Aged Out   Hepatitis C Screening  Discontinued    Health Maintenance  Health Maintenance Due  Topic Date  Due   COVID-19 Vaccine (9 - 2025-26 season) 02/28/2024   Medicare Annual Wellness (AWV)  04/14/2024   Mammogram  05/25/2024    Colorectal cancer screening: Type of screening: Colonoscopy. Completed 03/11/2015. Repeat every 10 years  Mammogram status: Completed 05/26/2023. Repeat every year  Bone Density status: Completed 05/17/2023. Results reflect: Bone density results: OSTEOPENIA. Repeat every 2 years.  Lung Cancer Screening: (Low Dose CT Chest recommended if Age 7-80 years, 20 pack-year currently smoking OR have quit w/in 15years.) does not qualify.   Additional Screening:  Hepatitis C Screening: does not qualify; Completed   Vision Screening: Recommended annual ophthalmology exams for early detection of glaucoma and other disorders of the eye. Is the patient up to date with their annual eye exam?  Yes  Who is the provider or what is the name of the office in which the patient attends annual eye exams? [facility] If pt is not established with a provider, would they like to be referred to a provider to establish care? No .   Dental Screening: Recommended annual dental exams for proper oral hygiene  Community Resource Referral / Chronic Care Management: CRR required this visit?  No  CCM required this visit?  No     Plan:     I have personally reviewed and noted the following in the patient's chart:   Medical and social history Use of alcohol, tobacco or illicit drugs  Current medications and supplements including opioid prescriptions. Patient is not currently taking opioid prescriptions. Functional ability and status Nutritional status Physical activity Advanced directives List of other physicians Hospitalizations, surgeries, and ER visits in previous 12 months Vitals Screenings to include cognitive, depression, and falls Referrals and appointments  In addition, I have reviewed and discussed with patient certain preventive protocols, quality metrics, and best  practice recommendations. A written personalized care plan for preventive services as well as general preventive health recommendations were provided to patient.     Shephanie Romas Zelda, CMA   04/20/2024   After Visit Summary: (In Person-Printed) AVS printed and given to the patient  I, Ronal JINNY Hailstone, MD, have reviewed all documentation for this visit. The documentation on 04/20/2024 for the exam, diagnosis, procedures, and orders are all accurate and complete.

## 2024-04-25 ENCOUNTER — Encounter: Payer: Self-pay | Admitting: Internal Medicine

## 2024-04-25 NOTE — Patient Instructions (Addendum)
 It was a pleasure to see you today. Flu vaccine given. Labs are stable. Levaquin  prescribed for travel if needed. Return in one year or as needed.

## 2024-05-11 DIAGNOSIS — H5203 Hypermetropia, bilateral: Secondary | ICD-10-CM | POA: Diagnosis not present

## 2024-05-22 DIAGNOSIS — Z1231 Encounter for screening mammogram for malignant neoplasm of breast: Secondary | ICD-10-CM | POA: Diagnosis not present

## 2024-05-22 LAB — HM MAMMOGRAPHY

## 2024-05-23 ENCOUNTER — Encounter: Payer: Self-pay | Admitting: Internal Medicine

## 2024-08-01 ENCOUNTER — Encounter: Payer: Self-pay | Admitting: *Deleted

## 2024-08-01 NOTE — Progress Notes (Signed)
 Annette Tucker                                          MRN: [id number]   08/01/2024   The [facility] Quality Team Specialist reviewed this patient medical record for the purposes of chart review for care gap closure. The following were reviewed: abstraction for care gap closure-controlling blood pressure.    [facility] Quality Team

## 2025-04-24 ENCOUNTER — Other Ambulatory Visit: Payer: Self-pay

## 2025-04-26 ENCOUNTER — Ambulatory Visit: Payer: Self-pay | Admitting: Internal Medicine
